# Patient Record
Sex: Male | Born: 1950 | Race: White | Hispanic: No | Marital: Married | Smoking: Never smoker
Health system: Southern US, Community
[De-identification: ages and names within clinical notes are randomized; demographics above are authoritative.]

## PROBLEM LIST (undated history)

## (undated) DIAGNOSIS — D689 Coagulation defect, unspecified: Secondary | ICD-10-CM

## (undated) DIAGNOSIS — Z9889 Other specified postprocedural states: Secondary | ICD-10-CM

## (undated) DIAGNOSIS — I10 Essential (primary) hypertension: Secondary | ICD-10-CM

## (undated) DIAGNOSIS — M1990 Unspecified osteoarthritis, unspecified site: Secondary | ICD-10-CM

## (undated) DIAGNOSIS — G4730 Sleep apnea, unspecified: Secondary | ICD-10-CM

## (undated) DIAGNOSIS — H269 Unspecified cataract: Secondary | ICD-10-CM

## (undated) DIAGNOSIS — C9000 Multiple myeloma not having achieved remission: Secondary | ICD-10-CM

## (undated) DIAGNOSIS — I4891 Unspecified atrial fibrillation: Secondary | ICD-10-CM

## (undated) DIAGNOSIS — I454 Nonspecific intraventricular block: Secondary | ICD-10-CM

## (undated) DIAGNOSIS — I951 Orthostatic hypotension: Secondary | ICD-10-CM

## (undated) DIAGNOSIS — R112 Nausea with vomiting, unspecified: Secondary | ICD-10-CM

## (undated) HISTORY — DX: Unspecified cataract: H26.9

## (undated) HISTORY — PX: WRIST RECONSTRUCTION: SHX2675

## (undated) HISTORY — DX: Nonspecific intraventricular block: I45.4

## (undated) HISTORY — DX: Unspecified atrial fibrillation: I48.91

## (undated) HISTORY — DX: Orthostatic hypotension: I95.1

## (undated) HISTORY — DX: Sleep apnea, unspecified: G47.30

## (undated) HISTORY — PX: FOOT SURGERY: SHX648

## (undated) HISTORY — PX: CHOLECYSTECTOMY: SHX55

## (undated) HISTORY — DX: Multiple myeloma not having achieved remission: C90.00

## (undated) HISTORY — DX: Nausea with vomiting, unspecified: R11.2

## (undated) HISTORY — PX: KNEE SURGERY: SHX244

## (undated) HISTORY — DX: Other specified postprocedural states: Z98.890

## (undated) HISTORY — PX: WISDOM TOOTH EXTRACTION: SHX21

## (undated) HISTORY — PX: CATARACT EXTRACTION, BILATERAL: SHX1313

## (undated) HISTORY — DX: Essential (primary) hypertension: I10

## (undated) HISTORY — DX: Unspecified osteoarthritis, unspecified site: M19.90

## (undated) HISTORY — DX: Coagulation defect, unspecified: D68.9

## (undated) HISTORY — PX: HAMMER TOE SURGERY: SHX385

## (undated) HISTORY — PX: LAPAROSCOPIC GASTRIC BANDING: SHX1100

---

## 2015-01-25 ENCOUNTER — Other Ambulatory Visit: Payer: Self-pay | Admitting: Physician Assistant

## 2015-01-25 ENCOUNTER — Ambulatory Visit
Admission: RE | Admit: 2015-01-25 | Discharge: 2015-01-25 | Disposition: A | Discharge status: Home / Self Care | Payer: 59 | Source: Ambulatory Visit | Attending: Physician Assistant | Admitting: Physician Assistant

## 2015-01-25 DIAGNOSIS — Z9884 Bariatric surgery status: Secondary | ICD-10-CM

## 2015-11-04 HISTORY — PX: POLYPECTOMY: SHX149

## 2015-11-04 HISTORY — PX: COLONOSCOPY: SHX174

## 2016-04-25 DIAGNOSIS — E782 Mixed hyperlipidemia: Secondary | ICD-10-CM | POA: Diagnosis not present

## 2016-04-25 DIAGNOSIS — I10 Essential (primary) hypertension: Secondary | ICD-10-CM | POA: Diagnosis not present

## 2016-04-25 DIAGNOSIS — E038 Other specified hypothyroidism: Secondary | ICD-10-CM | POA: Diagnosis not present

## 2016-04-25 DIAGNOSIS — Z125 Encounter for screening for malignant neoplasm of prostate: Secondary | ICD-10-CM | POA: Diagnosis not present

## 2016-04-25 DIAGNOSIS — Z1211 Encounter for screening for malignant neoplasm of colon: Secondary | ICD-10-CM | POA: Diagnosis not present

## 2016-04-25 DIAGNOSIS — Z1389 Encounter for screening for other disorder: Secondary | ICD-10-CM | POA: Diagnosis not present

## 2016-04-25 DIAGNOSIS — R7301 Impaired fasting glucose: Secondary | ICD-10-CM | POA: Diagnosis not present

## 2016-04-25 DIAGNOSIS — E559 Vitamin D deficiency, unspecified: Secondary | ICD-10-CM | POA: Diagnosis not present

## 2016-04-25 DIAGNOSIS — D518 Other vitamin B12 deficiency anemias: Secondary | ICD-10-CM | POA: Diagnosis not present

## 2016-06-19 DIAGNOSIS — Z1211 Encounter for screening for malignant neoplasm of colon: Secondary | ICD-10-CM | POA: Diagnosis not present

## 2016-06-19 DIAGNOSIS — Z800 Family history of malignant neoplasm of digestive organs: Secondary | ICD-10-CM | POA: Diagnosis not present

## 2016-06-19 DIAGNOSIS — Z86010 Personal history of colonic polyps: Secondary | ICD-10-CM | POA: Diagnosis not present

## 2016-06-30 DIAGNOSIS — D123 Benign neoplasm of transverse colon: Secondary | ICD-10-CM | POA: Diagnosis not present

## 2016-06-30 DIAGNOSIS — E669 Obesity, unspecified: Secondary | ICD-10-CM | POA: Diagnosis not present

## 2016-06-30 DIAGNOSIS — Z1211 Encounter for screening for malignant neoplasm of colon: Secondary | ICD-10-CM | POA: Diagnosis not present

## 2016-06-30 DIAGNOSIS — D128 Benign neoplasm of rectum: Secondary | ICD-10-CM | POA: Diagnosis not present

## 2016-06-30 DIAGNOSIS — Z86010 Personal history of colonic polyps: Secondary | ICD-10-CM | POA: Diagnosis not present

## 2016-06-30 DIAGNOSIS — K5730 Diverticulosis of large intestine without perforation or abscess without bleeding: Secondary | ICD-10-CM | POA: Diagnosis not present

## 2016-06-30 DIAGNOSIS — Z9049 Acquired absence of other specified parts of digestive tract: Secondary | ICD-10-CM | POA: Diagnosis not present

## 2016-06-30 DIAGNOSIS — Z6839 Body mass index (BMI) 39.0-39.9, adult: Secondary | ICD-10-CM | POA: Diagnosis not present

## 2016-06-30 DIAGNOSIS — D124 Benign neoplasm of descending colon: Secondary | ICD-10-CM | POA: Diagnosis not present

## 2016-06-30 DIAGNOSIS — Z800 Family history of malignant neoplasm of digestive organs: Secondary | ICD-10-CM | POA: Diagnosis not present

## 2016-06-30 DIAGNOSIS — Z79899 Other long term (current) drug therapy: Secondary | ICD-10-CM | POA: Diagnosis not present

## 2016-06-30 DIAGNOSIS — K648 Other hemorrhoids: Secondary | ICD-10-CM | POA: Diagnosis not present

## 2016-08-24 DIAGNOSIS — J189 Pneumonia, unspecified organism: Secondary | ICD-10-CM | POA: Diagnosis not present

## 2016-08-24 DIAGNOSIS — R0602 Shortness of breath: Secondary | ICD-10-CM | POA: Diagnosis not present

## 2016-08-24 DIAGNOSIS — H6123 Impacted cerumen, bilateral: Secondary | ICD-10-CM | POA: Diagnosis not present

## 2016-08-24 DIAGNOSIS — J069 Acute upper respiratory infection, unspecified: Secondary | ICD-10-CM | POA: Diagnosis not present

## 2016-09-01 DIAGNOSIS — F331 Major depressive disorder, recurrent, moderate: Secondary | ICD-10-CM | POA: Diagnosis not present

## 2016-09-01 DIAGNOSIS — E782 Mixed hyperlipidemia: Secondary | ICD-10-CM | POA: Diagnosis not present

## 2016-09-01 DIAGNOSIS — D518 Other vitamin B12 deficiency anemias: Secondary | ICD-10-CM | POA: Diagnosis not present

## 2016-09-01 DIAGNOSIS — F411 Generalized anxiety disorder: Secondary | ICD-10-CM | POA: Diagnosis not present

## 2016-09-01 DIAGNOSIS — E038 Other specified hypothyroidism: Secondary | ICD-10-CM | POA: Diagnosis not present

## 2016-09-01 DIAGNOSIS — I10 Essential (primary) hypertension: Secondary | ICD-10-CM | POA: Diagnosis not present

## 2016-09-01 DIAGNOSIS — E559 Vitamin D deficiency, unspecified: Secondary | ICD-10-CM | POA: Diagnosis not present

## 2016-09-01 DIAGNOSIS — E291 Testicular hypofunction: Secondary | ICD-10-CM | POA: Diagnosis not present

## 2016-09-01 DIAGNOSIS — Z0000 Encounter for general adult medical examination without abnormal findings: Secondary | ICD-10-CM | POA: Diagnosis not present

## 2016-09-01 DIAGNOSIS — Z79899 Other long term (current) drug therapy: Secondary | ICD-10-CM | POA: Diagnosis not present

## 2016-09-15 DIAGNOSIS — R972 Elevated prostate specific antigen [PSA]: Secondary | ICD-10-CM | POA: Diagnosis not present

## 2016-09-15 DIAGNOSIS — N3000 Acute cystitis without hematuria: Secondary | ICD-10-CM | POA: Diagnosis not present

## 2016-09-18 DIAGNOSIS — R972 Elevated prostate specific antigen [PSA]: Secondary | ICD-10-CM | POA: Diagnosis not present

## 2016-09-29 DIAGNOSIS — R972 Elevated prostate specific antigen [PSA]: Secondary | ICD-10-CM | POA: Diagnosis not present

## 2016-12-22 DIAGNOSIS — I10 Essential (primary) hypertension: Secondary | ICD-10-CM | POA: Diagnosis not present

## 2016-12-22 DIAGNOSIS — F331 Major depressive disorder, recurrent, moderate: Secondary | ICD-10-CM | POA: Diagnosis not present

## 2016-12-22 DIAGNOSIS — D518 Other vitamin B12 deficiency anemias: Secondary | ICD-10-CM | POA: Diagnosis not present

## 2016-12-22 DIAGNOSIS — E782 Mixed hyperlipidemia: Secondary | ICD-10-CM | POA: Diagnosis not present

## 2017-02-06 DIAGNOSIS — Z0000 Encounter for general adult medical examination without abnormal findings: Secondary | ICD-10-CM | POA: Diagnosis not present

## 2017-02-06 DIAGNOSIS — D518 Other vitamin B12 deficiency anemias: Secondary | ICD-10-CM | POA: Diagnosis not present

## 2017-02-06 DIAGNOSIS — E038 Other specified hypothyroidism: Secondary | ICD-10-CM | POA: Diagnosis not present

## 2017-02-06 DIAGNOSIS — E291 Testicular hypofunction: Secondary | ICD-10-CM | POA: Diagnosis not present

## 2017-02-06 DIAGNOSIS — E782 Mixed hyperlipidemia: Secondary | ICD-10-CM | POA: Diagnosis not present

## 2017-02-06 DIAGNOSIS — E559 Vitamin D deficiency, unspecified: Secondary | ICD-10-CM | POA: Diagnosis not present

## 2017-02-06 DIAGNOSIS — Z79899 Other long term (current) drug therapy: Secondary | ICD-10-CM | POA: Diagnosis not present

## 2017-02-06 DIAGNOSIS — R0989 Other specified symptoms and signs involving the circulatory and respiratory systems: Secondary | ICD-10-CM | POA: Diagnosis not present

## 2017-02-06 DIAGNOSIS — R609 Edema, unspecified: Secondary | ICD-10-CM | POA: Diagnosis not present

## 2017-02-06 DIAGNOSIS — I10 Essential (primary) hypertension: Secondary | ICD-10-CM | POA: Diagnosis not present

## 2017-02-06 DIAGNOSIS — D690 Allergic purpura: Secondary | ICD-10-CM | POA: Diagnosis not present

## 2017-02-16 DIAGNOSIS — I10 Essential (primary) hypertension: Secondary | ICD-10-CM | POA: Diagnosis not present

## 2017-02-16 DIAGNOSIS — E782 Mixed hyperlipidemia: Secondary | ICD-10-CM | POA: Diagnosis not present

## 2017-02-16 DIAGNOSIS — F331 Major depressive disorder, recurrent, moderate: Secondary | ICD-10-CM | POA: Diagnosis not present

## 2017-02-16 DIAGNOSIS — E291 Testicular hypofunction: Secondary | ICD-10-CM | POA: Diagnosis not present

## 2017-03-20 DIAGNOSIS — N390 Urinary tract infection, site not specified: Secondary | ICD-10-CM | POA: Diagnosis not present

## 2017-03-20 DIAGNOSIS — E2749 Other adrenocortical insufficiency: Secondary | ICD-10-CM | POA: Diagnosis not present

## 2017-03-20 DIAGNOSIS — N401 Enlarged prostate with lower urinary tract symptoms: Secondary | ICD-10-CM | POA: Diagnosis not present

## 2017-03-20 DIAGNOSIS — E291 Testicular hypofunction: Secondary | ICD-10-CM | POA: Diagnosis not present

## 2017-03-26 DIAGNOSIS — E2740 Unspecified adrenocortical insufficiency: Secondary | ICD-10-CM | POA: Diagnosis not present

## 2017-03-26 DIAGNOSIS — Z79899 Other long term (current) drug therapy: Secondary | ICD-10-CM | POA: Diagnosis not present

## 2017-03-31 DIAGNOSIS — E782 Mixed hyperlipidemia: Secondary | ICD-10-CM | POA: Diagnosis not present

## 2017-03-31 DIAGNOSIS — I10 Essential (primary) hypertension: Secondary | ICD-10-CM | POA: Diagnosis not present

## 2017-03-31 DIAGNOSIS — D518 Other vitamin B12 deficiency anemias: Secondary | ICD-10-CM | POA: Diagnosis not present

## 2017-03-31 DIAGNOSIS — F331 Major depressive disorder, recurrent, moderate: Secondary | ICD-10-CM | POA: Diagnosis not present

## 2017-04-10 DIAGNOSIS — F419 Anxiety disorder, unspecified: Secondary | ICD-10-CM | POA: Diagnosis not present

## 2017-04-10 DIAGNOSIS — R5383 Other fatigue: Secondary | ICD-10-CM | POA: Diagnosis not present

## 2017-04-10 DIAGNOSIS — I5021 Acute systolic (congestive) heart failure: Secondary | ICD-10-CM | POA: Diagnosis not present

## 2017-05-18 DIAGNOSIS — N390 Urinary tract infection, site not specified: Secondary | ICD-10-CM | POA: Diagnosis not present

## 2017-05-18 DIAGNOSIS — R300 Dysuria: Secondary | ICD-10-CM | POA: Diagnosis not present

## 2017-05-18 DIAGNOSIS — I10 Essential (primary) hypertension: Secondary | ICD-10-CM | POA: Diagnosis not present

## 2017-05-20 DIAGNOSIS — E221 Hyperprolactinemia: Secondary | ICD-10-CM | POA: Diagnosis not present

## 2017-06-02 DIAGNOSIS — D352 Benign neoplasm of pituitary gland: Secondary | ICD-10-CM | POA: Insufficient documentation

## 2017-06-02 DIAGNOSIS — E221 Hyperprolactinemia: Secondary | ICD-10-CM

## 2017-06-02 HISTORY — DX: Benign neoplasm of pituitary gland: D35.2

## 2017-06-02 HISTORY — DX: Hyperprolactinemia: E22.1

## 2017-06-05 DIAGNOSIS — F419 Anxiety disorder, unspecified: Secondary | ICD-10-CM | POA: Diagnosis not present

## 2017-06-05 DIAGNOSIS — R41840 Attention and concentration deficit: Secondary | ICD-10-CM | POA: Diagnosis not present

## 2017-06-05 DIAGNOSIS — M545 Low back pain: Secondary | ICD-10-CM | POA: Diagnosis not present

## 2017-06-05 DIAGNOSIS — E782 Mixed hyperlipidemia: Secondary | ICD-10-CM | POA: Diagnosis not present

## 2017-06-05 DIAGNOSIS — F332 Major depressive disorder, recurrent severe without psychotic features: Secondary | ICD-10-CM | POA: Diagnosis not present

## 2017-06-05 DIAGNOSIS — F411 Generalized anxiety disorder: Secondary | ICD-10-CM | POA: Diagnosis not present

## 2017-06-05 DIAGNOSIS — I10 Essential (primary) hypertension: Secondary | ICD-10-CM | POA: Diagnosis not present

## 2017-06-05 DIAGNOSIS — F331 Major depressive disorder, recurrent, moderate: Secondary | ICD-10-CM | POA: Diagnosis not present

## 2017-07-03 DIAGNOSIS — E559 Vitamin D deficiency, unspecified: Secondary | ICD-10-CM | POA: Diagnosis not present

## 2017-07-03 DIAGNOSIS — E2740 Unspecified adrenocortical insufficiency: Secondary | ICD-10-CM | POA: Diagnosis not present

## 2017-07-03 DIAGNOSIS — D518 Other vitamin B12 deficiency anemias: Secondary | ICD-10-CM | POA: Diagnosis not present

## 2017-07-03 DIAGNOSIS — Z79899 Other long term (current) drug therapy: Secondary | ICD-10-CM | POA: Diagnosis not present

## 2017-07-03 DIAGNOSIS — F331 Major depressive disorder, recurrent, moderate: Secondary | ICD-10-CM | POA: Diagnosis not present

## 2017-07-03 DIAGNOSIS — E038 Other specified hypothyroidism: Secondary | ICD-10-CM | POA: Diagnosis not present

## 2017-07-03 DIAGNOSIS — E291 Testicular hypofunction: Secondary | ICD-10-CM | POA: Diagnosis not present

## 2017-07-03 DIAGNOSIS — E782 Mixed hyperlipidemia: Secondary | ICD-10-CM | POA: Diagnosis not present

## 2017-07-03 DIAGNOSIS — I10 Essential (primary) hypertension: Secondary | ICD-10-CM | POA: Diagnosis not present

## 2017-08-19 DIAGNOSIS — I10 Essential (primary) hypertension: Secondary | ICD-10-CM | POA: Diagnosis not present

## 2017-08-19 DIAGNOSIS — E2740 Unspecified adrenocortical insufficiency: Secondary | ICD-10-CM | POA: Diagnosis not present

## 2017-08-19 DIAGNOSIS — J069 Acute upper respiratory infection, unspecified: Secondary | ICD-10-CM | POA: Diagnosis not present

## 2017-08-19 DIAGNOSIS — E782 Mixed hyperlipidemia: Secondary | ICD-10-CM | POA: Diagnosis not present

## 2017-08-19 DIAGNOSIS — N3281 Overactive bladder: Secondary | ICD-10-CM | POA: Diagnosis not present

## 2017-08-19 DIAGNOSIS — R070 Pain in throat: Secondary | ICD-10-CM | POA: Diagnosis not present

## 2017-08-19 DIAGNOSIS — R05 Cough: Secondary | ICD-10-CM | POA: Diagnosis not present

## 2017-08-27 DIAGNOSIS — J209 Acute bronchitis, unspecified: Secondary | ICD-10-CM | POA: Diagnosis not present

## 2017-08-27 DIAGNOSIS — J3089 Other allergic rhinitis: Secondary | ICD-10-CM | POA: Diagnosis not present

## 2017-08-27 DIAGNOSIS — R062 Wheezing: Secondary | ICD-10-CM | POA: Diagnosis not present

## 2017-08-27 DIAGNOSIS — J069 Acute upper respiratory infection, unspecified: Secondary | ICD-10-CM | POA: Diagnosis not present

## 2017-09-30 DIAGNOSIS — H2511 Age-related nuclear cataract, right eye: Secondary | ICD-10-CM | POA: Diagnosis not present

## 2017-09-30 DIAGNOSIS — H4020X0 Unspecified primary angle-closure glaucoma, stage unspecified: Secondary | ICD-10-CM | POA: Diagnosis not present

## 2017-10-08 DIAGNOSIS — H2511 Age-related nuclear cataract, right eye: Secondary | ICD-10-CM | POA: Diagnosis not present

## 2017-10-08 DIAGNOSIS — H25811 Combined forms of age-related cataract, right eye: Secondary | ICD-10-CM | POA: Diagnosis not present

## 2017-10-14 DIAGNOSIS — I10 Essential (primary) hypertension: Secondary | ICD-10-CM | POA: Diagnosis not present

## 2017-10-14 DIAGNOSIS — E782 Mixed hyperlipidemia: Secondary | ICD-10-CM | POA: Diagnosis not present

## 2017-10-14 DIAGNOSIS — N401 Enlarged prostate with lower urinary tract symptoms: Secondary | ICD-10-CM | POA: Diagnosis not present

## 2017-10-14 DIAGNOSIS — E291 Testicular hypofunction: Secondary | ICD-10-CM | POA: Diagnosis not present

## 2017-10-21 DIAGNOSIS — H25812 Combined forms of age-related cataract, left eye: Secondary | ICD-10-CM | POA: Diagnosis not present

## 2017-10-21 DIAGNOSIS — H2512 Age-related nuclear cataract, left eye: Secondary | ICD-10-CM | POA: Diagnosis not present

## 2017-11-24 DIAGNOSIS — N3281 Overactive bladder: Secondary | ICD-10-CM | POA: Diagnosis not present

## 2017-11-24 DIAGNOSIS — J069 Acute upper respiratory infection, unspecified: Secondary | ICD-10-CM | POA: Diagnosis not present

## 2017-11-24 DIAGNOSIS — E782 Mixed hyperlipidemia: Secondary | ICD-10-CM | POA: Diagnosis not present

## 2017-11-24 DIAGNOSIS — I10 Essential (primary) hypertension: Secondary | ICD-10-CM | POA: Diagnosis not present

## 2017-11-24 DIAGNOSIS — E2740 Unspecified adrenocortical insufficiency: Secondary | ICD-10-CM | POA: Diagnosis not present

## 2017-12-14 DIAGNOSIS — E2740 Unspecified adrenocortical insufficiency: Secondary | ICD-10-CM | POA: Diagnosis not present

## 2017-12-14 DIAGNOSIS — I10 Essential (primary) hypertension: Secondary | ICD-10-CM | POA: Diagnosis not present

## 2017-12-14 DIAGNOSIS — N3281 Overactive bladder: Secondary | ICD-10-CM | POA: Diagnosis not present

## 2017-12-14 DIAGNOSIS — E782 Mixed hyperlipidemia: Secondary | ICD-10-CM | POA: Diagnosis not present

## 2018-02-19 ENCOUNTER — Other Ambulatory Visit: Payer: Self-pay | Admitting: Surgery

## 2018-02-19 DIAGNOSIS — Z9884 Bariatric surgery status: Secondary | ICD-10-CM

## 2018-02-23 ENCOUNTER — Ambulatory Visit
Admission: RE | Admit: 2018-02-23 | Discharge: 2018-02-23 | Disposition: A | Discharge status: Home / Self Care | Payer: PPO | Source: Ambulatory Visit | Attending: Surgery | Admitting: Surgery

## 2018-02-23 DIAGNOSIS — Z9884 Bariatric surgery status: Secondary | ICD-10-CM

## 2018-02-23 DIAGNOSIS — K219 Gastro-esophageal reflux disease without esophagitis: Secondary | ICD-10-CM | POA: Diagnosis not present

## 2018-03-12 DIAGNOSIS — E611 Iron deficiency: Secondary | ICD-10-CM | POA: Diagnosis not present

## 2018-03-12 DIAGNOSIS — E782 Mixed hyperlipidemia: Secondary | ICD-10-CM | POA: Diagnosis not present

## 2018-03-12 DIAGNOSIS — Z79899 Other long term (current) drug therapy: Secondary | ICD-10-CM | POA: Diagnosis not present

## 2018-03-12 DIAGNOSIS — E559 Vitamin D deficiency, unspecified: Secondary | ICD-10-CM | POA: Diagnosis not present

## 2018-03-12 DIAGNOSIS — I10 Essential (primary) hypertension: Secondary | ICD-10-CM | POA: Diagnosis not present

## 2018-03-12 DIAGNOSIS — K219 Gastro-esophageal reflux disease without esophagitis: Secondary | ICD-10-CM | POA: Diagnosis not present

## 2018-03-25 DIAGNOSIS — L0291 Cutaneous abscess, unspecified: Secondary | ICD-10-CM | POA: Diagnosis not present

## 2018-04-01 DIAGNOSIS — I10 Essential (primary) hypertension: Secondary | ICD-10-CM | POA: Diagnosis not present

## 2018-04-01 DIAGNOSIS — L0291 Cutaneous abscess, unspecified: Secondary | ICD-10-CM | POA: Diagnosis not present

## 2018-04-01 DIAGNOSIS — E2740 Unspecified adrenocortical insufficiency: Secondary | ICD-10-CM | POA: Diagnosis not present

## 2018-04-01 DIAGNOSIS — E782 Mixed hyperlipidemia: Secondary | ICD-10-CM | POA: Diagnosis not present

## 2018-05-12 DIAGNOSIS — I10 Essential (primary) hypertension: Secondary | ICD-10-CM | POA: Diagnosis not present

## 2018-05-12 DIAGNOSIS — E559 Vitamin D deficiency, unspecified: Secondary | ICD-10-CM | POA: Diagnosis not present

## 2018-05-12 DIAGNOSIS — E611 Iron deficiency: Secondary | ICD-10-CM | POA: Diagnosis not present

## 2018-05-12 DIAGNOSIS — E782 Mixed hyperlipidemia: Secondary | ICD-10-CM | POA: Diagnosis not present

## 2018-06-09 DIAGNOSIS — Z0000 Encounter for general adult medical examination without abnormal findings: Secondary | ICD-10-CM | POA: Diagnosis not present

## 2018-06-09 DIAGNOSIS — E038 Other specified hypothyroidism: Secondary | ICD-10-CM | POA: Diagnosis not present

## 2018-06-09 DIAGNOSIS — E782 Mixed hyperlipidemia: Secondary | ICD-10-CM | POA: Diagnosis not present

## 2018-06-09 DIAGNOSIS — N401 Enlarged prostate with lower urinary tract symptoms: Secondary | ICD-10-CM | POA: Diagnosis not present

## 2018-06-09 DIAGNOSIS — R7303 Prediabetes: Secondary | ICD-10-CM | POA: Diagnosis not present

## 2018-06-09 DIAGNOSIS — Z1211 Encounter for screening for malignant neoplasm of colon: Secondary | ICD-10-CM | POA: Diagnosis not present

## 2018-06-09 DIAGNOSIS — Z79899 Other long term (current) drug therapy: Secondary | ICD-10-CM | POA: Diagnosis not present

## 2018-06-09 DIAGNOSIS — N3281 Overactive bladder: Secondary | ICD-10-CM | POA: Diagnosis not present

## 2018-06-09 DIAGNOSIS — D518 Other vitamin B12 deficiency anemias: Secondary | ICD-10-CM | POA: Diagnosis not present

## 2018-06-09 DIAGNOSIS — E611 Iron deficiency: Secondary | ICD-10-CM | POA: Diagnosis not present

## 2018-06-09 DIAGNOSIS — F331 Major depressive disorder, recurrent, moderate: Secondary | ICD-10-CM | POA: Diagnosis not present

## 2018-06-09 DIAGNOSIS — E559 Vitamin D deficiency, unspecified: Secondary | ICD-10-CM | POA: Diagnosis not present

## 2018-06-16 DIAGNOSIS — N400 Enlarged prostate without lower urinary tract symptoms: Secondary | ICD-10-CM | POA: Diagnosis not present

## 2018-06-20 DIAGNOSIS — Z888 Allergy status to other drugs, medicaments and biological substances status: Secondary | ICD-10-CM | POA: Diagnosis not present

## 2018-06-20 DIAGNOSIS — Z6841 Body Mass Index (BMI) 40.0 and over, adult: Secondary | ICD-10-CM | POA: Diagnosis not present

## 2018-06-20 DIAGNOSIS — Z8701 Personal history of pneumonia (recurrent): Secondary | ICD-10-CM | POA: Diagnosis not present

## 2018-06-20 DIAGNOSIS — J45909 Unspecified asthma, uncomplicated: Secondary | ICD-10-CM | POA: Diagnosis not present

## 2018-06-20 DIAGNOSIS — M1990 Unspecified osteoarthritis, unspecified site: Secondary | ICD-10-CM | POA: Diagnosis not present

## 2018-06-20 DIAGNOSIS — I10 Essential (primary) hypertension: Secondary | ICD-10-CM | POA: Diagnosis not present

## 2018-06-20 DIAGNOSIS — F419 Anxiety disorder, unspecified: Secondary | ICD-10-CM | POA: Diagnosis not present

## 2018-06-20 DIAGNOSIS — R072 Precordial pain: Secondary | ICD-10-CM | POA: Diagnosis not present

## 2018-06-20 DIAGNOSIS — Z79899 Other long term (current) drug therapy: Secondary | ICD-10-CM | POA: Diagnosis not present

## 2018-06-20 DIAGNOSIS — R079 Chest pain, unspecified: Secondary | ICD-10-CM | POA: Diagnosis not present

## 2018-06-21 DIAGNOSIS — R079 Chest pain, unspecified: Secondary | ICD-10-CM | POA: Diagnosis not present

## 2018-06-24 ENCOUNTER — Other Ambulatory Visit: Payer: Self-pay | Admitting: *Deleted

## 2018-06-24 NOTE — Patient Outreach (Signed)
[place] [facility]) Care Management  06/24/2018  Lance Pham [birth year] [id number]  Transition of Care Referral   Referral Date: 06/24/18 Referral Source: hta urgent outreach for toc  Date of Admission: unknown Diagnosis: referral states chest pain , unspecified Date of Discharge: 06/21/18 [facility]: [facility] Insurance: HTA  Outreach attempt # 1 No answer. [facility] RN CM left HIPAA compliant voicemail message along with CM's contact info.    Plan [facility] RN CM sent an unsuccessful outreach letter and scheduled this patient for another call attempt within 4 business days  [name], RN, BSN, [place] Coordinator Office number [phone] Mobile number [phone]  Main [facility] number [phone] Fax number [phone]

## 2018-06-28 ENCOUNTER — Ambulatory Visit: Payer: Self-pay | Admitting: *Deleted

## 2018-06-29 ENCOUNTER — Other Ambulatory Visit: Payer: Self-pay | Admitting: *Deleted

## 2018-06-29 NOTE — Patient Outreach (Signed)
[facility]) Care Management  06/29/2018  DOMONICK SITTNER 12-31-1950 [id number]   Transition of Care Referral  Referral Date: 06/24/18 Referral Source: hta urgent outreach for toc  Date of Admission: unknown Diagnosis: referral states chest pain , unspecified Date of Discharge: 06/21/18 [facility]: [facility] health Insurance: HTA  Outreach attempt # 2 No answer. [facility] RN CM left HIPAA compliant voicemail message along with CM's contact info.    Plan [facility] RN CM scheduled this patient for another call attempt within 4 business days  Simran Bomkamp L. [name], RN, BSN, [place] Coordinator Office number [phone] Mobile number [phone]  Main [facility] number [phone] Fax number [phone]

## 2018-06-30 DIAGNOSIS — Z0000 Encounter for general adult medical examination without abnormal findings: Secondary | ICD-10-CM | POA: Diagnosis not present

## 2018-06-30 DIAGNOSIS — E559 Vitamin D deficiency, unspecified: Secondary | ICD-10-CM | POA: Diagnosis not present

## 2018-06-30 DIAGNOSIS — F5081 Binge eating disorder: Secondary | ICD-10-CM | POA: Diagnosis not present

## 2018-06-30 DIAGNOSIS — R0789 Other chest pain: Secondary | ICD-10-CM | POA: Diagnosis not present

## 2018-06-30 DIAGNOSIS — E611 Iron deficiency: Secondary | ICD-10-CM | POA: Diagnosis not present

## 2018-07-02 ENCOUNTER — Other Ambulatory Visit: Payer: Self-pay | Admitting: *Deleted

## 2018-07-02 NOTE — Patient Outreach (Signed)
[place] [facility]) Care Management  07/02/2018  Lance Pham 03-22-1951 [id number]   Transition of Care Referral  Referral Date:06/24/18 Referral Source:hta urgent outreach for toc Date of Admission:unknown Diagnosis:referral states chest pain , unspecified Date of Discharge:06/21/18 Facility: health Insurance:HTA  Outreach attempt #3 No answer. [facility] RN CM left HIPAA compliant voicemail message along with CM's contact info.   [facility] RN CM scheduled this patient for case closure per workflow if no response  [name] L. [name], RN, BSN, [place] Coordinator Office number [phone] Mobile number [phone]  Main [facility] number [phone] Fax number [phone]

## 2018-07-08 ENCOUNTER — Other Ambulatory Visit: Payer: Self-pay | Admitting: *Deleted

## 2018-07-08 NOTE — Patient Outreach (Signed)
[place] [facility]) Care Management  07/08/2018  Lance Pham [birth year] [id number]   Case closure   Call attempts made on 06/28/18, 06/29/18 and 07/02/18 Unsuccessful outreach letter sent on 06/28/18 without a response   Plan [facility] RN CM will close case after no response from patient within 10 business days. Unable to reach Case closure letter sent to [facility] patient and there is not a listed MD in Epic to send a letter to   [place]. [name], RN, BSN, [place] Coordinator Office number [phone] Mobile number [phone]  Main [facility] number [phone] Fax number [phone]

## 2018-07-14 DIAGNOSIS — E785 Hyperlipidemia, unspecified: Secondary | ICD-10-CM | POA: Insufficient documentation

## 2018-07-14 DIAGNOSIS — I10 Essential (primary) hypertension: Secondary | ICD-10-CM

## 2018-07-14 DIAGNOSIS — R9439 Abnormal result of other cardiovascular function study: Secondary | ICD-10-CM

## 2018-07-14 DIAGNOSIS — R0789 Other chest pain: Secondary | ICD-10-CM

## 2018-07-14 HISTORY — DX: Essential (primary) hypertension: I10

## 2018-07-14 HISTORY — DX: Abnormal result of other cardiovascular function study: R94.39

## 2018-07-14 HISTORY — DX: Other chest pain: R07.89

## 2018-07-14 HISTORY — DX: Hyperlipidemia, unspecified: E78.5

## 2018-07-15 ENCOUNTER — Other Ambulatory Visit: Payer: Self-pay | Admitting: *Deleted

## 2018-07-15 NOTE — Patient Outreach (Signed)
[place] [facility]) Care Management  07/15/2018  Lance Pham Jul 06, 1951 [id number]   Transition of Care Referral  Referral Date: 06/24/18 Referral Source: hta urgent outreach for toc  Date of Admission: 06/20/18 Diagnosis: referral states chest pain , unspecified Date of Discharge: 06/21/18 [facility]: [facility] Insurance: HTA  Patient returned a call to [facility] RN CM  Patient is able to verify HIPAA Reviewed and addressed referral to [facility] with patient Lance Pham confirms his 1 day admission to [facility] hospital was actually found to be related to GERD but he was referred to a Cardiologist after a MRI and CT indicating some narrowing of a blood vessel  Social: Lance Pham confirms he is independent in his care, has no concerns with getting to MD appointments and has support of his family   Conditions: HTN, dyslipidemia, chest discomfort never a smoker or a drinker, hypogonadism Medications: no rx was given at discharge and he reports being on only a few meds - denies concerns with taking medications as prescribed, affording medications, side effects of medications and questions about medications  Appointments: He confirms he saw his primary care provider Dr Jannette Fogo after the hospitalization  Cardiology Dr Otho Perl seen on 07/14/18 but Lance Pham reports no problems no sob or cp He will f/u in 2 months with dr Otho Perl   Advance Directives: Denies need for assist with or assist with changes for advance directives   Consent: [facility] RN CM reviewed [facility] services with patient. He denies need of services from [facility]/Telephonic RN CM, pharmacy, health coach, NP or SW at this time He states he exercises, has changed his diet and has no questions    Plan [facility] RN CM will close case at this time as patient has been assessed and no needs identified.     Tia Hieronymus L. [name], RN, BSN, [place] Coordinator Office number [phone] Mobile number [phone]  Main [facility] number [phone] Fax number [phone]

## 2018-07-19 DIAGNOSIS — N401 Enlarged prostate with lower urinary tract symptoms: Secondary | ICD-10-CM | POA: Diagnosis not present

## 2018-07-19 DIAGNOSIS — R972 Elevated prostate specific antigen [PSA]: Secondary | ICD-10-CM | POA: Diagnosis not present

## 2018-07-19 DIAGNOSIS — N318 Other neuromuscular dysfunction of bladder: Secondary | ICD-10-CM | POA: Diagnosis not present

## 2018-07-19 DIAGNOSIS — N3020 Other chronic cystitis without hematuria: Secondary | ICD-10-CM | POA: Diagnosis not present

## 2018-08-05 DIAGNOSIS — R509 Fever, unspecified: Secondary | ICD-10-CM | POA: Diagnosis not present

## 2018-08-05 DIAGNOSIS — J069 Acute upper respiratory infection, unspecified: Secondary | ICD-10-CM | POA: Diagnosis not present

## 2018-09-01 DIAGNOSIS — N3020 Other chronic cystitis without hematuria: Secondary | ICD-10-CM | POA: Diagnosis not present

## 2018-09-01 DIAGNOSIS — N318 Other neuromuscular dysfunction of bladder: Secondary | ICD-10-CM | POA: Diagnosis not present

## 2018-09-01 DIAGNOSIS — R351 Nocturia: Secondary | ICD-10-CM | POA: Diagnosis not present

## 2018-09-01 DIAGNOSIS — N401 Enlarged prostate with lower urinary tract symptoms: Secondary | ICD-10-CM | POA: Diagnosis not present

## 2018-09-09 DIAGNOSIS — R351 Nocturia: Secondary | ICD-10-CM | POA: Diagnosis not present

## 2018-09-09 DIAGNOSIS — N401 Enlarged prostate with lower urinary tract symptoms: Secondary | ICD-10-CM | POA: Diagnosis not present

## 2018-09-17 DIAGNOSIS — R9439 Abnormal result of other cardiovascular function study: Secondary | ICD-10-CM | POA: Diagnosis not present

## 2018-09-17 DIAGNOSIS — E785 Hyperlipidemia, unspecified: Secondary | ICD-10-CM | POA: Diagnosis not present

## 2018-09-17 DIAGNOSIS — I10 Essential (primary) hypertension: Secondary | ICD-10-CM | POA: Diagnosis not present

## 2018-09-27 DIAGNOSIS — N401 Enlarged prostate with lower urinary tract symptoms: Secondary | ICD-10-CM | POA: Diagnosis not present

## 2018-09-27 DIAGNOSIS — N3020 Other chronic cystitis without hematuria: Secondary | ICD-10-CM | POA: Diagnosis not present

## 2018-09-27 DIAGNOSIS — N318 Other neuromuscular dysfunction of bladder: Secondary | ICD-10-CM | POA: Diagnosis not present

## 2018-09-27 DIAGNOSIS — R351 Nocturia: Secondary | ICD-10-CM | POA: Diagnosis not present

## 2018-11-01 DIAGNOSIS — I10 Essential (primary) hypertension: Secondary | ICD-10-CM | POA: Diagnosis not present

## 2018-11-01 DIAGNOSIS — E2740 Unspecified adrenocortical insufficiency: Secondary | ICD-10-CM | POA: Diagnosis not present

## 2018-11-01 DIAGNOSIS — N3281 Overactive bladder: Secondary | ICD-10-CM | POA: Diagnosis not present

## 2018-11-01 DIAGNOSIS — E782 Mixed hyperlipidemia: Secondary | ICD-10-CM | POA: Diagnosis not present

## 2018-11-10 DIAGNOSIS — J069 Acute upper respiratory infection, unspecified: Secondary | ICD-10-CM | POA: Diagnosis not present

## 2018-11-10 DIAGNOSIS — R350 Frequency of micturition: Secondary | ICD-10-CM | POA: Diagnosis not present

## 2018-11-10 DIAGNOSIS — R05 Cough: Secondary | ICD-10-CM | POA: Diagnosis not present

## 2018-11-10 DIAGNOSIS — R6883 Chills (without fever): Secondary | ICD-10-CM | POA: Diagnosis not present

## 2018-11-10 DIAGNOSIS — R531 Weakness: Secondary | ICD-10-CM | POA: Diagnosis not present

## 2018-11-10 DIAGNOSIS — R509 Fever, unspecified: Secondary | ICD-10-CM | POA: Diagnosis not present

## 2018-11-10 DIAGNOSIS — J189 Pneumonia, unspecified organism: Secondary | ICD-10-CM | POA: Diagnosis not present

## 2018-11-10 DIAGNOSIS — R062 Wheezing: Secondary | ICD-10-CM | POA: Diagnosis not present

## 2018-11-10 DIAGNOSIS — R1111 Vomiting without nausea: Secondary | ICD-10-CM | POA: Diagnosis not present

## 2018-11-10 DIAGNOSIS — R0689 Other abnormalities of breathing: Secondary | ICD-10-CM | POA: Diagnosis not present

## 2018-11-10 DIAGNOSIS — E860 Dehydration: Secondary | ICD-10-CM | POA: Diagnosis not present

## 2018-11-17 DIAGNOSIS — Z79899 Other long term (current) drug therapy: Secondary | ICD-10-CM | POA: Diagnosis not present

## 2018-11-17 DIAGNOSIS — J45909 Unspecified asthma, uncomplicated: Secondary | ICD-10-CM | POA: Diagnosis not present

## 2018-11-17 DIAGNOSIS — A419 Sepsis, unspecified organism: Secondary | ICD-10-CM | POA: Diagnosis not present

## 2018-11-17 DIAGNOSIS — R197 Diarrhea, unspecified: Secondary | ICD-10-CM | POA: Diagnosis not present

## 2018-11-17 DIAGNOSIS — F418 Other specified anxiety disorders: Secondary | ICD-10-CM | POA: Diagnosis not present

## 2018-11-17 DIAGNOSIS — N179 Acute kidney failure, unspecified: Secondary | ICD-10-CM | POA: Diagnosis not present

## 2018-11-17 DIAGNOSIS — R0602 Shortness of breath: Secondary | ICD-10-CM | POA: Diagnosis not present

## 2018-11-17 DIAGNOSIS — Z22322 Carrier or suspected carrier of Methicillin resistant Staphylococcus aureus: Secondary | ICD-10-CM | POA: Diagnosis not present

## 2018-11-17 DIAGNOSIS — I959 Hypotension, unspecified: Secondary | ICD-10-CM | POA: Diagnosis not present

## 2018-11-17 DIAGNOSIS — F419 Anxiety disorder, unspecified: Secondary | ICD-10-CM | POA: Diagnosis not present

## 2018-11-17 DIAGNOSIS — R791 Abnormal coagulation profile: Secondary | ICD-10-CM | POA: Diagnosis not present

## 2018-11-17 DIAGNOSIS — J189 Pneumonia, unspecified organism: Secondary | ICD-10-CM | POA: Diagnosis not present

## 2018-11-17 DIAGNOSIS — N400 Enlarged prostate without lower urinary tract symptoms: Secondary | ICD-10-CM | POA: Diagnosis not present

## 2018-11-17 DIAGNOSIS — Z2821 Immunization not carried out because of patient refusal: Secondary | ICD-10-CM | POA: Diagnosis not present

## 2018-11-17 DIAGNOSIS — I10 Essential (primary) hypertension: Secondary | ICD-10-CM | POA: Diagnosis not present

## 2018-11-17 DIAGNOSIS — R6520 Severe sepsis without septic shock: Secondary | ICD-10-CM | POA: Diagnosis not present

## 2018-11-17 DIAGNOSIS — J181 Lobar pneumonia, unspecified organism: Secondary | ICD-10-CM | POA: Diagnosis not present

## 2018-11-17 DIAGNOSIS — F329 Major depressive disorder, single episode, unspecified: Secondary | ICD-10-CM | POA: Diagnosis not present

## 2018-11-22 ENCOUNTER — Other Ambulatory Visit: Payer: Self-pay | Admitting: *Deleted

## 2018-11-22 NOTE — Patient Outreach (Addendum)
[place]) Care Management  11/22/2018  Lance Pham 1950-11-07 [id number]   Subjective: Telephone call to patient's home number, no answer, left HIPAA compliant voicemail message, and requested call back.     Objective: Per KPN (Knowledge Performance Now, point of care tool) and chart review, patient discharged from [facility] on 11/19/2018 per referral.   Patient also has a history of hypertension, dyslipidemia, and Pituitary microadenoma.   Patient closed to [place] on 07/08/18 due to unable to contact patient.     Assessment: Received [employer] transition of care referral on 11/22/2018.  Transition of care follow up pending patient contact.      Plan: RNCM will send unsuccessful outreach  letter, [facility] pamphlet, will call patient for 2nd telephone outreach attempt, transition of care follow up, and proceed with case closure, within 4 business days if no return call.       Elyanna Wallick H. Annia Friendly, BSN, [place] Management [facility] Telephonic CM Phone: [phone] Fax: [phone]

## 2018-11-23 ENCOUNTER — Other Ambulatory Visit: Payer: Self-pay | Admitting: *Deleted

## 2018-11-23 NOTE — Patient Outreach (Signed)
[place] [facility]) Care Management  11/23/2018  YOUNG MULVEY [birth year] [id number]   Subjective: Telephone call to patient's home number, no answer, left HIPAA compliant voicemail message, and requested call back.     Objective: Per KPN (Knowledge Performance Now, point of care tool) and chart review, patient discharged from [facility] on 11/19/2018 per referral.   Patient also has a history of hypertension, dyslipidemia, and Pituitary microadenoma.   Patient closed to [place] on 07/08/18 due to unable to contact patient.     Assessment: Received [employer] transition of care referral on 11/22/2018.  Transition of care follow up pending patient contact.      Plan: RNCM has sent unsuccessful outreach  letter, [facility] pamphlet, will call patient for 3rd telephone outreach attempt, transition of care follow up, and proceed with case closure, within 4 business days if no return call.      Petrice Beedy H. Annia Friendly, BSN, [place] Management [facility] Telephonic CM Phone: [phone] Fax: [phone]

## 2018-11-24 ENCOUNTER — Other Ambulatory Visit: Payer: Self-pay | Admitting: *Deleted

## 2018-11-24 NOTE — Patient Outreach (Signed)
[place] [facility]) Care Management  11/24/2018  Lance Pham [birth year] [id number]   Subjective:Telephone call to patient's home number, no answer, left HIPAA compliant voicemail message, and requested call back.     Objective:Per KPN (Knowledge Performance Now, point of care tool) and chart review,patient discharged from [facility] on 11/19/2018 per referral. Patient also has a history of hypertension, dyslipidemia, and Pituitary microadenoma.Patient closed to [place] on 07/08/18 due to unable to contact patient.     Assessment: Received [employer] transition of care referral on 11/22/2018.Transition of care follow up pending patient contact.     Plan:RNCM has sent unsuccessful outreach letter, [facility] pamphlet, and will proceed with case closure, within 10business days if no return call.      Rameen Gohlke H. Annia Friendly, BSN, [place] Management [facility] Telephonic CM Phone: [phone] Fax: [phone]

## 2018-11-26 DIAGNOSIS — J180 Bronchopneumonia, unspecified organism: Secondary | ICD-10-CM | POA: Diagnosis not present

## 2018-11-26 DIAGNOSIS — N401 Enlarged prostate with lower urinary tract symptoms: Secondary | ICD-10-CM | POA: Diagnosis not present

## 2018-11-26 DIAGNOSIS — I10 Essential (primary) hypertension: Secondary | ICD-10-CM | POA: Diagnosis not present

## 2018-11-26 DIAGNOSIS — R05 Cough: Secondary | ICD-10-CM | POA: Diagnosis not present

## 2018-11-26 DIAGNOSIS — E782 Mixed hyperlipidemia: Secondary | ICD-10-CM | POA: Diagnosis not present

## 2018-12-06 ENCOUNTER — Other Ambulatory Visit: Payer: Self-pay | Admitting: *Deleted

## 2018-12-06 NOTE — Patient Outreach (Addendum)
[facility] [facility]) Care Management  12/06/2018  Lance Pham 11/10/1950 [id number]   No response from patient outreach attempts will proceed with case closure.     Objective:Per KPN (Knowledge Performance Now, point of care tool) and chart review,patient discharged from [facility] on 11/19/2018 per referral. Patient also has a history of hypertension, dyslipidemia, and Pituitary microadenoma.Patient closed to [place] on 07/08/18 due to unable to contact patient.     Assessment: Received [employer] transition of care follow up referral on 11/22/2018.Transition of care follow up not completed due to unable to contact patient and will proceed with case closure.      Plan:Case closure due to unable to reach.  RNCM will send MD case closure letter.       Lance Pham H. Annia Friendly, BSN, [place] Management [facility] Telephonic CM Phone: [phone] Fax: [phone]

## 2019-01-25 DIAGNOSIS — J309 Allergic rhinitis, unspecified: Secondary | ICD-10-CM | POA: Diagnosis not present

## 2019-03-11 DIAGNOSIS — J189 Pneumonia, unspecified organism: Secondary | ICD-10-CM | POA: Diagnosis not present

## 2019-03-11 DIAGNOSIS — R509 Fever, unspecified: Secondary | ICD-10-CM | POA: Diagnosis not present

## 2019-03-11 DIAGNOSIS — R0602 Shortness of breath: Secondary | ICD-10-CM | POA: Diagnosis not present

## 2019-03-11 DIAGNOSIS — R05 Cough: Secondary | ICD-10-CM | POA: Diagnosis not present

## 2019-03-11 DIAGNOSIS — A78 Q fever: Secondary | ICD-10-CM | POA: Diagnosis not present

## 2019-03-22 DIAGNOSIS — R197 Diarrhea, unspecified: Secondary | ICD-10-CM | POA: Diagnosis not present

## 2019-03-22 DIAGNOSIS — J22 Unspecified acute lower respiratory infection: Secondary | ICD-10-CM | POA: Diagnosis not present

## 2019-03-23 DIAGNOSIS — R197 Diarrhea, unspecified: Secondary | ICD-10-CM | POA: Diagnosis not present

## 2019-04-04 DIAGNOSIS — R9439 Abnormal result of other cardiovascular function study: Secondary | ICD-10-CM | POA: Diagnosis not present

## 2019-04-04 DIAGNOSIS — E785 Hyperlipidemia, unspecified: Secondary | ICD-10-CM | POA: Diagnosis not present

## 2019-04-04 DIAGNOSIS — I10 Essential (primary) hypertension: Secondary | ICD-10-CM | POA: Diagnosis not present

## 2019-06-08 DIAGNOSIS — Z79899 Other long term (current) drug therapy: Secondary | ICD-10-CM | POA: Diagnosis not present

## 2019-06-08 DIAGNOSIS — Z9889 Other specified postprocedural states: Secondary | ICD-10-CM | POA: Diagnosis not present

## 2019-06-08 DIAGNOSIS — R0602 Shortness of breath: Secondary | ICD-10-CM

## 2019-06-08 DIAGNOSIS — E7849 Other hyperlipidemia: Secondary | ICD-10-CM | POA: Diagnosis not present

## 2019-06-08 DIAGNOSIS — R0789 Other chest pain: Secondary | ICD-10-CM | POA: Diagnosis not present

## 2019-06-08 DIAGNOSIS — I10 Essential (primary) hypertension: Secondary | ICD-10-CM | POA: Diagnosis not present

## 2019-06-08 HISTORY — DX: Shortness of breath: R06.02

## 2019-06-22 DIAGNOSIS — E7849 Other hyperlipidemia: Secondary | ICD-10-CM | POA: Diagnosis not present

## 2019-06-22 DIAGNOSIS — Z79899 Other long term (current) drug therapy: Secondary | ICD-10-CM | POA: Diagnosis not present

## 2019-06-22 DIAGNOSIS — I2510 Atherosclerotic heart disease of native coronary artery without angina pectoris: Secondary | ICD-10-CM

## 2019-06-22 DIAGNOSIS — I4510 Unspecified right bundle-branch block: Secondary | ICD-10-CM | POA: Diagnosis not present

## 2019-06-22 DIAGNOSIS — F410 Panic disorder [episodic paroxysmal anxiety] without agoraphobia: Secondary | ICD-10-CM | POA: Diagnosis not present

## 2019-06-22 DIAGNOSIS — I25118 Atherosclerotic heart disease of native coronary artery with other forms of angina pectoris: Secondary | ICD-10-CM | POA: Diagnosis not present

## 2019-06-22 DIAGNOSIS — I4891 Unspecified atrial fibrillation: Secondary | ICD-10-CM | POA: Diagnosis not present

## 2019-06-22 DIAGNOSIS — I480 Paroxysmal atrial fibrillation: Secondary | ICD-10-CM

## 2019-06-22 DIAGNOSIS — R0789 Other chest pain: Secondary | ICD-10-CM | POA: Diagnosis not present

## 2019-06-22 DIAGNOSIS — R9439 Abnormal result of other cardiovascular function study: Secondary | ICD-10-CM | POA: Diagnosis not present

## 2019-06-22 DIAGNOSIS — I444 Left anterior fascicular block: Secondary | ICD-10-CM | POA: Diagnosis not present

## 2019-06-22 DIAGNOSIS — I10 Essential (primary) hypertension: Secondary | ICD-10-CM | POA: Diagnosis not present

## 2019-06-22 DIAGNOSIS — I452 Bifascicular block: Secondary | ICD-10-CM | POA: Diagnosis not present

## 2019-06-22 DIAGNOSIS — R0602 Shortness of breath: Secondary | ICD-10-CM | POA: Diagnosis not present

## 2019-06-22 HISTORY — DX: Paroxysmal atrial fibrillation: I48.0

## 2019-06-22 HISTORY — DX: Atherosclerotic heart disease of native coronary artery without angina pectoris: I25.10

## 2019-06-23 DIAGNOSIS — I452 Bifascicular block: Secondary | ICD-10-CM | POA: Diagnosis not present

## 2019-06-23 DIAGNOSIS — E7849 Other hyperlipidemia: Secondary | ICD-10-CM | POA: Diagnosis not present

## 2019-06-23 DIAGNOSIS — R001 Bradycardia, unspecified: Secondary | ICD-10-CM | POA: Diagnosis not present

## 2019-06-23 DIAGNOSIS — I4510 Unspecified right bundle-branch block: Secondary | ICD-10-CM | POA: Diagnosis not present

## 2019-06-23 DIAGNOSIS — I4891 Unspecified atrial fibrillation: Secondary | ICD-10-CM | POA: Diagnosis not present

## 2019-06-23 DIAGNOSIS — I25118 Atherosclerotic heart disease of native coronary artery with other forms of angina pectoris: Secondary | ICD-10-CM | POA: Diagnosis not present

## 2019-06-23 DIAGNOSIS — I444 Left anterior fascicular block: Secondary | ICD-10-CM | POA: Diagnosis not present

## 2019-06-23 DIAGNOSIS — I10 Essential (primary) hypertension: Secondary | ICD-10-CM | POA: Diagnosis not present

## 2019-06-23 DIAGNOSIS — I480 Paroxysmal atrial fibrillation: Secondary | ICD-10-CM | POA: Diagnosis not present

## 2019-06-24 DIAGNOSIS — R9430 Abnormal result of cardiovascular function study, unspecified: Secondary | ICD-10-CM | POA: Diagnosis not present

## 2019-06-24 DIAGNOSIS — F410 Panic disorder [episodic paroxysmal anxiety] without agoraphobia: Secondary | ICD-10-CM | POA: Diagnosis not present

## 2019-06-24 DIAGNOSIS — I2510 Atherosclerotic heart disease of native coronary artery without angina pectoris: Secondary | ICD-10-CM | POA: Diagnosis not present

## 2019-06-24 DIAGNOSIS — I10 Essential (primary) hypertension: Secondary | ICD-10-CM | POA: Diagnosis not present

## 2019-06-24 DIAGNOSIS — I480 Paroxysmal atrial fibrillation: Secondary | ICD-10-CM | POA: Diagnosis not present

## 2019-07-20 DIAGNOSIS — I480 Paroxysmal atrial fibrillation: Secondary | ICD-10-CM | POA: Diagnosis not present

## 2019-07-20 DIAGNOSIS — E785 Hyperlipidemia, unspecified: Secondary | ICD-10-CM | POA: Diagnosis not present

## 2019-07-20 DIAGNOSIS — I10 Essential (primary) hypertension: Secondary | ICD-10-CM | POA: Diagnosis not present

## 2019-07-20 DIAGNOSIS — I2510 Atherosclerotic heart disease of native coronary artery without angina pectoris: Secondary | ICD-10-CM | POA: Diagnosis not present

## 2019-09-07 ENCOUNTER — Encounter: Payer: Self-pay | Admitting: Gastroenterology

## 2019-09-27 DIAGNOSIS — R5383 Other fatigue: Secondary | ICD-10-CM | POA: Diagnosis not present

## 2019-10-14 DIAGNOSIS — I4510 Unspecified right bundle-branch block: Secondary | ICD-10-CM | POA: Insufficient documentation

## 2019-10-14 DIAGNOSIS — I2510 Atherosclerotic heart disease of native coronary artery without angina pectoris: Secondary | ICD-10-CM | POA: Diagnosis not present

## 2019-10-14 DIAGNOSIS — I444 Left anterior fascicular block: Secondary | ICD-10-CM

## 2019-10-14 DIAGNOSIS — I480 Paroxysmal atrial fibrillation: Secondary | ICD-10-CM | POA: Diagnosis not present

## 2019-10-14 DIAGNOSIS — I10 Essential (primary) hypertension: Secondary | ICD-10-CM | POA: Diagnosis not present

## 2019-10-14 DIAGNOSIS — E785 Hyperlipidemia, unspecified: Secondary | ICD-10-CM | POA: Diagnosis not present

## 2019-10-14 DIAGNOSIS — R002 Palpitations: Secondary | ICD-10-CM

## 2019-10-14 DIAGNOSIS — R42 Dizziness and giddiness: Secondary | ICD-10-CM

## 2019-10-14 DIAGNOSIS — R9439 Abnormal result of other cardiovascular function study: Secondary | ICD-10-CM | POA: Diagnosis not present

## 2019-10-14 HISTORY — DX: Dizziness and giddiness: R42

## 2019-10-14 HISTORY — DX: Unspecified right bundle-branch block: I45.10

## 2019-10-14 HISTORY — DX: Left anterior fascicular block: I44.4

## 2019-10-14 HISTORY — DX: Palpitations: R00.2

## 2019-10-15 DIAGNOSIS — I4510 Unspecified right bundle-branch block: Secondary | ICD-10-CM | POA: Diagnosis not present

## 2019-10-15 DIAGNOSIS — I452 Bifascicular block: Secondary | ICD-10-CM | POA: Diagnosis not present

## 2019-10-15 DIAGNOSIS — I444 Left anterior fascicular block: Secondary | ICD-10-CM | POA: Diagnosis not present

## 2019-10-15 DIAGNOSIS — R001 Bradycardia, unspecified: Secondary | ICD-10-CM | POA: Diagnosis not present

## 2019-11-02 DIAGNOSIS — I454 Nonspecific intraventricular block: Secondary | ICD-10-CM | POA: Diagnosis not present

## 2019-11-02 DIAGNOSIS — I472 Ventricular tachycardia: Secondary | ICD-10-CM | POA: Diagnosis not present

## 2019-11-02 DIAGNOSIS — R002 Palpitations: Secondary | ICD-10-CM | POA: Diagnosis not present

## 2019-11-02 DIAGNOSIS — R42 Dizziness and giddiness: Secondary | ICD-10-CM | POA: Diagnosis not present

## 2019-11-02 DIAGNOSIS — I471 Supraventricular tachycardia: Secondary | ICD-10-CM | POA: Diagnosis not present

## 2019-11-02 DIAGNOSIS — I493 Ventricular premature depolarization: Secondary | ICD-10-CM | POA: Diagnosis not present

## 2019-11-02 DIAGNOSIS — I492 Junctional premature depolarization: Secondary | ICD-10-CM | POA: Diagnosis not present

## 2019-11-14 DIAGNOSIS — R002 Palpitations: Secondary | ICD-10-CM | POA: Diagnosis not present

## 2019-11-30 DIAGNOSIS — R9439 Abnormal result of other cardiovascular function study: Secondary | ICD-10-CM | POA: Diagnosis not present

## 2019-11-30 DIAGNOSIS — I10 Essential (primary) hypertension: Secondary | ICD-10-CM | POA: Diagnosis not present

## 2019-11-30 DIAGNOSIS — I444 Left anterior fascicular block: Secondary | ICD-10-CM | POA: Diagnosis not present

## 2019-11-30 DIAGNOSIS — I2510 Atherosclerotic heart disease of native coronary artery without angina pectoris: Secondary | ICD-10-CM | POA: Diagnosis not present

## 2019-11-30 DIAGNOSIS — I4510 Unspecified right bundle-branch block: Secondary | ICD-10-CM | POA: Diagnosis not present

## 2019-11-30 DIAGNOSIS — I480 Paroxysmal atrial fibrillation: Secondary | ICD-10-CM | POA: Diagnosis not present

## 2019-12-07 DIAGNOSIS — R5383 Other fatigue: Secondary | ICD-10-CM | POA: Diagnosis not present

## 2019-12-07 DIAGNOSIS — J301 Allergic rhinitis due to pollen: Secondary | ICD-10-CM | POA: Diagnosis not present

## 2019-12-07 DIAGNOSIS — J4520 Mild intermittent asthma, uncomplicated: Secondary | ICD-10-CM | POA: Diagnosis not present

## 2019-12-07 DIAGNOSIS — G4733 Obstructive sleep apnea (adult) (pediatric): Secondary | ICD-10-CM | POA: Diagnosis not present

## 2019-12-07 DIAGNOSIS — E662 Morbid (severe) obesity with alveolar hypoventilation: Secondary | ICD-10-CM | POA: Diagnosis not present

## 2019-12-17 DIAGNOSIS — G4733 Obstructive sleep apnea (adult) (pediatric): Secondary | ICD-10-CM | POA: Diagnosis not present

## 2019-12-21 DIAGNOSIS — J301 Allergic rhinitis due to pollen: Secondary | ICD-10-CM | POA: Diagnosis not present

## 2019-12-21 DIAGNOSIS — G4733 Obstructive sleep apnea (adult) (pediatric): Secondary | ICD-10-CM | POA: Diagnosis not present

## 2019-12-21 DIAGNOSIS — G4761 Periodic limb movement disorder: Secondary | ICD-10-CM | POA: Diagnosis not present

## 2019-12-21 DIAGNOSIS — J4520 Mild intermittent asthma, uncomplicated: Secondary | ICD-10-CM | POA: Diagnosis not present

## 2019-12-21 DIAGNOSIS — R5383 Other fatigue: Secondary | ICD-10-CM | POA: Diagnosis not present

## 2019-12-21 DIAGNOSIS — E662 Morbid (severe) obesity with alveolar hypoventilation: Secondary | ICD-10-CM | POA: Diagnosis not present

## 2020-01-02 DIAGNOSIS — I517 Cardiomegaly: Secondary | ICD-10-CM | POA: Diagnosis not present

## 2020-01-03 DIAGNOSIS — G4733 Obstructive sleep apnea (adult) (pediatric): Secondary | ICD-10-CM | POA: Diagnosis not present

## 2020-01-06 DIAGNOSIS — J4520 Mild intermittent asthma, uncomplicated: Secondary | ICD-10-CM | POA: Diagnosis not present

## 2020-01-06 DIAGNOSIS — G4761 Periodic limb movement disorder: Secondary | ICD-10-CM | POA: Diagnosis not present

## 2020-01-06 DIAGNOSIS — R5383 Other fatigue: Secondary | ICD-10-CM | POA: Diagnosis not present

## 2020-01-06 DIAGNOSIS — J301 Allergic rhinitis due to pollen: Secondary | ICD-10-CM | POA: Diagnosis not present

## 2020-01-06 DIAGNOSIS — G4733 Obstructive sleep apnea (adult) (pediatric): Secondary | ICD-10-CM | POA: Diagnosis not present

## 2020-01-06 DIAGNOSIS — E662 Morbid (severe) obesity with alveolar hypoventilation: Secondary | ICD-10-CM | POA: Diagnosis not present

## 2020-02-20 DIAGNOSIS — G4733 Obstructive sleep apnea (adult) (pediatric): Secondary | ICD-10-CM | POA: Diagnosis not present

## 2020-03-21 DIAGNOSIS — J4520 Mild intermittent asthma, uncomplicated: Secondary | ICD-10-CM | POA: Diagnosis not present

## 2020-03-21 DIAGNOSIS — G4733 Obstructive sleep apnea (adult) (pediatric): Secondary | ICD-10-CM | POA: Diagnosis not present

## 2020-03-21 DIAGNOSIS — E662 Morbid (severe) obesity with alveolar hypoventilation: Secondary | ICD-10-CM | POA: Diagnosis not present

## 2020-03-21 DIAGNOSIS — R5383 Other fatigue: Secondary | ICD-10-CM | POA: Diagnosis not present

## 2020-03-21 DIAGNOSIS — J301 Allergic rhinitis due to pollen: Secondary | ICD-10-CM | POA: Diagnosis not present

## 2020-03-21 DIAGNOSIS — G4761 Periodic limb movement disorder: Secondary | ICD-10-CM | POA: Diagnosis not present

## 2020-04-09 ENCOUNTER — Other Ambulatory Visit: Payer: Self-pay | Admitting: Podiatry

## 2020-04-09 ENCOUNTER — Other Ambulatory Visit: Payer: Self-pay

## 2020-04-09 ENCOUNTER — Ambulatory Visit: Payer: PPO | Admitting: Podiatry

## 2020-04-09 ENCOUNTER — Ambulatory Visit (INDEPENDENT_AMBULATORY_CARE_PROVIDER_SITE_OTHER): Payer: PPO

## 2020-04-09 DIAGNOSIS — M2041 Other hammer toe(s) (acquired), right foot: Secondary | ICD-10-CM

## 2020-04-09 DIAGNOSIS — M79675 Pain in left toe(s): Secondary | ICD-10-CM

## 2020-04-09 DIAGNOSIS — G8929 Other chronic pain: Secondary | ICD-10-CM | POA: Diagnosis not present

## 2020-04-09 DIAGNOSIS — M205X2 Other deformities of toe(s) (acquired), left foot: Secondary | ICD-10-CM

## 2020-04-09 DIAGNOSIS — M2042 Other hammer toe(s) (acquired), left foot: Secondary | ICD-10-CM

## 2020-04-09 DIAGNOSIS — I2510 Atherosclerotic heart disease of native coronary artery without angina pectoris: Secondary | ICD-10-CM

## 2020-04-09 DIAGNOSIS — I480 Paroxysmal atrial fibrillation: Secondary | ICD-10-CM

## 2020-04-09 DIAGNOSIS — Q6689 Other  specified congenital deformities of feet: Secondary | ICD-10-CM

## 2020-04-09 NOTE — Progress Notes (Signed)
  Subjective:  Patient ID: Lance Pham, male    DOB: January 26, 1951,  MRN: [id number]  Chief Complaint  Patient presents with  . Foot Pain    Lt 2nd-3rd toes abnormail curving with lesion at tip of toes x years -pain or soreness on the lesions -pt denies redness/swelling -w/ gait problem -wros with certain shoes Tx; trimming    69 y.o. male presents with the above complaint. History confirmed with patient.   Objective:  Physical Exam: warm, good capillary refill, no trophic changes or ulcerative lesions, normal DP and PT pulses and normal sensory exam. Left Foot: semi-rigid hammertoe/clawtoe deformities with hard corns 2nd/3rd toes. Prominent hallux extensor tendon  Right Foot: semi-rigid hammertoe deformities.   No images are attached to the encounter.  Radiographs: X-ray of the left foot: no fracture, dislocation, swelling or degenerative changes noted and digital contractures Assessment:   1. Hammer toe of left foot   2. Hallux extensus, acquired, left   3. Chronic pain of toe, left   4. Claw toe   5. CAD in native artery   6. Paroxysmal atrial fibrillation ([place])      Plan:  Patient was evaluated and treated and all questions answered.  Hammertoes, Hallux extensus  -XR reviewed with patient -Educated on etiology of deformity -Discussed padding and shoe gear changes -Patient has failed all conservative therapy and wishes to proceed with surgical intervention. All risks, benefits, and alternatives discussed with patient. No guarantees given. Consent reviewed and signed by patient. -Planned procedures: left 2nd/3rd hammertoe corrections with pin or screw fixation, possible hallux extensor tendon lengthening  -Risk factors: cardiac hx, needs cardiac clearance.  No follow-ups on file.

## 2020-04-09 NOTE — Patient Instructions (Signed)
Pre-Operative Instructions  Congratulations, you have decided to take an important step towards improving your quality of life.  You can be assured that the doctors and staff at [facility] will be with you every step of the way.  Here are some important things you should know:  1. Plan to be at the surgery center/hospital at least 1 (one) hour prior to your scheduled time, unless otherwise directed by the surgical center/hospital staff.  You must have a responsible adult accompany you, remain during the surgery and drive you home.  Make sure you have directions to the surgical center/hospital to ensure you arrive on time. 2. If you are having surgery at [facility] or [facility], you will need a copy of your medical history and physical form from your family physician within one month prior to the date of surgery. We will give you a form for your primary physician to complete.  3. We make every effort to accommodate the date you request for surgery.  However, there are times where surgery dates or times have to be moved.  We will contact you as soon as possible if a change in schedule is required.   4. No aspirin/ibuprofen for one week before surgery.  If you are on aspirin, any non-steroidal anti-inflammatory medications (Mobic, Aleve, Ibuprofen) should not be taken seven (7) days prior to your surgery.  You make take Tylenol for pain prior to surgery.  5. Medications - If you are taking daily heart and blood pressure medications, seizure, reflux, allergy, asthma, anxiety, pain or diabetes medications, make sure you notify the surgery center/hospital before the day of surgery so they can tell you which medications you should take or avoid the day of surgery. 6. No food or drink after midnight the night before surgery unless directed otherwise by surgical center/hospital staff. 7. No alcoholic beverages 24-hours prior to surgery.  No smoking 24-hours prior or 24-hours after  surgery. 8. Wear loose pants or shorts. They should be loose enough to fit over bandages, boots, and casts. 9. Don't wear slip-on shoes. Sneakers are preferred. 10. Bring your boot with you to the surgery center/hospital.  Also bring crutches or a walker if your physician has prescribed it for you.  If you do not have this equipment, it will be provided for you after surgery. 11. If you have not been contacted by the surgery center/hospital by the day before your surgery, call to confirm the date and time of your surgery. 12. Leave-time from work may vary depending on the type of surgery you have.  Appropriate arrangements should be made prior to surgery with your employer. 13. Prescriptions will be provided immediately following surgery by your doctor.  Fill these as soon as possible after surgery and take the medication as directed. Pain medications will not be refilled on weekends and must be approved by the doctor. 14. Remove nail polish on the operative foot and avoid getting pedicures prior to surgery. 15. Wash the night before surgery.  The night before surgery wash the foot and leg well with water and the antibacterial soap provided. Be sure to pay special attention to beneath the toenails and in between the toes.  Wash for at least three (3) minutes. Rinse thoroughly with water and dry well with a towel.  Perform this wash unless told not to do so by your physician.  Enclosed: 1 Ice pack (please put in freezer the night before surgery)   1 Hibiclens skin cleaner     Pre-op instructions  If you have any questions regarding the instructions, please do not hesitate to call our office.  [place]: [address], [place], [place] [address] -- [phone]  [place]: [address] [place], [place] [address] -- [phone]  [place]: [address], [place], [place] [address] -- [phone]   Website: [email or web address] 

## 2020-04-12 ENCOUNTER — Other Ambulatory Visit: Payer: Self-pay | Admitting: Podiatry

## 2020-04-12 DIAGNOSIS — M2042 Other hammer toe(s) (acquired), left foot: Secondary | ICD-10-CM

## 2020-04-12 DIAGNOSIS — M205X2 Other deformities of toe(s) (acquired), left foot: Secondary | ICD-10-CM

## 2020-05-31 ENCOUNTER — Telehealth: Payer: Self-pay

## 2020-05-31 DIAGNOSIS — G4733 Obstructive sleep apnea (adult) (pediatric): Secondary | ICD-10-CM | POA: Diagnosis not present

## 2020-05-31 NOTE — Telephone Encounter (Signed)
DOS 06/13/20  TENOTOMY 2,3 LT - 28010 HAMMERTOE REPAIR 2,3 LT - 12224 EXTENSER TENDON LENGTHENING LT - 28208  RECEIVED FAX FROM [facility] WITH AUTH FOR CPT 28010, 11464 & 28208.  AUTH # [id number] GOOD FROM 06/13/20 - 09/11/2020

## 2020-06-04 DIAGNOSIS — I480 Paroxysmal atrial fibrillation: Secondary | ICD-10-CM | POA: Diagnosis not present

## 2020-06-04 DIAGNOSIS — R9439 Abnormal result of other cardiovascular function study: Secondary | ICD-10-CM | POA: Diagnosis not present

## 2020-06-04 DIAGNOSIS — I2510 Atherosclerotic heart disease of native coronary artery without angina pectoris: Secondary | ICD-10-CM | POA: Diagnosis not present

## 2020-06-04 DIAGNOSIS — I4510 Unspecified right bundle-branch block: Secondary | ICD-10-CM | POA: Diagnosis not present

## 2020-06-04 DIAGNOSIS — E785 Hyperlipidemia, unspecified: Secondary | ICD-10-CM | POA: Diagnosis not present

## 2020-06-04 DIAGNOSIS — I444 Left anterior fascicular block: Secondary | ICD-10-CM | POA: Diagnosis not present

## 2020-06-04 DIAGNOSIS — I10 Essential (primary) hypertension: Secondary | ICD-10-CM | POA: Diagnosis not present

## 2020-06-13 ENCOUNTER — Encounter: Payer: Self-pay | Admitting: Podiatry

## 2020-06-13 ENCOUNTER — Other Ambulatory Visit: Payer: Self-pay | Admitting: Podiatry

## 2020-06-13 DIAGNOSIS — I10 Essential (primary) hypertension: Secondary | ICD-10-CM | POA: Diagnosis not present

## 2020-06-13 DIAGNOSIS — M6702 Short Achilles tendon (acquired), left ankle: Secondary | ICD-10-CM | POA: Diagnosis not present

## 2020-06-13 DIAGNOSIS — M67874 Other specified disorders of tendon, left ankle and foot: Secondary | ICD-10-CM | POA: Diagnosis not present

## 2020-06-13 DIAGNOSIS — M2042 Other hammer toe(s) (acquired), left foot: Secondary | ICD-10-CM | POA: Diagnosis not present

## 2020-06-13 DIAGNOSIS — M7752 Other enthesopathy of left foot: Secondary | ICD-10-CM | POA: Diagnosis not present

## 2020-06-13 DIAGNOSIS — M2012 Hallux valgus (acquired), left foot: Secondary | ICD-10-CM | POA: Diagnosis not present

## 2020-06-13 MED ORDER — OXYCODONE-ACETAMINOPHEN 10-325 MG PO TABS: 1.0000 | ORAL_TABLET | ORAL | 0 refills | Status: DC | PRN

## 2020-06-13 MED ORDER — CEPHALEXIN 500 MG PO CAPS: 500.0000 mg | ORAL_CAPSULE | Freq: Two times a day (BID) | ORAL | 0 refills | Status: DC

## 2020-06-14 ENCOUNTER — Telehealth: Payer: Self-pay | Admitting: Podiatry

## 2020-06-14 MED ORDER — HYDROMORPHONE HCL 2 MG PO TABS: 2.0000 mg | ORAL_TABLET | ORAL | 0 refills | Status: DC | PRN

## 2020-06-14 NOTE — Addendum Note (Signed)
Addended by: Hardie Pulley on: 06/14/2020 02:35 PM   Modules accepted: Orders

## 2020-06-14 NOTE — Telephone Encounter (Signed)
Increased pain medication to dilaudid

## 2020-06-14 NOTE — Telephone Encounter (Signed)
Pt had surgery yesterday & was given oxycodone 10/325.  Pt is requesting stronger medication. States is helping only a little. [facility] [address] [place] Alert

## 2020-06-18 ENCOUNTER — Ambulatory Visit (INDEPENDENT_AMBULATORY_CARE_PROVIDER_SITE_OTHER): Payer: PPO

## 2020-06-18 ENCOUNTER — Ambulatory Visit (INDEPENDENT_AMBULATORY_CARE_PROVIDER_SITE_OTHER): Payer: PPO | Admitting: Podiatry

## 2020-06-18 ENCOUNTER — Other Ambulatory Visit: Payer: Self-pay | Admitting: Podiatry

## 2020-06-18 ENCOUNTER — Other Ambulatory Visit: Payer: Self-pay

## 2020-06-18 DIAGNOSIS — Z9889 Other specified postprocedural states: Secondary | ICD-10-CM

## 2020-06-18 DIAGNOSIS — G8929 Other chronic pain: Secondary | ICD-10-CM

## 2020-06-18 DIAGNOSIS — M2042 Other hammer toe(s) (acquired), left foot: Secondary | ICD-10-CM

## 2020-06-18 DIAGNOSIS — M205X2 Other deformities of toe(s) (acquired), left foot: Secondary | ICD-10-CM

## 2020-06-18 DIAGNOSIS — M79675 Pain in left toe(s): Secondary | ICD-10-CM

## 2020-06-18 NOTE — Progress Notes (Signed)
  Subjective:  Patient ID: Lance Pham, male    DOB: 01/11/1951,  MRN: [id number]  Chief Complaint  Patient presents with  . Routine Post Op    POV #1 Pt. states,' it feels alright, wit pain at different times hwen I stand on it; 3/10." -pt denies N/V/F/Ch - pt had to re-dressed bandage Sat due to being very loose Tx: boot, elevation and PRN meds    DOS: 06/13/20 Procedure: Left great toe extensor lengthening, 2nd/3rd toe hammertoe correction with screw, flexor tenotomy 2nd/3rd   69 y.o. male presents with the above complaint. History confirmed with patient. Pain improving.  Objective:  Physical Exam: tenderness at the surgical site, local edema noted, calf supple, nontender and toes rectus. Incision: healing well, no significant drainage, no dehiscence, no significant erythema  No images are attached to the encounter.  Radiographs: X-ray of the left foot: consistent with post-op state, screws intact. Good positioning.   Assessment:   1. Hammer toe of left foot   2. Hallux extensus, acquired, left   3. Chronic pain of toe, left   4. Post-operative state     Plan:  Patient was evaluated and treated and all questions answered.  Post-operative State -XR reviewed with patient -Dressing applied consisting of sterile gauze, kerlix and ACE bandage -WBAT in CAM boot  -Declines refill of pain rx.  No follow-ups on file.

## 2020-06-19 ENCOUNTER — Encounter: Payer: PPO | Admitting: Podiatry

## 2020-06-25 ENCOUNTER — Other Ambulatory Visit: Payer: Self-pay

## 2020-06-25 ENCOUNTER — Ambulatory Visit (INDEPENDENT_AMBULATORY_CARE_PROVIDER_SITE_OTHER): Payer: PPO | Admitting: Podiatry

## 2020-06-25 DIAGNOSIS — D518 Other vitamin B12 deficiency anemias: Secondary | ICD-10-CM | POA: Diagnosis not present

## 2020-06-25 DIAGNOSIS — N3281 Overactive bladder: Secondary | ICD-10-CM | POA: Diagnosis not present

## 2020-06-25 DIAGNOSIS — E782 Mixed hyperlipidemia: Secondary | ICD-10-CM | POA: Diagnosis not present

## 2020-06-25 DIAGNOSIS — G729 Myopathy, unspecified: Secondary | ICD-10-CM | POA: Diagnosis not present

## 2020-06-25 DIAGNOSIS — E559 Vitamin D deficiency, unspecified: Secondary | ICD-10-CM | POA: Diagnosis not present

## 2020-06-25 DIAGNOSIS — Z9889 Other specified postprocedural states: Secondary | ICD-10-CM

## 2020-06-25 DIAGNOSIS — Z79899 Other long term (current) drug therapy: Secondary | ICD-10-CM | POA: Diagnosis not present

## 2020-06-25 DIAGNOSIS — I739 Peripheral vascular disease, unspecified: Secondary | ICD-10-CM | POA: Diagnosis not present

## 2020-06-25 DIAGNOSIS — E291 Testicular hypofunction: Secondary | ICD-10-CM | POA: Diagnosis not present

## 2020-06-25 DIAGNOSIS — M2042 Other hammer toe(s) (acquired), left foot: Secondary | ICD-10-CM

## 2020-06-25 DIAGNOSIS — Z1211 Encounter for screening for malignant neoplasm of colon: Secondary | ICD-10-CM | POA: Diagnosis not present

## 2020-06-25 DIAGNOSIS — I25118 Atherosclerotic heart disease of native coronary artery with other forms of angina pectoris: Secondary | ICD-10-CM | POA: Diagnosis not present

## 2020-06-25 DIAGNOSIS — E2740 Unspecified adrenocortical insufficiency: Secondary | ICD-10-CM | POA: Diagnosis not present

## 2020-06-25 DIAGNOSIS — E038 Other specified hypothyroidism: Secondary | ICD-10-CM | POA: Diagnosis not present

## 2020-06-25 DIAGNOSIS — N401 Enlarged prostate with lower urinary tract symptoms: Secondary | ICD-10-CM | POA: Diagnosis not present

## 2020-06-25 DIAGNOSIS — I10 Essential (primary) hypertension: Secondary | ICD-10-CM | POA: Diagnosis not present

## 2020-06-25 DIAGNOSIS — E1165 Type 2 diabetes mellitus with hyperglycemia: Secondary | ICD-10-CM | POA: Diagnosis not present

## 2020-06-25 DIAGNOSIS — F3342 Major depressive disorder, recurrent, in full remission: Secondary | ICD-10-CM | POA: Diagnosis not present

## 2020-06-26 ENCOUNTER — Encounter: Payer: PPO | Admitting: Podiatry

## 2020-06-26 DIAGNOSIS — G4733 Obstructive sleep apnea (adult) (pediatric): Secondary | ICD-10-CM | POA: Diagnosis not present

## 2020-06-26 DIAGNOSIS — J4520 Mild intermittent asthma, uncomplicated: Secondary | ICD-10-CM | POA: Diagnosis not present

## 2020-06-26 DIAGNOSIS — E662 Morbid (severe) obesity with alveolar hypoventilation: Secondary | ICD-10-CM | POA: Diagnosis not present

## 2020-06-26 DIAGNOSIS — G4761 Periodic limb movement disorder: Secondary | ICD-10-CM | POA: Diagnosis not present

## 2020-06-26 DIAGNOSIS — J301 Allergic rhinitis due to pollen: Secondary | ICD-10-CM | POA: Diagnosis not present

## 2020-07-02 DIAGNOSIS — E782 Mixed hyperlipidemia: Secondary | ICD-10-CM | POA: Diagnosis not present

## 2020-07-02 DIAGNOSIS — N401 Enlarged prostate with lower urinary tract symptoms: Secondary | ICD-10-CM | POA: Diagnosis not present

## 2020-07-02 DIAGNOSIS — D518 Other vitamin B12 deficiency anemias: Secondary | ICD-10-CM | POA: Diagnosis not present

## 2020-07-02 DIAGNOSIS — I10 Essential (primary) hypertension: Secondary | ICD-10-CM | POA: Diagnosis not present

## 2020-07-02 DIAGNOSIS — I25118 Atherosclerotic heart disease of native coronary artery with other forms of angina pectoris: Secondary | ICD-10-CM | POA: Diagnosis not present

## 2020-07-02 DIAGNOSIS — E038 Other specified hypothyroidism: Secondary | ICD-10-CM | POA: Diagnosis not present

## 2020-07-02 DIAGNOSIS — F3342 Major depressive disorder, recurrent, in full remission: Secondary | ICD-10-CM | POA: Diagnosis not present

## 2020-07-02 DIAGNOSIS — E1165 Type 2 diabetes mellitus with hyperglycemia: Secondary | ICD-10-CM | POA: Diagnosis not present

## 2020-07-02 DIAGNOSIS — E559 Vitamin D deficiency, unspecified: Secondary | ICD-10-CM | POA: Diagnosis not present

## 2020-07-02 DIAGNOSIS — F331 Major depressive disorder, recurrent, moderate: Secondary | ICD-10-CM | POA: Diagnosis not present

## 2020-07-05 NOTE — Progress Notes (Signed)
  Subjective:  Patient ID: Lance Pham, male    DOB: [birth year],  MRN: [id number]  Chief Complaint  Patient presents with  . Routine Post Op    PO V#2 -PT denies N/V/F/Ch/complaint/pain - pt states," doing fine."    DOS: 06/13/20 Procedure: Left great toe extensor lengthening, 2nd/3rd toe hammertoe correction with screw, flexor tenotomy 2nd/3rd   69 y.o. male presents with the above complaint. History confirmed with patient. Pain improving.  Objective:  Physical Exam: tenderness at the surgical site, local edema noted, calf supple, nontender and toes rectus. Incision: healing well, no significant drainage, no dehiscence, no significant erythema  Assessment:   1. Hammer toe of left foot   2. Post-operative state     Plan:  Patient was evaluated and treated and all questions answered.  Post-operative State -Sutures removed -Ok to start showering at this time. Advised they cannot soak. -WBAT in CAM boot   No follow-ups on file.

## 2020-07-10 ENCOUNTER — Encounter: Payer: PPO | Admitting: Podiatry

## 2020-07-12 ENCOUNTER — Encounter: Payer: PPO | Admitting: Podiatry

## 2020-07-19 ENCOUNTER — Other Ambulatory Visit: Payer: Self-pay

## 2020-07-19 ENCOUNTER — Ambulatory Visit (INDEPENDENT_AMBULATORY_CARE_PROVIDER_SITE_OTHER): Payer: PPO | Admitting: Podiatry

## 2020-07-19 ENCOUNTER — Encounter: Payer: Self-pay | Admitting: Podiatry

## 2020-07-19 DIAGNOSIS — Z9889 Other specified postprocedural states: Secondary | ICD-10-CM

## 2020-07-19 DIAGNOSIS — M2042 Other hammer toe(s) (acquired), left foot: Secondary | ICD-10-CM

## 2020-08-01 ENCOUNTER — Encounter: Payer: Self-pay | Admitting: Gastroenterology

## 2020-08-02 ENCOUNTER — Ambulatory Visit (INDEPENDENT_AMBULATORY_CARE_PROVIDER_SITE_OTHER): Payer: PPO | Admitting: Podiatry

## 2020-08-02 ENCOUNTER — Other Ambulatory Visit: Payer: Self-pay

## 2020-08-02 ENCOUNTER — Ambulatory Visit (INDEPENDENT_AMBULATORY_CARE_PROVIDER_SITE_OTHER): Payer: PPO

## 2020-08-02 DIAGNOSIS — D518 Other vitamin B12 deficiency anemias: Secondary | ICD-10-CM | POA: Diagnosis not present

## 2020-08-02 DIAGNOSIS — F331 Major depressive disorder, recurrent, moderate: Secondary | ICD-10-CM | POA: Diagnosis not present

## 2020-08-02 DIAGNOSIS — I10 Essential (primary) hypertension: Secondary | ICD-10-CM | POA: Diagnosis not present

## 2020-08-02 DIAGNOSIS — E559 Vitamin D deficiency, unspecified: Secondary | ICD-10-CM | POA: Diagnosis not present

## 2020-08-02 DIAGNOSIS — Z5329 Procedure and treatment not carried out because of patient's decision for other reasons: Secondary | ICD-10-CM

## 2020-08-02 DIAGNOSIS — M2042 Other hammer toe(s) (acquired), left foot: Secondary | ICD-10-CM

## 2020-08-02 DIAGNOSIS — N401 Enlarged prostate with lower urinary tract symptoms: Secondary | ICD-10-CM | POA: Diagnosis not present

## 2020-08-02 DIAGNOSIS — E1165 Type 2 diabetes mellitus with hyperglycemia: Secondary | ICD-10-CM | POA: Diagnosis not present

## 2020-08-02 DIAGNOSIS — E038 Other specified hypothyroidism: Secondary | ICD-10-CM | POA: Diagnosis not present

## 2020-08-02 DIAGNOSIS — I25118 Atherosclerotic heart disease of native coronary artery with other forms of angina pectoris: Secondary | ICD-10-CM | POA: Diagnosis not present

## 2020-08-02 DIAGNOSIS — F3342 Major depressive disorder, recurrent, in full remission: Secondary | ICD-10-CM | POA: Diagnosis not present

## 2020-08-02 DIAGNOSIS — E782 Mixed hyperlipidemia: Secondary | ICD-10-CM | POA: Diagnosis not present

## 2020-08-02 NOTE — Progress Notes (Signed)
  Subjective:  Patient ID: Lance Pham, male    DOB: 02-27-1951,  MRN: [id number]  Chief Complaint  Patient presents with  . Routine Post Op    i am doing good on the left foot     DOS: 06/13/20 Procedure: Left great toe extensor lengthening, 2nd/3rd toe hammertoe correction with screw, flexor tenotomy 2nd/3rd   69 y.o. male presents with the above complaint. History confirmed with patient. Pain improving.  Objective:  Physical Exam: tenderness at the surgical site, local edema noted, calf supple, nontender and toes rectus. Incision: healing well, no significant drainage, no dehiscence, no significant erythema  Assessment:   1. Hammer toe of left foot   2. Post-operative state     Plan:  Patient was evaluated and treated and all questions answered.  Post-operative State -Sutures removed 2nd/3rd toes. -Continue WBAT in surgical shoes. -F/u in 2 weeks for recheck with XRs.  No follow-ups on file.

## 2020-08-03 NOTE — Progress Notes (Signed)
Appt was rescheduled.

## 2020-08-06 ENCOUNTER — Other Ambulatory Visit: Payer: Self-pay

## 2020-08-06 ENCOUNTER — Ambulatory Visit (INDEPENDENT_AMBULATORY_CARE_PROVIDER_SITE_OTHER): Payer: PPO | Admitting: Podiatry

## 2020-08-06 ENCOUNTER — Encounter: Payer: Self-pay | Admitting: Podiatry

## 2020-08-06 DIAGNOSIS — Z9889 Other specified postprocedural states: Secondary | ICD-10-CM

## 2020-08-06 DIAGNOSIS — M205X2 Other deformities of toe(s) (acquired), left foot: Secondary | ICD-10-CM

## 2020-08-06 DIAGNOSIS — M2042 Other hammer toe(s) (acquired), left foot: Secondary | ICD-10-CM

## 2020-08-06 DIAGNOSIS — Q6689 Other  specified congenital deformities of feet: Secondary | ICD-10-CM

## 2020-08-06 NOTE — Progress Notes (Signed)
  Subjective:  Patient ID: Lance Pham, male    DOB: 04-07-1951,  MRN: [id number]  Chief Complaint  Patient presents with  . Routine Post Op    i am doing good and there is not any swelling and had x-rays done last visit   DOS: 06/13/20 Procedure: Left great toe extensor lengthening, 2nd/3rd toe hammertoe correction with screw, flexor tenotomy 2nd/3rd   69 y.o. male presents with the above complaint. History confirmed with patient. In normal shoegear without pain, pleased with surgery outcome.  Objective:  Physical Exam: no tenderness, 2nd/3rd toes rectus without POP. Hallux without extensus contracture but with slight malleus. Incision: well healed  Assessment:   1. Hammer toe of left foot   2. Post-operative state   3. Hallux extensus, acquired, left   4. Claw toe     Plan:  Patient was evaluated and treated and all questions answered.  Post-operative State -XR from 10/1 reviewed. 2nd healing well, 3rd without osseous bridging. Discussed possible need for screw removal if the screws back out.  -Continue WBAT in normal shoegear -F/u in 2 months for repeat XRs  Return in about 2 months (around 10/06/2020) for Hammertoe f/u with XRs.

## 2020-08-13 DIAGNOSIS — E559 Vitamin D deficiency, unspecified: Secondary | ICD-10-CM | POA: Diagnosis not present

## 2020-08-13 DIAGNOSIS — F331 Major depressive disorder, recurrent, moderate: Secondary | ICD-10-CM | POA: Diagnosis not present

## 2020-08-13 DIAGNOSIS — E1165 Type 2 diabetes mellitus with hyperglycemia: Secondary | ICD-10-CM | POA: Diagnosis not present

## 2020-08-13 DIAGNOSIS — F3342 Major depressive disorder, recurrent, in full remission: Secondary | ICD-10-CM | POA: Diagnosis not present

## 2020-08-13 DIAGNOSIS — D518 Other vitamin B12 deficiency anemias: Secondary | ICD-10-CM | POA: Diagnosis not present

## 2020-08-13 DIAGNOSIS — E782 Mixed hyperlipidemia: Secondary | ICD-10-CM | POA: Diagnosis not present

## 2020-08-13 DIAGNOSIS — N401 Enlarged prostate with lower urinary tract symptoms: Secondary | ICD-10-CM | POA: Diagnosis not present

## 2020-08-13 DIAGNOSIS — I10 Essential (primary) hypertension: Secondary | ICD-10-CM | POA: Diagnosis not present

## 2020-08-13 DIAGNOSIS — E038 Other specified hypothyroidism: Secondary | ICD-10-CM | POA: Diagnosis not present

## 2020-08-13 DIAGNOSIS — I25118 Atherosclerotic heart disease of native coronary artery with other forms of angina pectoris: Secondary | ICD-10-CM | POA: Diagnosis not present

## 2020-08-14 DIAGNOSIS — I480 Paroxysmal atrial fibrillation: Secondary | ICD-10-CM | POA: Diagnosis not present

## 2020-08-23 DIAGNOSIS — I480 Paroxysmal atrial fibrillation: Secondary | ICD-10-CM | POA: Diagnosis not present

## 2020-08-30 DIAGNOSIS — G4733 Obstructive sleep apnea (adult) (pediatric): Secondary | ICD-10-CM | POA: Diagnosis not present

## 2020-09-03 DIAGNOSIS — I480 Paroxysmal atrial fibrillation: Secondary | ICD-10-CM | POA: Diagnosis not present

## 2020-09-10 DIAGNOSIS — R972 Elevated prostate specific antigen [PSA]: Secondary | ICD-10-CM | POA: Diagnosis not present

## 2020-09-10 DIAGNOSIS — N401 Enlarged prostate with lower urinary tract symptoms: Secondary | ICD-10-CM | POA: Diagnosis not present

## 2020-09-10 DIAGNOSIS — Z8042 Family history of malignant neoplasm of prostate: Secondary | ICD-10-CM | POA: Diagnosis not present

## 2020-09-13 ENCOUNTER — Ambulatory Visit (AMBULATORY_SURGERY_CENTER): Payer: Self-pay

## 2020-09-13 ENCOUNTER — Encounter: Payer: Self-pay | Admitting: Gastroenterology

## 2020-09-13 ENCOUNTER — Other Ambulatory Visit: Payer: Self-pay

## 2020-09-13 VITALS — Ht 74.0 in | Wt 314.0 lb

## 2020-09-13 DIAGNOSIS — Z800 Family history of malignant neoplasm of digestive organs: Secondary | ICD-10-CM

## 2020-09-13 DIAGNOSIS — Z86010 Personal history of colonic polyps: Secondary | ICD-10-CM

## 2020-09-13 NOTE — Progress Notes (Signed)
No egg or soy allergy known to patient  No issues with past sedation with any surgeries or procedures No intubation problems in the past  No FH of Malignant Hyperthermia No diet pills per patient No home 02 use per patient  No blood thinners per patient  Pt denies issues with constipation  HX OF AFIB- no meds at this time;   COVID 19 guidelines implemented in [place] today with Pt and RN  Coupon given to pt in [place] today , Code to Pharmacy  COVID vaccines completed on 08/2020 per pt;  Patient to stop taking IRON on 09/20/2020; Due to the COVID-19 pandemic we are asking patients to follow these guidelines. Please only bring one care partner. Please be aware that your care partner may wait in the car in the parking lot or if they feel like they will be too hot to wait in the car, they may wait in the lobby on the 4th floor. All care partners are required to wear a mask the entire time (we do not have any that we can provide them), they need to practice social distancing, and we will do a Covid check for all patient's and care partners when you arrive. Also we will check their temperature and your temperature. If the care partner waits in their car they need to stay in the parking lot the entire time and we will call them on their cell phone when the patient is ready for discharge so they can bring the car to the front of the building. Also all patient's will need to wear a mask into building.

## 2020-09-25 ENCOUNTER — Encounter: Payer: Self-pay | Admitting: Gastroenterology

## 2020-09-25 ENCOUNTER — Ambulatory Visit (AMBULATORY_SURGERY_CENTER): Payer: PPO | Admitting: Gastroenterology

## 2020-09-25 ENCOUNTER — Other Ambulatory Visit: Payer: Self-pay

## 2020-09-25 ENCOUNTER — Other Ambulatory Visit: Payer: Self-pay | Admitting: Gastroenterology

## 2020-09-25 VITALS — BP 105/51 | HR 43 | Temp 97.5°F | Resp 14 | Ht 74.0 in | Wt 314.0 lb

## 2020-09-25 DIAGNOSIS — D125 Benign neoplasm of sigmoid colon: Secondary | ICD-10-CM

## 2020-09-25 DIAGNOSIS — Z86010 Personal history of colonic polyps: Secondary | ICD-10-CM | POA: Diagnosis not present

## 2020-09-25 DIAGNOSIS — Z1211 Encounter for screening for malignant neoplasm of colon: Secondary | ICD-10-CM | POA: Diagnosis not present

## 2020-09-25 DIAGNOSIS — K635 Polyp of colon: Secondary | ICD-10-CM | POA: Diagnosis not present

## 2020-09-25 DIAGNOSIS — D120 Benign neoplasm of cecum: Secondary | ICD-10-CM | POA: Diagnosis not present

## 2020-09-25 DIAGNOSIS — Z800 Family history of malignant neoplasm of digestive organs: Secondary | ICD-10-CM

## 2020-09-25 MED ORDER — SODIUM CHLORIDE 0.9 % IV SOLN: 500.0000 mL | Freq: Once | INTRAVENOUS | Status: DC

## 2020-09-25 NOTE — Progress Notes (Signed)
Pt Drowsy. VSS. To PACU, report to RN. No anesthetic complications noted.  

## 2020-09-25 NOTE — Patient Instructions (Signed)
.  Handout on polyps, diverticulosis given.    YOU HAD AN ENDOSCOPIC PROCEDURE TODAY AT THE [facility]:   Refer to the procedure report that was given to you for any specific questions about what was found during the examination.  If the procedure report does not answer your questions, please call your gastroenterologist to clarify.  If you requested that your care partner not be given the details of your procedure findings, then the procedure report has been included in a sealed envelope for you to review at your convenience later.  YOU SHOULD EXPECT: Some feelings of bloating in the abdomen. Passage of more gas than usual.  Walking can help get rid of the air that was put into your GI tract during the procedure and reduce the bloating. If you had a lower endoscopy (such as a colonoscopy or flexible sigmoidoscopy) you may notice spotting of blood in your stool or on the toilet paper. If you underwent a bowel prep for your procedure, you may not have a normal bowel movement for a few days.  Please Note:  You might notice some irritation and congestion in your nose or some drainage.  This is from the oxygen used during your procedure.  There is no need for concern and it should clear up in a day or so.  SYMPTOMS TO REPORT IMMEDIATELY:   Following lower endoscopy (colonoscopy or flexible sigmoidoscopy):  Excessive amounts of blood in the stool  Significant tenderness or worsening of abdominal pains  Swelling of the abdomen that is new, acute  Fever of 100F or higher   For urgent or emergent issues, a gastroenterologist can be reached at any hour by calling [phone]. Do not use MyChart messaging for urgent concerns.    DIET:  We do recommend a small meal at first, but then you may proceed to your regular diet.  Drink plenty of fluids but you should avoid alcoholic beverages for 24 hours.  ACTIVITY:  You should plan to take it easy for the rest of today and you should NOT  DRIVE or use heavy machinery until tomorrow (because of the sedation medicines used during the test).    FOLLOW UP: Our staff will call the number listed on your records 48-72 hours following your procedure to check on you and address any questions or concerns that you may have regarding the information given to you following your procedure. If we do not reach you, we will leave a message.  We will attempt to reach you two times.  During this call, we will ask if you have developed any symptoms of COVID 19. If you develop any symptoms (ie: fever, flu-like symptoms, shortness of breath, cough etc.) before then, please call [phone].  If you test positive for Covid 19 in the 2 weeks post procedure, please call and report this information to us.    If any biopsies were taken you will be contacted by phone or by letter within the next 1-3 weeks.  Please call us at [phone] if you have not heard about the biopsies in 3 weeks.    SIGNATURES/CONFIDENTIALITY: You and/or your care partner have signed paperwork which will be entered into your electronic medical record.  These signatures attest to the fact that that the information above on your After Visit Summary has been reviewed and is understood.  Full responsibility of the confidentiality of this discharge information lies with you and/or your care-partner. 

## 2020-09-25 NOTE — Op Note (Addendum)
[place] Patient Name: Lance Pham Procedure Date: 09/25/2020 1:42 PM MRN: [id number] Endoscopist: Jackquline Denmark , MD Age: 69 Referring MD:  Date of Birth: Apr 24, 1951 Gender: Male Account #: [id number] Procedure:                Colonoscopy Indications:              High risk colon cancer surveillance: Personal                            history of colonic polyps. FH colon cancer (mom in                            mid-50s) Medicines:                Monitored Anesthesia Care Procedure:                Pre-Anesthesia Assessment:                           - Prior to the procedure, a History and Physical                            was performed, and patient medications and                            allergies were reviewed. The patient's tolerance of                            previous anesthesia was also reviewed. The risks                            and benefits of the procedure and the sedation                            options and risks were discussed with the patient.                            All questions were answered, and informed consent                            was obtained. Prior Anticoagulants: The patient has                            taken no previous anticoagulant or antiplatelet                            agents. ASA Grade Assessment: II - A patient with                            mild systemic disease. After reviewing the risks                            and benefits, the patient was deemed in  satisfactory condition to undergo the procedure.                           After obtaining informed consent, the colonoscope                            was passed under direct vision. Throughout the                            procedure, the patient's blood pressure, pulse, and                            oxygen saturations were monitored continuously. The                            Colonoscope was introduced through the anus and                             advanced to the the cecum, identified by                            appendiceal orifice and ileocecal valve. The                            Colonoscope was introduced through the anus and                            advanced to the. The colonoscopy was performed                            without difficulty. The patient tolerated the                            procedure well. The quality of the bowel                            preparation was good. The ileocecal valve,                            appendiceal orifice, and rectum were photographed. Scope In: 2:09:23 PM Scope Out: 2:36:32 PM Scope Withdrawal Time: 0 hours 22 minutes 46 seconds  Total Procedure Duration: 0 hours 27 minutes 9 seconds  Findings:                 Three sessile polyps were found in the sigmoid                            colon and cecum. The polyps were 4 to 6 mm in size.                            These polyps were removed with a cold snare.                            Resection and retrieval were  complete.                           Multiple medium-mouthed diverticula were found in                            the sigmoid colon and few in ascending colon.                           Non-bleeding internal hemorrhoids were found during                            retroflexion. The hemorrhoids were small.                           The exam was otherwise without abnormality on                            direct and retroflexion views. Complications:            No immediate complications. Estimated Blood Loss:     Estimated blood loss: none. Impression:               - Three 4 to 6 mm polyps in the sigmoid colon and                            in the cecum, removed with a cold snare. Resected                            and retrieved.                           - Moderate predominantly sigmoid diverticulosis.                           - Non-bleeding internal hemorrhoids.                           - The  examination was otherwise normal on direct                            and retroflexion views. Recommendation:           - Patient has a contact number available for                            emergencies. The signs and symptoms of potential                            delayed complications were discussed with the                            patient. Return to normal activities tomorrow.                            Written discharge instructions were provided to the  patient.                           - Resume previous diet.                           - Continue present medications.                           - Await pathology results.                           - Repeat colonoscopy for surveillance based on                            pathology results.                           - Return to GI clinic PRN.                           - The findings and recommendations were discussed                            with Barbaraann Boys, MD 09/25/2020 2:46:43 PM This report has been signed electronically.

## 2020-09-25 NOTE — Progress Notes (Signed)
Pt's states no medical or surgical changes since previsit or office visit.  MO-vitals

## 2020-09-25 NOTE — Progress Notes (Signed)
Called to room to assist during endoscopic procedure.  Patient ID and intended procedure confirmed with present staff. Received instructions for my participation in the procedure from the performing physician.  

## 2020-09-26 ENCOUNTER — Telehealth: Payer: Self-pay | Admitting: *Deleted

## 2020-09-26 NOTE — Telephone Encounter (Signed)
Follow up call made. Left message

## 2020-10-05 ENCOUNTER — Encounter: Payer: Self-pay | Admitting: Gastroenterology

## 2020-10-08 ENCOUNTER — Ambulatory Visit (INDEPENDENT_AMBULATORY_CARE_PROVIDER_SITE_OTHER): Payer: PPO | Admitting: Podiatry

## 2020-10-08 DIAGNOSIS — Z5329 Procedure and treatment not carried out because of patient's decision for other reasons: Secondary | ICD-10-CM

## 2020-10-08 NOTE — Progress Notes (Signed)
   Complete physical exam  Patient: Lance Pham   DOB: 08/23/1999   69 y.o. Male  MRN: [id number]  Subjective:    No chief complaint on file.   Lance Pham is a 69 y.o. male who presents today for a complete physical exam. She reports consuming a {diet types:17450} diet. {types:19826} She generally feels {DESC; WELL/FAIRLY WELL/POORLY:18703}. She reports sleeping {DESC; WELL/FAIRLY WELL/POORLY:18703}. She {does/does not:200015} have additional problems to discuss today.    Most recent fall risk assessment:    04/30/2022   10:42 AM  Fall Risk   Falls in the past year? 0  Number falls in past yr: 0  Injury with Fall? 0  Risk for fall due to : No Fall Risks  Follow up Falls evaluation completed     Most recent depression screenings:    04/30/2022   10:42 AM 03/21/2021   10:46 AM  PHQ 2/9 Scores  PHQ - 2 Score 0 0  PHQ- 9 Score 5     {VISON DENTAL STD PSA (Optional):27386}  {History (Optional):23778}  Patient Care Team: Jessup, Joy, NP as PCP - General (Nurse Practitioner)   Outpatient Medications Prior to Visit  Medication Sig   fluticasone (FLONASE) 50 MCG/ACT nasal spray Place 2 sprays into both nostrils in the morning and at bedtime. After 7 days, reduce to once daily.   norgestimate-ethinyl estradiol (SPRINTEC 28) 0.25-35 MG-MCG tablet Take 1 tablet by mouth daily.   Nystatin POWD Apply liberally to affected area 2 times per day   spironolactone (ALDACTONE) 100 MG tablet Take 1 tablet (100 mg total) by mouth daily.   No facility-administered medications prior to visit.    ROS        Objective:     There were no vitals taken for this visit. {Vitals History (Optional):23777}  Physical Exam   No results found for any visits on 06/05/22. {Show previous labs (optional):23779}    Assessment & Plan:    Routine Health Maintenance and Physical Exam  Immunization History  Administered Date(s) Administered   DTaP 11/06/1999, 01/02/2000,  03/12/2000, 11/26/2000, 06/11/2004   Hepatitis A 04/07/2008, 04/13/2009   Hepatitis B 08/24/1999, 10/01/1999, 03/12/2000   HiB (PRP-OMP) 11/06/1999, 01/02/2000, 03/12/2000, 11/26/2000   IPV 11/06/1999, 01/02/2000, 08/31/2000, 06/11/2004   Influenza,inj,Quad PF,6+ Mos 07/14/2014   Influenza-Unspecified 10/13/2012   MMR 08/31/2001, 06/11/2004   Meningococcal Polysaccharide 04/12/2012   Pneumococcal Conjugate-13 11/26/2000   Pneumococcal-Unspecified 03/12/2000, 05/26/2000   Tdap 04/12/2012   Varicella 08/31/2000, 04/07/2008    Health Maintenance  Topic Date Due   HIV Screening  Never done   Hepatitis C Screening  Never done   INFLUENZA VACCINE  06/03/2022   PAP-Cervical Cytology Screening  06/05/2022 (Originally 08/22/2020)   PAP SMEAR-Modifier  06/05/2022 (Originally 08/22/2020)   TETANUS/TDAP  06/05/2022 (Originally 04/12/2022)   HPV VACCINES  Discontinued   COVID-19 Vaccine  Discontinued    Discussed health benefits of physical activity, and encouraged her to engage in regular exercise appropriate for her age and condition.  Problem List Items Addressed This Visit   None Visit Diagnoses     Annual physical exam    -  Primary   Cervical cancer screening       Need for Tdap vaccination          No follow-ups on file.     Joy Jessup, NP   

## 2020-10-10 DIAGNOSIS — G4733 Obstructive sleep apnea (adult) (pediatric): Secondary | ICD-10-CM | POA: Diagnosis not present

## 2020-10-10 DIAGNOSIS — J301 Allergic rhinitis due to pollen: Secondary | ICD-10-CM | POA: Diagnosis not present

## 2020-10-10 DIAGNOSIS — E662 Morbid (severe) obesity with alveolar hypoventilation: Secondary | ICD-10-CM | POA: Diagnosis not present

## 2020-10-10 DIAGNOSIS — J4520 Mild intermittent asthma, uncomplicated: Secondary | ICD-10-CM | POA: Diagnosis not present

## 2020-10-10 DIAGNOSIS — G4761 Periodic limb movement disorder: Secondary | ICD-10-CM | POA: Diagnosis not present

## 2020-10-11 DIAGNOSIS — E559 Vitamin D deficiency, unspecified: Secondary | ICD-10-CM | POA: Diagnosis not present

## 2020-10-11 DIAGNOSIS — D518 Other vitamin B12 deficiency anemias: Secondary | ICD-10-CM | POA: Diagnosis not present

## 2020-10-11 DIAGNOSIS — N401 Enlarged prostate with lower urinary tract symptoms: Secondary | ICD-10-CM | POA: Diagnosis not present

## 2020-10-11 DIAGNOSIS — I10 Essential (primary) hypertension: Secondary | ICD-10-CM | POA: Diagnosis not present

## 2020-10-11 DIAGNOSIS — F3342 Major depressive disorder, recurrent, in full remission: Secondary | ICD-10-CM | POA: Diagnosis not present

## 2020-10-11 DIAGNOSIS — E782 Mixed hyperlipidemia: Secondary | ICD-10-CM | POA: Diagnosis not present

## 2020-10-11 DIAGNOSIS — Z8042 Family history of malignant neoplasm of prostate: Secondary | ICD-10-CM | POA: Diagnosis not present

## 2020-10-11 DIAGNOSIS — F331 Major depressive disorder, recurrent, moderate: Secondary | ICD-10-CM | POA: Diagnosis not present

## 2020-10-11 DIAGNOSIS — I25118 Atherosclerotic heart disease of native coronary artery with other forms of angina pectoris: Secondary | ICD-10-CM | POA: Diagnosis not present

## 2020-10-11 DIAGNOSIS — E1165 Type 2 diabetes mellitus with hyperglycemia: Secondary | ICD-10-CM | POA: Diagnosis not present

## 2020-10-11 DIAGNOSIS — E038 Other specified hypothyroidism: Secondary | ICD-10-CM | POA: Diagnosis not present

## 2020-10-11 DIAGNOSIS — R972 Elevated prostate specific antigen [PSA]: Secondary | ICD-10-CM | POA: Diagnosis not present

## 2020-10-19 DIAGNOSIS — F331 Major depressive disorder, recurrent, moderate: Secondary | ICD-10-CM | POA: Diagnosis not present

## 2020-10-19 DIAGNOSIS — E559 Vitamin D deficiency, unspecified: Secondary | ICD-10-CM | POA: Diagnosis not present

## 2020-10-19 DIAGNOSIS — E038 Other specified hypothyroidism: Secondary | ICD-10-CM | POA: Diagnosis not present

## 2020-10-19 DIAGNOSIS — I10 Essential (primary) hypertension: Secondary | ICD-10-CM | POA: Diagnosis not present

## 2020-10-19 DIAGNOSIS — D518 Other vitamin B12 deficiency anemias: Secondary | ICD-10-CM | POA: Diagnosis not present

## 2020-10-19 DIAGNOSIS — E291 Testicular hypofunction: Secondary | ICD-10-CM | POA: Diagnosis not present

## 2020-10-19 DIAGNOSIS — N401 Enlarged prostate with lower urinary tract symptoms: Secondary | ICD-10-CM | POA: Diagnosis not present

## 2020-10-19 DIAGNOSIS — E782 Mixed hyperlipidemia: Secondary | ICD-10-CM | POA: Diagnosis not present

## 2020-10-19 DIAGNOSIS — Z79899 Other long term (current) drug therapy: Secondary | ICD-10-CM | POA: Diagnosis not present

## 2020-10-19 DIAGNOSIS — F411 Generalized anxiety disorder: Secondary | ICD-10-CM | POA: Diagnosis not present

## 2020-10-19 DIAGNOSIS — E1165 Type 2 diabetes mellitus with hyperglycemia: Secondary | ICD-10-CM | POA: Diagnosis not present

## 2020-10-19 DIAGNOSIS — N3281 Overactive bladder: Secondary | ICD-10-CM | POA: Diagnosis not present

## 2020-10-26 DIAGNOSIS — R051 Acute cough: Secondary | ICD-10-CM | POA: Diagnosis not present

## 2020-10-26 DIAGNOSIS — Z20828 Contact with and (suspected) exposure to other viral communicable diseases: Secondary | ICD-10-CM | POA: Diagnosis not present

## 2020-11-08 DIAGNOSIS — B974 Respiratory syncytial virus as the cause of diseases classified elsewhere: Secondary | ICD-10-CM | POA: Diagnosis not present

## 2020-11-08 DIAGNOSIS — Z20828 Contact with and (suspected) exposure to other viral communicable diseases: Secondary | ICD-10-CM | POA: Diagnosis not present

## 2020-11-08 DIAGNOSIS — U071 COVID-19: Secondary | ICD-10-CM | POA: Diagnosis not present

## 2020-11-08 DIAGNOSIS — J101 Influenza due to other identified influenza virus with other respiratory manifestations: Secondary | ICD-10-CM | POA: Diagnosis not present

## 2020-11-20 ENCOUNTER — Emergency Department (HOSPITAL_COMMUNITY): Payer: PPO

## 2020-11-20 ENCOUNTER — Inpatient Hospital Stay (HOSPITAL_COMMUNITY)
Admission: EM | Admit: 2020-11-20 | Discharge: 2020-11-22 | DRG: 064 | Disposition: A | Discharge status: Home / Self Care | Payer: PPO | Attending: Neurology | Admitting: Neurology

## 2020-11-20 DIAGNOSIS — I2510 Atherosclerotic heart disease of native coronary artery without angina pectoris: Secondary | ICD-10-CM

## 2020-11-20 DIAGNOSIS — G9389 Other specified disorders of brain: Secondary | ICD-10-CM | POA: Diagnosis not present

## 2020-11-20 DIAGNOSIS — Z951 Presence of aortocoronary bypass graft: Secondary | ICD-10-CM | POA: Diagnosis not present

## 2020-11-20 DIAGNOSIS — Z79899 Other long term (current) drug therapy: Secondary | ICD-10-CM

## 2020-11-20 DIAGNOSIS — I452 Bifascicular block: Secondary | ICD-10-CM | POA: Diagnosis not present

## 2020-11-20 DIAGNOSIS — I639 Cerebral infarction, unspecified: Secondary | ICD-10-CM

## 2020-11-20 DIAGNOSIS — R4781 Slurred speech: Secondary | ICD-10-CM | POA: Diagnosis not present

## 2020-11-20 DIAGNOSIS — U071 COVID-19: Secondary | ICD-10-CM | POA: Diagnosis not present

## 2020-11-20 DIAGNOSIS — I6529 Occlusion and stenosis of unspecified carotid artery: Secondary | ICD-10-CM | POA: Diagnosis not present

## 2020-11-20 DIAGNOSIS — R29700 NIHSS score 0: Secondary | ICD-10-CM | POA: Diagnosis not present

## 2020-11-20 DIAGNOSIS — E785 Hyperlipidemia, unspecified: Secondary | ICD-10-CM

## 2020-11-20 DIAGNOSIS — I10 Essential (primary) hypertension: Secondary | ICD-10-CM | POA: Diagnosis not present

## 2020-11-20 DIAGNOSIS — D689 Coagulation defect, unspecified: Secondary | ICD-10-CM

## 2020-11-20 DIAGNOSIS — Z8679 Personal history of other diseases of the circulatory system: Secondary | ICD-10-CM | POA: Diagnosis not present

## 2020-11-20 DIAGNOSIS — Z832 Family history of diseases of the blood and blood-forming organs and certain disorders involving the immune mechanism: Secondary | ICD-10-CM

## 2020-11-20 DIAGNOSIS — I63411 Cerebral infarction due to embolism of right middle cerebral artery: Secondary | ICD-10-CM | POA: Diagnosis not present

## 2020-11-20 DIAGNOSIS — R42 Dizziness and giddiness: Secondary | ICD-10-CM | POA: Diagnosis not present

## 2020-11-20 DIAGNOSIS — G8324 Monoplegia of upper limb affecting left nondominant side: Secondary | ICD-10-CM | POA: Diagnosis not present

## 2020-11-20 DIAGNOSIS — R0902 Hypoxemia: Secondary | ICD-10-CM

## 2020-11-20 DIAGNOSIS — Z7982 Long term (current) use of aspirin: Secondary | ICD-10-CM

## 2020-11-20 DIAGNOSIS — H748X1 Other specified disorders of right middle ear and mastoid: Secondary | ICD-10-CM | POA: Diagnosis not present

## 2020-11-20 DIAGNOSIS — G4730 Sleep apnea, unspecified: Secondary | ICD-10-CM | POA: Diagnosis present

## 2020-11-20 DIAGNOSIS — I6300 Cerebral infarction due to thrombosis of unspecified precerebral artery: Secondary | ICD-10-CM | POA: Diagnosis not present

## 2020-11-20 DIAGNOSIS — G4489 Other headache syndrome: Secondary | ICD-10-CM | POA: Diagnosis not present

## 2020-11-20 DIAGNOSIS — R29810 Facial weakness: Secondary | ICD-10-CM | POA: Diagnosis present

## 2020-11-20 DIAGNOSIS — M1711 Unilateral primary osteoarthritis, right knee: Secondary | ICD-10-CM | POA: Diagnosis not present

## 2020-11-20 DIAGNOSIS — Z9282 Status post administration of tPA (rtPA) in a different facility within the last 24 hours prior to admission to current facility: Secondary | ICD-10-CM

## 2020-11-20 DIAGNOSIS — I4510 Unspecified right bundle-branch block: Secondary | ICD-10-CM | POA: Diagnosis not present

## 2020-11-20 DIAGNOSIS — R29708 NIHSS score 8: Secondary | ICD-10-CM | POA: Diagnosis not present

## 2020-11-20 DIAGNOSIS — R531 Weakness: Secondary | ICD-10-CM | POA: Diagnosis not present

## 2020-11-20 DIAGNOSIS — I6389 Other cerebral infarction: Secondary | ICD-10-CM | POA: Diagnosis not present

## 2020-11-20 DIAGNOSIS — Z888 Allergy status to other drugs, medicaments and biological substances status: Secondary | ICD-10-CM

## 2020-11-20 DIAGNOSIS — R29818 Other symptoms and signs involving the nervous system: Secondary | ICD-10-CM | POA: Diagnosis not present

## 2020-11-20 DIAGNOSIS — J3489 Other specified disorders of nose and nasal sinuses: Secondary | ICD-10-CM | POA: Diagnosis not present

## 2020-11-20 DIAGNOSIS — G8194 Hemiplegia, unspecified affecting left nondominant side: Secondary | ICD-10-CM | POA: Diagnosis not present

## 2020-11-20 DIAGNOSIS — R410 Disorientation, unspecified: Secondary | ICD-10-CM | POA: Diagnosis not present

## 2020-11-20 DIAGNOSIS — I480 Paroxysmal atrial fibrillation: Secondary | ICD-10-CM

## 2020-11-20 DIAGNOSIS — Z79890 Hormone replacement therapy (postmenopausal): Secondary | ICD-10-CM | POA: Diagnosis not present

## 2020-11-20 DIAGNOSIS — I63311 Cerebral infarction due to thrombosis of right middle cerebral artery: Secondary | ICD-10-CM | POA: Diagnosis not present

## 2020-11-20 DIAGNOSIS — Z886 Allergy status to analgesic agent status: Secondary | ICD-10-CM

## 2020-11-20 DIAGNOSIS — I6601 Occlusion and stenosis of right middle cerebral artery: Secondary | ICD-10-CM | POA: Diagnosis not present

## 2020-11-20 LAB — CBC
HCT: 34.6 % — ABNORMAL LOW (ref 39.0–52.0)
Hemoglobin: 11.1 g/dL — ABNORMAL LOW (ref 13.0–17.0)
MCH: 33.1 pg (ref 26.0–34.0)
MCHC: 32.1 g/dL (ref 30.0–36.0)
MCV: 103.3 fL — ABNORMAL HIGH (ref 80.0–100.0)
Platelets: 177 10*3/uL (ref 150–400)
RBC: 3.35 MIL/uL — ABNORMAL LOW (ref 4.22–5.81)
RDW: 15 % (ref 11.5–15.5)
WBC: 11 10*3/uL — ABNORMAL HIGH (ref 4.0–10.5)
nRBC: 0.5 % — ABNORMAL HIGH (ref 0.0–0.2)

## 2020-11-20 LAB — URINALYSIS, ROUTINE W REFLEX MICROSCOPIC
Bacteria, UA: NONE SEEN
Bilirubin Urine: NEGATIVE
Glucose, UA: NEGATIVE mg/dL
Ketones, ur: NEGATIVE mg/dL
Leukocytes,Ua: NEGATIVE
Nitrite: NEGATIVE
Protein, ur: 100 mg/dL — AB
Specific Gravity, Urine: 1.04 — ABNORMAL HIGH (ref 1.005–1.030)
pH: 5 (ref 5.0–8.0)

## 2020-11-20 LAB — DIFFERENTIAL
Abs Immature Granulocytes: 0.4 10*3/uL — ABNORMAL HIGH (ref 0.00–0.07)
Basophils Absolute: 0.1 10*3/uL (ref 0.0–0.1)
Basophils Relative: 1 %
Eosinophils Absolute: 0.1 10*3/uL (ref 0.0–0.5)
Eosinophils Relative: 1 %
Immature Granulocytes: 4 %
Lymphocytes Relative: 44 %
Lymphs Abs: 4.8 10*3/uL — ABNORMAL HIGH (ref 0.7–4.0)
Monocytes Absolute: 0.7 10*3/uL (ref 0.1–1.0)
Monocytes Relative: 6 %
Neutro Abs: 5 10*3/uL (ref 1.7–7.7)
Neutrophils Relative %: 44 %

## 2020-11-20 LAB — COMPREHENSIVE METABOLIC PANEL
ALT: 37 U/L (ref 0–44)
AST: 74 U/L — ABNORMAL HIGH (ref 15–41)
Albumin: 2.8 g/dL — ABNORMAL LOW (ref 3.5–5.0)
Alkaline Phosphatase: 50 U/L (ref 38–126)
Anion gap: 9 (ref 5–15)
BUN: 17 mg/dL (ref 8–23)
CO2: 23 mmol/L (ref 22–32)
Calcium: 8.4 mg/dL — ABNORMAL LOW (ref 8.9–10.3)
Chloride: 101 mmol/L (ref 98–111)
Creatinine, Ser: 1.39 mg/dL — ABNORMAL HIGH (ref 0.61–1.24)
GFR, Estimated: 55 mL/min — ABNORMAL LOW (ref >60)
Glucose, Bld: 109 mg/dL — ABNORMAL HIGH (ref 70–99)
Potassium: 4.5 mmol/L (ref 3.5–5.1)
Sodium: 133 mmol/L — ABNORMAL LOW (ref 135–145)
Total Bilirubin: 1 mg/dL (ref 0.3–1.2)
Total Protein: 7.7 g/dL (ref 6.5–8.1)

## 2020-11-20 LAB — ETHANOL: Alcohol, Ethyl (B): <10 mg/dL (ref <10)

## 2020-11-20 LAB — PROTIME-INR
INR: 5.5 (ref 0.8–1.2)
Prothrombin Time: 48.2 seconds — ABNORMAL HIGH (ref 11.4–15.2)

## 2020-11-20 LAB — RAPID URINE DRUG SCREEN, HOSP PERFORMED
Amphetamines: NOT DETECTED
Barbiturates: NOT DETECTED
Benzodiazepines: NOT DETECTED
Cocaine: NOT DETECTED
Opiates: NOT DETECTED
Tetrahydrocannabinol: NOT DETECTED

## 2020-11-20 LAB — APTT: aPTT: 68 seconds — ABNORMAL HIGH (ref 24–36)

## 2020-11-20 MED ORDER — IOHEXOL 350 MG/ML SOLN
40.0000 mL | Freq: Once | INTRAVENOUS | Status: AC | PRN
  Administered 2020-11-20: 40 mL via INTRAVENOUS
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
## 2020-11-20 NOTE — ED Notes (Signed)
Taken to CT.

## 2020-11-20 NOTE — ED Provider Notes (Signed)
[facility] EMERGENCY DEPARTMENT Provider Note   CSN: [id number] Arrival date & time: 11/20/20  2121     History Chief Complaint  Patient presents with  . Code Stroke    Tx from [place] for LVO-- got tPA    Lance Pham is a 70 y.o. male.    Presents to ER as transfer from [place].  History obtained from report, last known well 6 PM, sudden onset of left-sided weakness, CT head was negative, given tPA around 7:30 PM.  CTA performed and in process at time of signout.  COVID positive.  Patient states he feels slightly nauseous but otherwise denies any acute pain.  Feels slightly weak in his left arm still but thinks this may have improved.  History limited due to acuity.  Level 5 caveat.        IMPRESSION: 1. Positive CTA for emergent large vessel occlusion, with abrupt focal cutoff of the right MCA bifurcation, concerning for occlusive thrombus. Extension into right M2 branch, inferior division, with possible downstream distal right M3 and/or M4 branch occlusions. 2. Mild atherosclerotic change about the carotid bifurcations and carotid siphons without hemodynamically significant stenosis. 3. 5 mm left upper lobe nodule, indeterminate. No follow-up needed if patient is low-risk. Non-contrast chest CT can be considered in 12 months if patient is high-risk. This recommendation follows the consensus statement: Guidelines for Management of Incidental Pulmonary Nodules Detected on CT Images: From the Fleischner Society 2017; Radiology 2017; 284:228-243.  Critical Value/emergent results were called by telephone at the time of interpretation on 11/20/2020 at 8:46 p.m. to provider Fayette Pho MD, who verbally acknowledged these results.    HPI     Past Medical History:  Diagnosis Date  . A-fib (HCC)    hx of  . Arthritis    RIGHT knee  . BBB (bundle branch block)    hx of R BBB  . Cataract    bilateral sx  . Clotting disorder [facility])     has a genetic clotting dx -unknown name  . Hypertension    on meds  . PONV (postoperative nausea and vomiting)   . Sleep apnea    use CPAP    Patient Active Problem List   Diagnosis Date Noted  . Episodic lightheadedness 10/14/2019  . 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 Palpitations 10/14/2019  . RBBB (right bundle branch block) 10/14/2019  . CAD in native artery 06/22/2019  . Paroxysmal atrial fibrillation ([place]) 06/22/2019  . Shortness of breath 06/08/2019  . Abnormal myocardial perfusion study 07/14/2018  . Chest discomfort 07/14/2018  . Dyslipidemia 07/14/2018  . Essential hypertension 07/14/2018  . Hyperprolactinemia ([place]) 06/02/2017  . Pituitary microadenoma ([place]) 06/02/2017    Past Surgical History:  Procedure Laterality Date  . CATARACT EXTRACTION, BILATERAL    . CHOLECYSTECTOMY    . COLONOSCOPY  2017   RG-mag citrtae (good)-TICS/TA x 1  . KNEE SURGERY Right   . LAPAROSCOPIC GASTRIC BANDING    . POLYPECTOMY  2017   TA x 1  . WISDOM TOOTH EXTRACTION    . WRIST RECONSTRUCTION Right        Family History  Problem Relation Age of Onset  . Colon cancer Mother 62  . Colon polyps Mother 35  . Thyroid cancer Father 75       parathyroid CA  . Colon polyps Sister   . Colon polyps Brother   . Colon polyps Sister   . Colon polyps Sister   .  Colon polyps Sister   . Esophageal cancer Neg Hx   . Stomach cancer Neg Hx   . Rectal cancer Neg Hx     Social History   Tobacco Use  . Smoking status: Never Smoker  . Smokeless tobacco: Never Used  Vaping Use  . Vaping Use: Never used  Substance Use Topics  . Alcohol use: Not Currently  . Drug use: Not Currently    Home Medications Prior to Admission medications   Medication Sig Start Date End Date Taking? Authorizing Provider  aspirin EC 81 MG tablet Take 81 mg by mouth at bedtime. Swallow whole.   Yes [provider]  atorvastatin (LIPITOR) 20 MG tablet Take 20 mg by mouth at  bedtime.   Yes [provider]  buPROPion (WELLBUTRIN) 75 MG tablet Take 75 mg by mouth at bedtime. 05/17/18  Yes [provider]  Cholecalciferol (VITAMIN D3) 125 MCG (5000 UT) CAPS Take 5,000 Units by mouth at bedtime.   Yes [provider]  ferrous sulfate 324 (65 Fe) MG TBEC Take 65 mg by mouth daily after supper.   Yes [provider]  FLUoxetine (PROZAC) 40 MG capsule Take 40 mg by mouth at bedtime. 01/16/20  Yes [provider]  folic acid (FOLVITE) 433 MCG tablet Take 400 mcg by mouth at bedtime.   Yes [provider]  lisinopril (ZESTRIL) 10 MG tablet Take 10 mg by mouth at bedtime.   Yes [provider]  metoprolol succinate (TOPROL-XL) 25 MG 24 hr tablet Take 25 mg by mouth at bedtime. 09/08/20  Yes [provider]  ranolazine (RANEXA) 500 MG 12 hr tablet Take 500 mg by mouth at bedtime. 01/16/20  Yes [provider]  tamsulosin (FLOMAX) 0.4 MG CAPS capsule Take 0.4 mg by mouth at bedtime. 09/11/20  Yes [provider]  testosterone cypionate (DEPOTESTOTERONE CYPIONATE) 100 MG/ML injection Inject 100 mg into the muscle every Thursday. For IM use only   Yes [provider]  tolterodine (DETROL LA) 4 MG 24 hr capsule Take 4 mg by mouth at bedtime. 07/06/18  Yes [provider]    Allergies    Nsaids and Tamiflu [oseltamivir]  Review of Systems   Review of Systems  Unable to perform ROS: Acuity of condition    Physical Exam Updated Vital Signs BP (!) 163/64   Pulse (!) 51   Temp (!) 97.3 F (36.3 C) (Temporal)   Resp 17   SpO2 96%   Physical Exam Vitals and nursing note reviewed.  Constitutional:      Appearance: He is well-developed and well-nourished.  HENT:     Head: Normocephalic and atraumatic.  Eyes:     Conjunctiva/sclera: Conjunctivae normal.  Cardiovascular:     Rate and Rhythm: Normal rate and regular rhythm.     Heart sounds: No murmur heard.   Pulmonary:      Effort: Pulmonary effort is normal. No respiratory distress.     Breath sounds: Normal breath sounds.  Abdominal:     Palpations: Abdomen is soft.     Tenderness: There is no abdominal tenderness.  Musculoskeletal:        General: No edema.     Cervical back: Neck supple.  Skin:    General: Skin is warm and dry.  Neurological:     Mental Status: He is alert.     Comments: AAOx3 CN 2-12 intact, speech clear visual fields intact 5/5 strength in b/l UE and LE Sensation to light  touch intact in b/l UE and LE Normal FNF  NIHSS score 0   Psychiatric:        Mood and Affect: Mood and affect normal.     ED Results / Procedures / Treatments   Labs (all labs ordered are listed, but only abnormal results are displayed) Labs Reviewed  CBC - Abnormal; Notable for the following components:      Result Value   WBC 11.0 (*)    RBC 3.35 (*)    Hemoglobin 11.1 (*)    HCT 34.6 (*)    MCV 103.3 (*)    nRBC 0.5 (*)    All other components within normal limits  DIFFERENTIAL - Abnormal; Notable for the following components:   Lymphs Abs 4.8 (*)    Abs Immature Granulocytes 0.40 (*)    All other components within normal limits  URINALYSIS, ROUTINE W REFLEX MICROSCOPIC - Abnormal; Notable for the following components:   APPearance HAZY (*)    Specific Gravity, Urine 1.040 (*)    Hgb urine dipstick SMALL (*)    Protein, ur 100 (*)    All other components within normal limits  RAPID URINE DRUG SCREEN, HOSP PERFORMED  ETHANOL  PROTIME-INR  APTT  COMPREHENSIVE METABOLIC PANEL    EKG None  Radiology CT CEREBRAL PERFUSION W CONTRAST  Result Date: 11/20/2020 CLINICAL DATA:  Follow-up examination for acute stroke, right MCA occlusion on prior CTA. EXAM: CT PERFUSION BRAIN TECHNIQUE: Multiphase CT imaging of the brain was performed following IV bolus contrast injection. Subsequent parametric perfusion maps were calculated using RAPID software. CONTRAST:  24mL OMNIPAQUE IOHEXOL 350  MG/ML SOLN COMPARISON:  Comparison made with prior CT and CTA from earlier the same day. FINDINGS: CT Brain Perfusion Findings: CBF (<30%) Volume: 51mL Perfusion (Tmax>6.0s) volume: 43mL Mismatch Volume: 13mL ASPECTS on noncontrast CT Head: 10 at 7:08 p.m. today. Infarct Core: 0 mL Infarction Location:Apparent 50 cc perfusion deficit seen involving the mid and posterior right MCA distribution, involving the posterior right frontoparietal region. Finding is in keeping with the previously identified right MCA occlusion. No evidence for acute core infarct by CT perfusion. IMPRESSION: 50 cc perfusion deficit involving the mid and posterior right MCA distribution, in keeping with the previously identified right MCA occlusion. No evidence for acute core infarct by CT perfusion. Electronically Signed   By: Jeannine Boga M.D.   On: 11/20/2020 23:13     Procedures .Critical Care Performed by: Lucrezia Starch, MD Authorized by: Lucrezia Starch, MD   Critical care provider statement:    Critical care time (minutes):  42   Critical care was necessary to treat or prevent imminent or life-threatening deterioration of the following conditions:  CNS failure or compromise   Critical care was time spent personally by me on the following activities:  Discussions with consultants, evaluation of patient's response to treatment, examination of patient, ordering and performing treatments and interventions, ordering and review of laboratory studies, ordering and review of radiographic studies, pulse oximetry, re-evaluation of patient's condition, obtaining history from patient or surrogate and review of old charts   (including critical care time)  Medications Ordered in ED Medications  iohexol (OMNIPAQUE) 350 MG/ML injection 40 mL (40 mLs Intravenous Contrast Given 11/20/20 2229)    ED Course  I have reviewed the triage vital signs and the nursing notes.  Pertinent labs & imaging results that were  available during my care of the patient were reviewed by me and considered in my medical decision  making (see chart for details).  Clinical Course as of 11/20/20 2338  Tue Nov 20, 2020  2204 Theda Sers said he was able to get a hold of neuro IR - recommending defer intervention now, can check CT-P - will get CT-P; neuro said no need for stroke alert [RD]    Clinical Course User Index [RD] Lucrezia Starch, MD   MDM Rules/Calculators/A&P                          70 year old male presents to ER as transfer from [place] for a acute stroke.  Received tPA prior to arrival.  NIH score on my exam presently is 0.  Upon patient arrival, notified our neurologist Dr. Theda Sers who was already aware and expecting patient.  We reviewed the CT angio results through inteleconnect which were concerning for  Positive CTA for emergent large vessel occlusion, with abrupt focal cutoff of the right MCA bifurcation, concerning for occlusive thrombus.  He reviewed the case with the neuro interventionalist and they decided not to take patient to IR.  Will check CT perfusion per neurology recommendation.  Will be admitted to neuro ICU under care of of neurology.  Upon further report, patient had COVID symptoms and a positive COVID test a few weeks ago but has been asymptomatic for a while.  Suspect this was an incidental finding.  Final Clinical Impression(s) / ED Diagnoses Final diagnoses:  Cerebrovascular accident (CVA), unspecified mechanism ([place])  Large vessel stroke ([place])  COVID-19    Rx / DC Orders ED Discharge Orders    None       Lucrezia Starch, MD 11/20/20 2339

## 2020-11-20 NOTE — ED Notes (Signed)
Patient transferred from [facility]. Patient had tPA PTA, +LVO.   LKW 1800 1/18  NIH PTA 6  tPA given at 1930- 9mg  bolus given. 1935- 81mg  given. 37mL NS given as well.    Patient A0x4 at this time, able to follow commands  Denies any pain

## 2020-11-20 NOTE — ED Notes (Signed)
[phone] Juliann Pulse wife would like a call when an update is available

## 2020-11-21 ENCOUNTER — Other Ambulatory Visit (HOSPITAL_COMMUNITY): Payer: PPO

## 2020-11-21 ENCOUNTER — Inpatient Hospital Stay (HOSPITAL_COMMUNITY): Payer: PPO

## 2020-11-21 DIAGNOSIS — G8324 Monoplegia of upper limb affecting left nondominant side: Secondary | ICD-10-CM | POA: Diagnosis present

## 2020-11-21 DIAGNOSIS — I480 Paroxysmal atrial fibrillation: Secondary | ICD-10-CM | POA: Diagnosis present

## 2020-11-21 DIAGNOSIS — Z79890 Hormone replacement therapy (postmenopausal): Secondary | ICD-10-CM | POA: Diagnosis not present

## 2020-11-21 DIAGNOSIS — I639 Cerebral infarction, unspecified: Secondary | ICD-10-CM | POA: Diagnosis present

## 2020-11-21 DIAGNOSIS — R29700 NIHSS score 0: Secondary | ICD-10-CM | POA: Diagnosis present

## 2020-11-21 DIAGNOSIS — I10 Essential (primary) hypertension: Secondary | ICD-10-CM | POA: Diagnosis present

## 2020-11-21 DIAGNOSIS — I6300 Cerebral infarction due to thrombosis of unspecified precerebral artery: Secondary | ICD-10-CM | POA: Diagnosis not present

## 2020-11-21 DIAGNOSIS — G4730 Sleep apnea, unspecified: Secondary | ICD-10-CM | POA: Diagnosis present

## 2020-11-21 DIAGNOSIS — Z9282 Status post administration of tPA (rtPA) in a different facility within the last 24 hours prior to admission to current facility: Secondary | ICD-10-CM | POA: Diagnosis not present

## 2020-11-21 DIAGNOSIS — Z888 Allergy status to other drugs, medicaments and biological substances status: Secondary | ICD-10-CM | POA: Diagnosis not present

## 2020-11-21 DIAGNOSIS — E785 Hyperlipidemia, unspecified: Secondary | ICD-10-CM | POA: Diagnosis present

## 2020-11-21 DIAGNOSIS — Z832 Family history of diseases of the blood and blood-forming organs and certain disorders involving the immune mechanism: Secondary | ICD-10-CM | POA: Diagnosis not present

## 2020-11-21 DIAGNOSIS — D689 Coagulation defect, unspecified: Secondary | ICD-10-CM | POA: Diagnosis present

## 2020-11-21 DIAGNOSIS — Z7982 Long term (current) use of aspirin: Secondary | ICD-10-CM | POA: Diagnosis not present

## 2020-11-21 DIAGNOSIS — U071 COVID-19: Secondary | ICD-10-CM | POA: Diagnosis not present

## 2020-11-21 DIAGNOSIS — M1711 Unilateral primary osteoarthritis, right knee: Secondary | ICD-10-CM | POA: Diagnosis present

## 2020-11-21 DIAGNOSIS — I6389 Other cerebral infarction: Secondary | ICD-10-CM | POA: Diagnosis not present

## 2020-11-21 DIAGNOSIS — Z951 Presence of aortocoronary bypass graft: Secondary | ICD-10-CM | POA: Diagnosis not present

## 2020-11-21 DIAGNOSIS — Z79899 Other long term (current) drug therapy: Secondary | ICD-10-CM | POA: Diagnosis not present

## 2020-11-21 DIAGNOSIS — Z886 Allergy status to analgesic agent status: Secondary | ICD-10-CM | POA: Diagnosis not present

## 2020-11-21 DIAGNOSIS — I2510 Atherosclerotic heart disease of native coronary artery without angina pectoris: Secondary | ICD-10-CM | POA: Diagnosis present

## 2020-11-21 DIAGNOSIS — R29810 Facial weakness: Secondary | ICD-10-CM | POA: Diagnosis present

## 2020-11-21 DIAGNOSIS — I63411 Cerebral infarction due to embolism of right middle cerebral artery: Secondary | ICD-10-CM | POA: Diagnosis present

## 2020-11-21 DIAGNOSIS — I452 Bifascicular block: Secondary | ICD-10-CM | POA: Diagnosis present

## 2020-11-21 HISTORY — DX: Cerebral infarction, unspecified: I63.9

## 2020-11-21 LAB — LIPID PANEL
Cholesterol: 113 mg/dL (ref 0–200)
HDL: 22 mg/dL — ABNORMAL LOW (ref >40)
LDL Cholesterol: 72 mg/dL (ref 0–99)
Total CHOL/HDL Ratio: 5.1 RATIO
Triglycerides: 96 mg/dL (ref <150)
VLDL: 19 mg/dL (ref 0–40)

## 2020-11-21 LAB — PHOSPHORUS: Phosphorus: 4.1 mg/dL (ref 2.5–4.6)

## 2020-11-21 LAB — TROPONIN I (HIGH SENSITIVITY)
Troponin I (High Sensitivity): 39 ng/L — ABNORMAL HIGH (ref <18)
Troponin I (High Sensitivity): 49 ng/L — ABNORMAL HIGH (ref <18)

## 2020-11-21 LAB — HEMOGLOBIN A1C
Hgb A1c MFr Bld: 5.8 % — ABNORMAL HIGH (ref 4.8–5.6)
Mean Plasma Glucose: 119.76 mg/dL

## 2020-11-21 LAB — BRAIN NATRIURETIC PEPTIDE: B Natriuretic Peptide: 237.5 pg/mL — ABNORMAL HIGH (ref 0.0–100.0)

## 2020-11-21 LAB — LACTATE DEHYDROGENASE: LDH: 279 U/L — ABNORMAL HIGH (ref 98–192)

## 2020-11-21 LAB — MAGNESIUM: Magnesium: 1.5 mg/dL — ABNORMAL LOW (ref 1.7–2.4)

## 2020-11-21 LAB — C-REACTIVE PROTEIN: CRP: 0.5 mg/dL (ref <1.0)

## 2020-11-21 LAB — FERRITIN: Ferritin: 312 ng/mL (ref 24–336)

## 2020-11-21 LAB — HIV ANTIBODY (ROUTINE TESTING W REFLEX): HIV Screen 4th Generation wRfx: NONREACTIVE

## 2020-11-21 MED ORDER — BENZONATATE 100 MG PO CAPS: 100.0000 mg | ORAL_CAPSULE | Freq: Three times a day (TID) | ORAL | Status: DC | PRN

## 2020-11-21 MED ORDER — SENNOSIDES-DOCUSATE SODIUM 8.6-50 MG PO TABS
1.0000 | ORAL_TABLET | Freq: Every day | ORAL | Status: DC
  Administered 2020-11-21: 1 via ORAL
  Filled 2020-11-21: qty 1

## 2020-11-21 MED ORDER — SODIUM CHLORIDE 0.9 % IV SOLN: INTRAVENOUS | Status: DC

## 2020-11-21 MED ORDER — PANTOPRAZOLE SODIUM 40 MG IV SOLR
40.0000 mg | Freq: Every day | INTRAVENOUS | Status: DC
  Administered 2020-11-21 (×2): 40 mg via INTRAVENOUS
  Filled 2020-11-21 (×2): qty 40

## 2020-11-21 MED ORDER — NICARDIPINE HCL IN NACL 20-0.86 MG/200ML-% IV SOLN: 0.0000 mg/h | INTRAVENOUS | Status: DC

## 2020-11-21 MED ORDER — LORAZEPAM 2 MG/ML IJ SOLN
2.0000 mg | Freq: Once | INTRAMUSCULAR | Status: AC
  Administered 2020-11-21: 2 mg via INTRAVENOUS
  Filled 2020-11-21: qty 1

## 2020-11-21 MED ORDER — ACETAMINOPHEN 650 MG RE SUPP: 650.0000 mg | RECTAL | Status: DC | PRN

## 2020-11-21 MED ORDER — ACETAMINOPHEN 160 MG/5ML PO SOLN: 650.0000 mg | ORAL | Status: DC | PRN

## 2020-11-21 MED ORDER — ACETAMINOPHEN 325 MG PO TABS: 650.0000 mg | ORAL_TABLET | ORAL | Status: DC | PRN

## 2020-11-21 MED ORDER — GUAIFENESIN ER 600 MG PO TB12
600.0000 mg | ORAL_TABLET | Freq: Two times a day (BID) | ORAL | Status: DC
  Administered 2020-11-21 – 2020-11-22 (×2): 600 mg via ORAL
  Filled 2020-11-21 (×2): qty 1

## 2020-11-21 MED ORDER — STROKE: EARLY STAGES OF RECOVERY BOOK: Freq: Once | Status: AC

## 2020-11-21 MED ORDER — LABETALOL HCL 5 MG/ML IV SOLN: 20.0000 mg | Freq: Once | INTRAVENOUS | Status: DC

## 2020-11-21 NOTE — Progress Notes (Signed)
PT Cancellation Note  Patient Details Name: SIRCHARLES HOLZHEIMER MRN: [id number] DOB: 1951-09-21   Cancelled Treatment:    Reason Eval/Treat Not Completed: Active bedrest order Pt current bed rest orders. Will follow up as activity orders updated and as schedule allows.   Lou Miner, DPT  Acute Rehabilitation Services  Pager: [phone] Office: [phone]    Rudean Hitt 11/21/2020, 2:33 PM

## 2020-11-21 NOTE — ED Notes (Signed)
Pt wife wants an update! [phone]

## 2020-11-21 NOTE — ED Notes (Signed)
Please call daughter Cyril Mourning @ [phone] for a status update

## 2020-11-21 NOTE — ED Notes (Signed)
Pt daughter wants to request 2nd covid test her callback number is [phone]

## 2020-11-21 NOTE — Progress Notes (Addendum)
STROKE TEAM PROGRESS NOTE   INTERVAL HISTORY Admitted s/p tPA at [facility] for left arm weakness and difficulty speaking he was treated with IV tPA and is often substantial improvement in his left-sided weakness.  States he is almost back to his baseline.  He tested positive for COVID Patient is sitting up on ED stretcher wearing NRB mask. Mildly drowsy. Currently reports is left sided weakness and difficulty speaking remain resolved.  MRI shows tiny right temporal cortical embolic infarct.  Patient has prior history of paroxysmal A. fib but has not been on anticoagulation.  Wife reports patient has been coughing, weak and in bed x 10 days prior to admission. He had 3 negative covid tests performed at 3 different sites (MD office, drive through, urgent care)  during that time. She also states her son died of a PE (after filter placed) 4 years ago. Daughter has had serious episode of PE.   Daughter reports she has "heterozygous thrombophilia"  Vitals:   11/21/20 0500 11/21/20 0600 11/21/20 0700 11/21/20 0840  BP: (!) 150/75 (!) 161/78 (!) 152/79 (!) 162/76  Pulse: (!) 49 (!) 47 (!) 55 (!) 46  Resp: (!) 25 (!) 21 16 18   Temp:    97.6 F (36.4 C)  TempSrc:    Temporal  SpO2: 92% 93% 95% 100%   CBC:  Recent Labs  Lab 11/20/20 2253  WBC 11.0*  NEUTROABS 5.0  HGB 11.1*  HCT 34.6*  MCV 103.3*  PLT 885   Basic Metabolic Panel:  Recent Labs  Lab 11/20/20 2253 11/21/20 0216  NA 133*  --   K 4.5  --   CL 101  --   CO2 23  --   GLUCOSE 109*  --   BUN 17  --   CREATININE 1.39*  --   CALCIUM 8.4*  --   MG  --  1.5*  PHOS  --  4.1   Lipid Panel:  Recent Labs  Lab 11/21/20 0216  CHOL 113  TRIG 96  HDL 22*  CHOLHDL 5.1  VLDL 19  LDLCALC 72   HgbA1c:  Recent Labs  Lab 11/21/20 0216  HGBA1C 5.8*   Urine Drug Screen:  Recent Labs  Lab 11/20/20 2253  LABOPIA NONE DETECTED  COCAINSCRNUR NONE DETECTED  LABBENZ NONE DETECTED  AMPHETMU NONE DETECTED  THCU NONE  DETECTED  LABBARB NONE DETECTED     0 Result Notes   Ref Range & Units 02:16  Troponin I (High Sensitivity) <18 ng/L 39High       11/20/20 23:51 Prothrombin Time 11.4 - 15.2 seconds 48.2High   INR 0.8 - 1.2 5.5High Panic   Comment: REPEATED TO VERIFY    aPTT 24 - 36 seconds 68High   Alcohol Level  Recent Labs  Lab 11/20/20 2253  ETH <10    IMAGING past 24 hours MR BRAIN WO CONTRAST  Result Date: 11/21/2020 CLINICAL DATA:  Stroke follow-up, right MCA occlusion post tPA EXAM: MRI HEAD WITHOUT CONTRAST TECHNIQUE: Multiplanar, multiecho pulse sequences of the brain and surrounding structures were obtained without intravenous contrast. COMPARISON:  MRI 05/20/2017, head CT 11/20/2020 FINDINGS: Motion artifact is present. Brain: Small focus of right superior temporal cortical diffusion hyperintensity. There is no evidence of intracranial hemorrhage. Ventricles and sulci are within normal limits in size and configuration. There is no intracranial mass or mass effect. There is no hydrocephalus or extra-axial fluid collection. Vascular: Major vessel flow voids at the skull base are preserved. Skull and upper cervical spine:  Normal marrow signal is preserved. Sinuses/Orbits: Paranasal sinus mucosal thickening. Bilateral lens replacements. Other: Sella is unremarkable. Pituitary microadenoma on prior study is not well evaluated on this examination. Patchy mastoid fluid opacification. IMPRESSION: Small cortical acute to subacute infarct of the right temporal lobe. No hemorrhage. Electronically Signed   By: Macy Mis M.D.   On: 11/21/2020 08:35   CT CEREBRAL PERFUSION W CONTRAST  Result Date: 11/20/2020 CLINICAL DATA:  Follow-up examination for acute stroke, right MCA occlusion on prior CTA. EXAM: CT PERFUSION BRAIN TECHNIQUE: Multiphase CT imaging of the brain was performed following IV bolus contrast injection. Subsequent parametric perfusion maps were calculated using RAPID software.  CONTRAST:  6mL OMNIPAQUE IOHEXOL 350 MG/ML SOLN COMPARISON:  Comparison made with prior CT and CTA from earlier the same day. FINDINGS: CT Brain Perfusion Findings: CBF (<30%) Volume: 18mL Perfusion (Tmax>6.0s) volume: 86mL Mismatch Volume: 55mL ASPECTS on noncontrast CT Head: 10 at 7:08 p.m. today. Infarct Core: 0 mL Infarction Location:Apparent 50 cc perfusion deficit seen involving the mid and posterior right MCA distribution, involving the posterior right frontoparietal region. Finding is in keeping with the previously identified right MCA occlusion. No evidence for acute core infarct by CT perfusion. IMPRESSION: 50 cc perfusion deficit involving the mid and posterior right MCA distribution, in keeping with the previously identified right MCA occlusion. No evidence for acute core infarct by CT perfusion. Electronically Signed   By: Jeannine Boga M.D.   On: 11/20/2020 23:13    PHYSICAL EXAM: Obese middle-aged Caucasian male not in distress.  He is on oxygen. . Afebrile. Head is nontraumatic. Neck is supple without bruit.    Cardiac exam no murmur or gallop. Lungs are clear to auscultation. Distal pulses are well felt. Neurological Exam ;  Awake  Alert oriented x 3. Normal speech and language.eye movements full without nystagmus.fundi were not visualized. Vision acuity and fields appear normal. Hearing is normal. Palatal movements are normal. Face symmetric. Tongue midline. Normal strength, tone, reflexes and coordination. Normal sensation. Gait deferred.   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  Acute embolic stroke and occlusion in right MCA distal  M1/proximal M2 likely due to atrial fibrillation (one witnessed event 2020), possible hypercoagulable disease s/p tPA at [facility] 11/20/20   Code Stroke CT head No acute abnormality at OSH.   CTA head & neck showed head and neck showed occlusion in right MCA distal M1/proximal M2  CT perfusion: 50 cc perfusion deficit involving the mid and posterior right MCAdistribution, in keeping with the previously identified right MCA  occlusion. No evidence for acute core infarct by CT perfusion.  MRI: Small cortical acute to subacute infarct of the right temporal lobe. No hemorrhage.  Carotid Doppler    2D Echo PENDING  LDL 72  HgbA1c 5.8  VTE prophylaxis is recommended.     Diet   Diet NPO time specified   Post tPA blood pressure: <180/152mm Hg for first 24 hours the gradual reduction over next few days.  Therapy recommendations:  TBD  Disposition:  TBD  On ASA 81 mg prior to admission    Mildly elevated troponin, Bradycardia and history of paroxysmal atrial fibrillation episode  Follow  troponin   EKG repeated  Per chart review EF Mar 2021 60-65%w has hx of  baseline sinus bradycardia with heart rates in 50s and bifascicular block. 2020 underwent a ZIO monitoring. Zio patch showed PVCs, one episode of NSVT. Underwent coronary angiography in August 2020 ([facility]) when he was noted to have distal LAD disease. The involved vessel was less than 1 mm and was determined to be the unlikely etiology for ischemia that was not amenable for revascularization. Otherwise nonobstructive coronary disease. Episode of Paroxysmal Atrial Fibrillation-converted to NSR during the procedure.   On Toprol XL prior to admission  Hypertension  Home meds: Toprol XL 25 mg daily, Lisinopril 10mg  daily,   Stable . Post tPA goal 180/105 then permissive hypertension (OK if < 220/120) but gradually normalize in 5-7 days . Long-term BP goal normotensive  Hyperlipidemia  Home meds:  Lipitor 20mg  resumed in hospital  LDL 72, goal < 70  Continue statin at discharge  Possible clotting disorder  Family history of clotting disorder with son dying from PE and daughter surviving PE. Wife thinks its Factor V Leiden issue but states patient never tested. Daughter Juanna Cao, did not answer phone [phone]).   Other Stroke Risk Factors  Advanced Age >/= 48   Cigarette smoker -was advised to stop smoking  ETOH use, alcohol level <10, advised to drink no more than 1-2 drink(s) a day  Substance abuse - UDS:  THC NONE DETECTED, Cocaine NONE DETECTED. Patient advised to stop using due to stroke risk.  Obesity, There is no height or weight on file to calculate BMI., BMI >/= 30 associated with increased stroke risk, recommend weight loss, diet and exercise as appropriate   Hx stroke/TIA  Family hx stroke   Coronary artery disease  Other Active Problems  COVID positive    NRB mask with 100% saturation    Hospital day # 0 I have personally obtained history,examined this patient, reviewed notes, independently viewed imaging studies, participated in medical decision making and plan of care.ROS completed by me personally and pertinent positives fully documented  I have made any additions or clarifications directly to the above note. Agree with note above.  He presented with sudden onset of left hemiparesis secondary to embolic right temporal MCA branch infarct given prior history of paroxysmal A. fib likely embolic.  He is also tested positive for COVID-19 infection hypercoagulability.  Continue close neurological monitoring and strict blood pressure control as per post tPA protocol.  Mobilize out of bed.  Therapy consults.  Continue ongoing stroke work-up.  Likely need Eliquis for long-term anticoagulation.  Medical hospitalist team consulted to help manage his COVID infection.  I spoke with patient's wife over the phone and obtain additional history from her and  updated her about his condition and answered questions. This patient is critically ill and at significant risk of neurological worsening, death and care requires constant monitoring of vital signs, hemodynamics,respiratory and cardiac monitoring, extensive review of multiple databases, frequent neurological assessment, discussion with family, other specialists and medical decision making of high complexity.I have made any additions or clarifications directly to the above note.This critical care time does not reflect procedure time, or teaching time or supervisory time of PA/NP/Med Resident etc but could involve care discussion time.  I spent 30 minutes of neurocritical care time  in the care of  this patient.     Antony Contras, MD Medical Director [facility] Pager: [phone] 11/21/2020 4:01 PM   To contact Stroke  Continuity provider, please refer to [email or web address] After hours, contact General Neurology

## 2020-11-21 NOTE — Consult Note (Signed)
[facility] Medical Consultation  Lance Pham BPZ:[id number] DOB: [birth year] DOA: 11/20/2020 PCP: Bonnita Nasuti, MD   Requesting physician: Dr. Leonie Man Date of consultation: 11/21/2020 Reason for consultation: COVID positive  Impression/Recommendations Active Problems:   Stroke (cerebrum) ([place])   COVID infection -Seems to have been symptomatic for about 3 weeks since Xmas, still having some dry cough, but no s/s of overt PNA. -Symptomatic management -COVID-19 labs  Acute/subacute right cortical temporal lobe -Status post tPA -As needed BP meds  Uncontrolled HTN -On Cardene drip and will be admitted to Neuro ICU for close monitoring.  CAD with borderline elevation of Trop -No chest pain -EKG borderline bradycardia and frequent PVCs -Resume beta-blocker when allows PO meds  Hx of PAF -Anticoagulation on discharge  I will followup again tomorrow. Please contact me if I can be of assistance in the meanwhile. Thank you for this consultation.  Chief Complaint: Feeling better  HPI:  70 year old, with past medical history of CAD status post CABG, paroxysmal A. fib not on anticoagulations, HTN, HLD, presented with left-sided weakness and slurred speech.  He went to [facility] ED, code stroke was called and tPA was given and patient admitted to neuro ICU last night and shifted to [facility] .  Yesterday, patient's COVID screening came back positive.  Patient reported he has been having cough, but denies any shortness of breath, no chest pains.  Before that, patient had 2+m weeks, now has been having symptoms of dry cough, feeling weak, and went to COVID-19 tested x3 in the last 2 weeks negative.  Denies any fever chills, no diarrhea no loss of taste.  Patient was COVID vaccinated x2.  Review of Systems:  14 points with some negative except those mentioned HPI  Past Medical History:  Diagnosis Date  . A-fib (HCC)    hx of  . Arthritis    RIGHT knee  . BBB (bundle  branch block)    hx of R BBB  . Cataract    bilateral sx  . Clotting disorder [facility])    has a genetic clotting dx -unknown name  . Hypertension    on meds  . PONV (postoperative nausea and vomiting)   . Sleep apnea    use CPAP   Past Surgical History:  Procedure Laterality Date  . CATARACT EXTRACTION, BILATERAL    . CHOLECYSTECTOMY    . COLONOSCOPY  2017   RG-mag citrtae (good)-TICS/TA x 1  . KNEE SURGERY Right   . LAPAROSCOPIC GASTRIC BANDING    . POLYPECTOMY  2017   TA x 1  . WISDOM TOOTH EXTRACTION    . WRIST RECONSTRUCTION Right    Social History:  reports that he has never smoked. He has never used smokeless tobacco. He reports previous alcohol use. He reports previous drug use.  Allergies  Allergen Reactions  . Nsaids Other (See Comments)    Patient had a Lap band placed is suppose to have NO NSAIDS due to the possibility of esophageal erosion leading to band "coming through"  . Tamiflu [Oseltamivir] Nausea Only and Other (See Comments)    Stomach upset   Family History  Problem Relation Age of Onset  . Colon cancer Mother 79  . Colon polyps Mother 52  . Thyroid cancer Father 57       parathyroid CA  . Colon polyps Sister   . Colon polyps Brother   . Colon polyps Sister   . Colon polyps Sister   . Colon polyps Sister   .  Esophageal cancer Neg Hx   . Stomach cancer Neg Hx   . Rectal cancer Neg Hx     Prior to Admission medications   Medication Sig Start Date End Date Taking? Authorizing Provider  aspirin EC 81 MG tablet Take 81 mg by mouth at bedtime. Swallow whole.   Yes [provider]  atorvastatin (LIPITOR) 20 MG tablet Take 20 mg by mouth at bedtime.   Yes [provider]  buPROPion (WELLBUTRIN) 75 MG tablet Take 75 mg by mouth at bedtime. 05/17/18  Yes [provider]  Cholecalciferol (VITAMIN D3) 125 MCG (5000 UT) CAPS Take 5,000 Units by mouth at bedtime.   Yes [provider]  ferrous sulfate 324 (65 Fe) MG TBEC  Take 65 mg by mouth daily after supper.   Yes [provider]  FLUoxetine (PROZAC) 40 MG capsule Take 40 mg by mouth at bedtime. 01/16/20  Yes [provider]  folic acid (FOLVITE) 161 MCG tablet Take 400 mcg by mouth at bedtime.   Yes [provider]  lisinopril (ZESTRIL) 10 MG tablet Take 10 mg by mouth at bedtime.   Yes [provider]  metoprolol succinate (TOPROL-XL) 25 MG 24 hr tablet Take 25 mg by mouth at bedtime. 09/08/20  Yes [provider]  ranolazine (RANEXA) 500 MG 12 hr tablet Take 500 mg by mouth at bedtime. 01/16/20  Yes [provider]  tamsulosin (FLOMAX) 0.4 MG CAPS capsule Take 0.4 mg by mouth at bedtime. 09/11/20  Yes [provider]  testosterone cypionate (DEPOTESTOTERONE CYPIONATE) 100 MG/ML injection Inject 100 mg into the muscle every Thursday. For IM use only   Yes [provider]  tolterodine (DETROL LA) 4 MG 24 hr capsule Take 4 mg by mouth at bedtime. 07/06/18  Yes [provider]   Physical Exam: Blood pressure (!) 149/61, pulse (!) 51, temperature 97.6 F (36.4 C), temperature source Temporal, resp. rate (!) 21, SpO2 92 %. Vitals:   11/21/20 1630 11/21/20 1700  BP: (!) 155/60 (!) 149/61  Pulse: (!) 48 (!) 51  Resp: 19 (!) 21  Temp:    SpO2: 92% 92%     General: No acute distress  Eyes: PERRL ER  ENT: Moist  Neck: Supple no JVD  Cardiovascular: RRR, no murmurs  Respiratory: Clear present bilaterally, no crackles or wheezing  Abdomen: Soft nontender nondistended  Skin: No rash  Musculoskeletal: Normal ROM  Psychiatric: Calm  Neurologic: No significant difference in muscle strength bilaterally.  Labs on Admission:  Basic Metabolic Panel: Recent Labs  Lab 11/20/20 2253 11/21/20 0216  NA 133*  --   K 4.5  --   CL 101  --   CO2 23  --   GLUCOSE 109*  --   BUN 17  --   CREATININE 1.39*  --   CALCIUM 8.4*  --   MG  --  1.5*  PHOS  --  4.1   Liver Function  Tests: Recent Labs  Lab 11/20/20 2253  AST 74*  ALT 37  ALKPHOS 50  BILITOT 1.0  PROT 7.7  ALBUMIN 2.8*   No results for input(s): LIPASE, AMYLASE in the last 168 hours. No results for input(s): AMMONIA in the last 168 hours. CBC: Recent Labs  Lab 11/20/20 2253  WBC 11.0*  NEUTROABS 5.0  HGB 11.1*  HCT 34.6*  MCV 103.3*  PLT 177   Cardiac Enzymes: No results for input(s): CKTOTAL, CKMB, CKMBINDEX, TROPONINI in the last 168 hours. BNP: Invalid  input(s): POCBNP CBG: No results for input(s): GLUCAP in the last 168 hours.  Radiological Exams on Admission: MR BRAIN WO CONTRAST  Result Date: 11/21/2020 CLINICAL DATA:  Stroke follow-up, right MCA occlusion post tPA EXAM: MRI HEAD WITHOUT CONTRAST TECHNIQUE: Multiplanar, multiecho pulse sequences of the brain and surrounding structures were obtained without intravenous contrast. COMPARISON:  MRI 05/20/2017, head CT 11/20/2020 FINDINGS: Motion artifact is present. Brain: Small focus of right superior temporal cortical diffusion hyperintensity. There is no evidence of intracranial hemorrhage. Ventricles and sulci are within normal limits in size and configuration. There is no intracranial mass or mass effect. There is no hydrocephalus or extra-axial fluid collection. Vascular: Major vessel flow voids at the skull base are preserved. Skull and upper cervical spine: Normal marrow signal is preserved. Sinuses/Orbits: Paranasal sinus mucosal thickening. Bilateral lens replacements. Other: Sella is unremarkable. Pituitary microadenoma on prior study is not well evaluated on this examination. Patchy mastoid fluid opacification. IMPRESSION: Small cortical acute to subacute infarct of the right temporal lobe. No hemorrhage. Electronically Signed   By: Macy Mis M.D.   On: 11/21/2020 08:35   CT CEREBRAL PERFUSION W CONTRAST  Result Date: 11/20/2020 CLINICAL DATA:  Follow-up examination for acute stroke, right MCA occlusion on prior CTA. EXAM:  CT PERFUSION BRAIN TECHNIQUE: Multiphase CT imaging of the brain was performed following IV bolus contrast injection. Subsequent parametric perfusion maps were calculated using RAPID software. CONTRAST:  6mL OMNIPAQUE IOHEXOL 350 MG/ML SOLN COMPARISON:  Comparison made with prior CT and CTA from earlier the same day. FINDINGS: CT Brain Perfusion Findings: CBF (<30%) Volume: 70mL Perfusion (Tmax>6.0s) volume: 27mL Mismatch Volume: 15mL ASPECTS on noncontrast CT Head: 10 at 7:08 p.m. today. Infarct Core: 0 mL Infarction Location:Apparent 50 cc perfusion deficit seen involving the mid and posterior right MCA distribution, involving the posterior right frontoparietal region. Finding is in keeping with the previously identified right MCA occlusion. No evidence for acute core infarct by CT perfusion. IMPRESSION: 50 cc perfusion deficit involving the mid and posterior right MCA distribution, in keeping with the previously identified right MCA occlusion. No evidence for acute core infarct by CT perfusion. Electronically Signed   By: Jeannine Boga M.D.   On: 11/20/2020 23:13   DG CHEST PORT 1 VIEW  Result Date: 11/21/2020 CLINICAL DATA:  Hypoxia EXAM: PORTABLE CHEST 1 VIEW COMPARISON:  11/20/2020 FINDINGS: Cardiac shadow is enlarged but stable. Lungs are well aerated bilaterally. No focal infiltrate is seen. Mild vascular congestion is noted without interstitial edema. No bony abnormality is seen. IMPRESSION: Mild increase in vascular congestion without interstitial edema. Electronically Signed   By: Inez Catalina M.D.   On: 11/21/2020 15:41    EKG: Independently reviewed.  Sinus bradycardia with PVCs  Time spent: [facility] Pager [phone] 11/21/2020, 7:10 PM

## 2020-11-21 NOTE — ED Notes (Addendum)
This RN just updated pt's spouse; Rebecca Motta daughter and spouse both called 3 times while this RN was in pot's room giving pt care. Spouse sounds very upset, states no one has contacted her all night long and she does not understand why he is being admitted and what is going on with him. This RN clarified with spouse that pt was given TPA at [facility], spouse states, "Yes, I'm aware, I gave consent for that, they suspected he was having a stroke."  Spouse also upset that I contacted her via house phone, she is requesting her first line of contact info be updated to her cell phone, [phone]  I also requested for one line of contact. Spouse will be primary and she agrees to update her daughter and other family members.

## 2020-11-21 NOTE — ED Notes (Signed)
Patient transported to MRI 

## 2020-11-21 NOTE — ED Notes (Addendum)
Spouse brought pt another pair of pants, shoes, shirt, ipad, cell phone, two chargers and a pair of eye glasses. She also took pt's pocket knife home with her.  Pt's cell phone plugged into bed and ipad placed on bedside table

## 2020-11-21 NOTE — Plan of Care (Signed)
Wife and daughter updated on patient status and plan of care by phone.

## 2020-11-22 ENCOUNTER — Other Ambulatory Visit: Payer: Self-pay

## 2020-11-22 ENCOUNTER — Inpatient Hospital Stay (HOSPITAL_COMMUNITY): Payer: PPO

## 2020-11-22 DIAGNOSIS — I6389 Other cerebral infarction: Secondary | ICD-10-CM

## 2020-11-22 LAB — HEMOGLOBIN AND HEMATOCRIT, BLOOD
HCT: 29.4 % — ABNORMAL LOW (ref 39.0–52.0)
Hemoglobin: 9.4 g/dL — ABNORMAL LOW (ref 13.0–17.0)

## 2020-11-22 LAB — ABO/RH: ABO/RH(D): O NEG

## 2020-11-22 LAB — DIC (DISSEMINATED INTRAVASCULAR COAGULATION)PANEL
D-Dimer, Quant: >20 ug/mL-FEU — ABNORMAL HIGH (ref 0.00–0.50)
Fibrinogen: 65 mg/dL — CL (ref 210–475)
INR: 1.4 — ABNORMAL HIGH (ref 0.8–1.2)
Platelets: 159 10*3/uL (ref 150–400)
Prothrombin Time: 16.5 seconds — ABNORMAL HIGH (ref 11.4–15.2)
Smear Review: NONE SEEN
aPTT: 25 seconds (ref 24–36)

## 2020-11-22 LAB — TYPE AND SCREEN
ABO/RH(D): O NEG
Antibody Screen: NEGATIVE

## 2020-11-22 LAB — C-REACTIVE PROTEIN: CRP: 0.7 mg/dL (ref <1.0)

## 2020-11-22 LAB — FERRITIN: Ferritin: 319 ng/mL (ref 24–336)

## 2020-11-22 LAB — ECHOCARDIOGRAM LIMITED
Area-P 1/2: 2.37 cm2
Height: 73 in
S' Lateral: 3.7 cm
Weight: 4733.72 oz

## 2020-11-22 LAB — D-DIMER, QUANTITATIVE: D-Dimer, Quant: >20 ug/mL-FEU — ABNORMAL HIGH (ref 0.00–0.50)

## 2020-11-22 LAB — FIBRINOGEN: Fibrinogen: <60 mg/dL — CL (ref 210–475)

## 2020-11-22 LAB — TROPONIN I (HIGH SENSITIVITY)
Troponin I (High Sensitivity): 50 ng/L — ABNORMAL HIGH (ref <18)
Troponin I (High Sensitivity): 54 ng/L — ABNORMAL HIGH (ref <18)

## 2020-11-22 LAB — GLUCOSE, CAPILLARY: Glucose-Capillary: 82 mg/dL (ref 70–99)

## 2020-11-22 LAB — MRSA PCR SCREENING: MRSA by PCR: NEGATIVE

## 2020-11-22 LAB — PHOSPHORUS: Phosphorus: 3.1 mg/dL (ref 2.5–4.6)

## 2020-11-22 LAB — PROCALCITONIN: Procalcitonin: <0.1 ng/mL

## 2020-11-22 LAB — HEPATITIS B SURFACE ANTIGEN: Hepatitis B Surface Ag: NONREACTIVE

## 2020-11-22 LAB — MAGNESIUM: Magnesium: 1.8 mg/dL (ref 1.7–2.4)

## 2020-11-22 MED ORDER — CHLORHEXIDINE GLUCONATE CLOTH 2 % EX PADS: 6.0000 | MEDICATED_PAD | Freq: Every day | CUTANEOUS | Status: DC

## 2020-11-22 MED ORDER — GUAIFENESIN ER 600 MG PO TB12: 600.0000 mg | ORAL_TABLET | Freq: Two times a day (BID) | ORAL | 0 refills | Status: DC

## 2020-11-22 MED ORDER — BENZONATATE 100 MG PO CAPS: 100.0000 mg | ORAL_CAPSULE | Freq: Three times a day (TID) | ORAL | 0 refills | Status: AC | PRN

## 2020-11-22 MED ORDER — APIXABAN 5 MG PO TABS
5.0000 mg | ORAL_TABLET | Freq: Two times a day (BID) | ORAL | Status: DC
  Administered 2020-11-22: 5 mg via ORAL
  Filled 2020-11-22: qty 1

## 2020-11-22 MED ORDER — APIXABAN 5 MG PO TABS: 5.0000 mg | ORAL_TABLET | Freq: Two times a day (BID) | ORAL | 0 refills | Status: DC

## 2020-11-22 NOTE — Progress Notes (Signed)
STROKE TEAM PROGRESS NOTE   INTERVAL HISTORY Patient is sitting up in bed.  He states is doing fine.  He has no complaints.  Vital signs are stable.  He is undergoing 2D echo. 24-hour CT scan yesterday showed no evidence of post tPA hemorrhage. Vitals:   11/22/20 0900 11/22/20 1000 11/22/20 1100 11/22/20 1200  BP: 132/75 (!) 131/58 129/61 (!) 146/68  Pulse: (!) 50 (!) 55 (!) 48 (!) 53  Resp: 19 (!) 23 (!) 22 17  Temp:    98.7 F (37.1 C)  TempSrc:    Oral  SpO2: 100% 100% 96% 96%  Weight:      Height:       CBC:  Recent Labs  Lab 11/20/20 2253 11/22/20 0744  WBC 11.0*  --   NEUTROABS 5.0  --   HGB 11.1* 9.4*  HCT 34.6* 29.4*  MCV 103.3*  --   PLT 177 [id number]   Basic Metabolic Panel:  Recent Labs  Lab 11/20/20 2253 11/21/20 0216 11/22/20 0313  NA 133*  --   --   K 4.5  --   --   CL 101  --   --   CO2 23  --   --   GLUCOSE 109*  --   --   BUN 17  --   --   CREATININE 1.39*  --   --   CALCIUM 8.4*  --   --   MG  --  1.5* 1.8  PHOS  --  4.1 3.1   Lipid Panel:  Recent Labs  Lab 11/21/20 0216  CHOL 113  TRIG 96  HDL 22*  CHOLHDL 5.1  VLDL 19  LDLCALC 72   HgbA1c:  Recent Labs  Lab 11/21/20 0216  HGBA1C 5.8*   Urine Drug Screen:  Recent Labs  Lab 11/20/20 2253  LABOPIA NONE DETECTED  COCAINSCRNUR NONE DETECTED  LABBENZ NONE DETECTED  AMPHETMU NONE DETECTED  THCU NONE DETECTED  LABBARB NONE DETECTED     0 Result Notes   Ref Range & Units 02:16  Troponin I (High Sensitivity) <18 ng/L 39High       11/20/20 23:51 Prothrombin Time 11.4 - 15.2 seconds 48.2High   INR 0.8 - 1.2 5.5High Panic   Comment: REPEATED TO VERIFY    aPTT 24 - 36 seconds 68High   Alcohol Level  Recent Labs  Lab 11/20/20 2253  ETH <10    IMAGING past 24 hours CT HEAD WO CONTRAST  Result Date: 11/21/2020 CLINICAL DATA:  Follow-up stroke status post tPA. EXAM: CT HEAD WITHOUT CONTRAST TECHNIQUE: Contiguous axial images were obtained from the base of the skull  through the vertex without intravenous contrast. COMPARISON:  Prior MRI from earlier same day as well as previous CTs from 11/20/2020. FINDINGS: Brain: Previously identified subcentimeter cortical infarcts involving the right cerebral hemisphere are not well visualized by CT. No visible acute large vessel territory infarct. No acute intracranial hemorrhage status post tPA administration. No mass lesion, midline shift or mass effect. No hydrocephalus or extra-axial fluid collection. Vascular: No hyperdense vessel. Scattered vascular calcifications noted within the carotid siphons. Skull: Scalp soft tissues and calvarium within normal limits. Sinuses/Orbits: Globes and orbital soft tissues demonstrate no acute finding. Scattered mucosal thickening noted within the ethmoidal air cells and maxillary sinuses. Small right mastoid effusion again noted. Other: None. IMPRESSION: 1. Previously identified subcentimeter cortical infarcts involving the right cerebral hemisphere are not well visualized by CT. No acute intracranial hemorrhage status post tPA administration. 2.  No other new acute intracranial abnormality. Electronically Signed   By: Jeannine Boga M.D.   On: 11/21/2020 20:33   DG CHEST PORT 1 VIEW  Result Date: 11/21/2020 CLINICAL DATA:  Hypoxia EXAM: PORTABLE CHEST 1 VIEW COMPARISON:  11/20/2020 FINDINGS: Cardiac shadow is enlarged but stable. Lungs are well aerated bilaterally. No focal infiltrate is seen. Mild vascular congestion is noted without interstitial edema. No bony abnormality is seen. IMPRESSION: Mild increase in vascular congestion without interstitial edema. Electronically Signed   By: Inez Catalina M.D.   On: 11/21/2020 15:41    PHYSICAL EXAM: Obese middle-aged Caucasian male not in distress.  He is on oxygen. . Afebrile. Head is nontraumatic. Neck is supple without bruit.    Cardiac exam no murmur or gallop. Lungs are clear to auscultation. Distal pulses are well felt. Neurological  Exam ;  Awake  Alert oriented x 3. Normal speech and language.eye movements full without nystagmus.fundi were not visualized. Vision acuity and fields appear normal. Hearing is normal. Palatal movements are normal. Face symmetric. Tongue midline. Normal strength, tone, reflexes and coordination. Normal sensation. Gait deferred.   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  Acute embolic stroke and occlusion in right MCA distal M1/proximal M2 likely due to atrial fibrillation (one witnessed event 2020), possible hypercoagulable disease s/p tPA at [facility] 11/20/20   Code Stroke CT head No acute abnormality at OSH.   CTA head & neck showed head and neck showed occlusion in right MCA distal M1/proximal M2  CT perfusion: 50 cc perfusion deficit involving the mid and posterior right MCAdistribution, in keeping with the previously identified right MCA  occlusion. No evidence for acute core infarct by CT perfusion.  MRI: Small cortical acute to subacute infarct of the right temporal lobe. No hemorrhage.  Carotid Doppler    2D Echo PENDING  LDL 72  HgbA1c 5.8  VTE prophylaxis is recommended.     Diet   Diet Heart Room service appropriate? Yes; Fluid consistency: Thin   Post tPA blood pressure: <180/119mm Hg for first 24 hours the gradual reduction over next few days.  Therapy recommendations:  TBD  Disposition:  TBD  On ASA 81 mg prior to admission    Mildly elevated troponin, Bradycardia and history of paroxysmal atrial fibrillation episode  Follow troponin   EKG repeated  Per chart review EF Mar 2021 60-65%w has hx of  baseline  sinus bradycardia with heart rates in 50s and bifascicular block. 2020 underwent a ZIO monitoring. Zio patch showed PVCs, one episode of NSVT. Underwent coronary angiography in August 2020 (Dr.[facility]) when he was noted to have distal LAD disease. The involved vessel was less than 1 mm and was determined to be the unlikely etiology for ischemia that was not amenable for revascularization. Otherwise nonobstructive coronary disease. Episode of Paroxysmal Atrial Fibrillation-converted to NSR during the procedure.   On Toprol XL prior to admission  Hypertension  Home meds: Toprol XL 25 mg daily, Lisinopril 10mg  daily,   Stable . Post tPA goal 180/105 then permissive hypertension (OK if < 220/120) but gradually normalize in 5-7 days . Long-term BP goal normotensive  Hyperlipidemia  Home meds: Lipitor 20mg   resumed in hospital  LDL 72, goal < 70  Continue statin at discharge  Possible clotting disorder  Family history of clotting disorder with son dying from PE and daughter surviving PE. Wife thinks its Factor V Leiden issue but states patient never tested. Daughter Juanna Cao, did not answer phone [phone]).   Other Stroke Risk Factors  Advanced Age >/= 67   Cigarette smoker -was advised to stop smoking  ETOH use, alcohol level <10, advised to drink no more than 1-2 drink(s) a day  Substance abuse - UDS:  THC NONE DETECTED, Cocaine NONE DETECTED. Patient advised to stop using due to stroke risk.  Obesity, Body mass index is 39.03 kg/m., BMI >/= 30 associated with increased stroke risk, recommend weight loss, diet and exercise as appropriate   Hx stroke/TIA  Family hx stroke   Coronary artery disease  Other Active Problems  COVID positive    NRB mask with 100% saturation    Hospital day # 1   He presented with sudden onset of left hemiparesis secondary to embolic right temporal MCA branch infarct given prior history of paroxysmal A. fib likely embolic.  He  is also tested positive for COVID-19 infection hypercoagulability.  Continue close neurological monitoring and strict blood pressure control as per post tPA protocol.  Mobilize out of bed.  Therapy consults.  Start   Eliquis for long-term anticoagulation.  Discussed with Dr. Avon Gully medical hospitalist who feels his COVID is asymptomatic and does not require COVID-specific treatment at the present time.  Wean and discontinue Cleviprex drip.  Likely discharge home soon.  Greater than 50% time during this 35-minute visit was spent on counseling and coordination of care and treatment and answering questions      Antony Contras, MD Medical Director [facility] Pager: [phone] 11/22/2020 1:39 PM   To contact Stroke Continuity provider, please refer to [email or web address] After hours, contact General Neurology

## 2020-11-22 NOTE — Discharge Summary (Addendum)
Stroke Discharge Summary  Patient ID: Lance Pham   MRN: [id number]      DOB: 06/27/1951  Date of Admission: 11/20/2020 Date of Discharge: 11/22/2020  Attending Physician:  Gwinda Maine, MD, Stroke MD Discharge Physician: Dr. Antony Contras  Consultant(s):   Treatment Team:  Lequita Halt, MD  Patient's PCP:  Bonnita Nasuti, MD  DISCHARGE DIAGNOSIS:  Active Problems:   Stroke (cerebrum) ([place])  1. 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 Hypertension, controlled  3. Hyperlipidemia, uncontrolled  4. Family history of clotting disorder   5. COVID     LABORATORY STUDIES CBC    Component Value Date/Time   WBC 11.0 (H) 11/20/2020 2253   RBC 3.35 (L) 11/20/2020 2253   HGB 9.4 (L) 11/22/2020 0744   HCT 29.4 (L) 11/22/2020 0744   PLT 159 11/22/2020 0744   MCV 103.3 (H) 11/20/2020 2253   MCH 33.1 11/20/2020 2253   MCHC 32.1 11/20/2020 2253   RDW 15.0 11/20/2020 2253   LYMPHSABS 4.8 (H) 11/20/2020 2253   MONOABS 0.7 11/20/2020 2253   EOSABS 0.1 11/20/2020 2253   BASOSABS 0.1 11/20/2020 2253   CMP    Component Value Date/Time   NA 133 (L) 11/20/2020 2253   K 4.5 11/20/2020 2253   CL 101 11/20/2020 2253   CO2 23 11/20/2020 2253   GLUCOSE 109 (H) 11/20/2020 2253   BUN 17 11/20/2020 2253   CREATININE 1.39 (H) 11/20/2020 2253   CALCIUM 8.4 (L) 11/20/2020 2253   PROT 7.7 11/20/2020 2253   ALBUMIN 2.8 (L) 11/20/2020 2253   AST 74 (H) 11/20/2020 2253   ALT 37 11/20/2020 2253   ALKPHOS 50 11/20/2020 2253   BILITOT 1.0 11/20/2020 2253   GFRNONAA 55 (L) 11/20/2020 2253   COAGS Lab Results  Component Value Date   INR 1.4 (H) 11/22/2020   INR 5.5 (HH) 11/20/2020   Lipid Panel    Component Value Date/Time   CHOL 113 11/21/2020 0216   TRIG 96 11/21/2020 0216   HDL 22 (L) 11/21/2020 0216    CHOLHDL 5.1 11/21/2020 0216   VLDL 19 11/21/2020 0216   LDLCALC 72 11/21/2020 0216   HgbA1C  Lab Results  Component Value Date   HGBA1C 5.8 (H) 11/21/2020   Urinalysis    Component Value Date/Time   COLORURINE YELLOW 11/20/2020 2253   APPEARANCEUR HAZY (A) 11/20/2020 2253   LABSPEC 1.040 (H) 11/20/2020 2253   PHURINE 5.0 11/20/2020 2253   GLUCOSEU NEGATIVE 11/20/2020 2253   HGBUR SMALL (A) 11/20/2020 2253   BILIRUBINUR NEGATIVE 11/20/2020 2253   KETONESUR NEGATIVE 11/20/2020 2253   PROTEINUR 100 (A) 11/20/2020 2253   NITRITE NEGATIVE 11/20/2020 2253   LEUKOCYTESUR NEGATIVE 11/20/2020 2253   Urine Drug Screen     Component Value Date/Time   LABOPIA NONE DETECTED 11/20/2020 2253   COCAINSCRNUR NONE DETECTED 11/20/2020 2253   LABBENZ NONE DETECTED 11/20/2020 2253   AMPHETMU NONE DETECTED 11/20/2020 2253   THCU NONE DETECTED 11/20/2020 2253   LABBARB NONE DETECTED 11/20/2020 2253    Alcohol Level    Component Value Date/Time   ETH <10 11/20/2020 2253   COVID-19 positive at [facility] on 11/20/20  COVID-19 Labs  Recent Labs    11/21/20 2130 11/22/20 0313 11/22/20 0744  DDIMER  --  >20.00* >20.00*  FERRITIN  312 319  --   LDH 279*  --   --   CRP 0.5 0.7  --     No results found for: SARSCOV2NAA  SIGNIFICANT DIAGNOSTIC STUDIES CT HEAD WO CONTRAST  Result Date: 11/21/2020 CLINICAL DATA:  Follow-up stroke status post tPA. EXAM: CT HEAD WITHOUT CONTRAST TECHNIQUE: Contiguous axial images were obtained from the base of the skull through the vertex without intravenous contrast. COMPARISON:  Prior MRI from earlier same day as well as previous CTs from 11/20/2020. FINDINGS: Brain: Previously identified subcentimeter cortical infarcts involving the right cerebral hemisphere are not well visualized by CT. No visible acute large vessel territory infarct. No acute intracranial hemorrhage status post tPA administration. No mass lesion, midline shift or mass effect. No  hydrocephalus or extra-axial fluid collection. Vascular: No hyperdense vessel. Scattered vascular calcifications noted within the carotid siphons. Skull: Scalp soft tissues and calvarium within normal limits. Sinuses/Orbits: Globes and orbital soft tissues demonstrate no acute finding. Scattered mucosal thickening noted within the ethmoidal air cells and maxillary sinuses. Small right mastoid effusion again noted. Other: None. IMPRESSION: 1. Previously identified subcentimeter cortical infarcts involving the right cerebral hemisphere are not well visualized by CT. No acute intracranial hemorrhage status post tPA administration. 2. No other new acute intracranial abnormality. Electronically Signed   By: Jeannine Boga M.D.   On: 11/21/2020 20:33   MR BRAIN WO CONTRAST  Result Date: 11/21/2020 CLINICAL DATA:  Stroke follow-up, right MCA occlusion post tPA EXAM: MRI HEAD WITHOUT CONTRAST TECHNIQUE: Multiplanar, multiecho pulse sequences of the brain and surrounding structures were obtained without intravenous contrast. COMPARISON:  MRI 05/20/2017, head CT 11/20/2020 FINDINGS: Motion artifact is present. Brain: Small focus of right superior temporal cortical diffusion hyperintensity. There is no evidence of intracranial hemorrhage. Ventricles and sulci are within normal limits in size and configuration. There is no intracranial mass or mass effect. There is no hydrocephalus or extra-axial fluid collection. Vascular: Major vessel flow voids at the skull base are preserved. Skull and upper cervical spine: Normal marrow signal is preserved. Sinuses/Orbits: Paranasal sinus mucosal thickening. Bilateral lens replacements. Other: Sella is unremarkable. Pituitary microadenoma on prior study is not well evaluated on this examination. Patchy mastoid fluid opacification. IMPRESSION: Small cortical acute to subacute infarct of the right temporal lobe. No hemorrhage. Electronically Signed   By: Macy Mis M.D.   On:  11/21/2020 08:35   CT CEREBRAL PERFUSION W CONTRAST  Result Date: 11/20/2020 CLINICAL DATA:  Follow-up examination for acute stroke, right MCA occlusion on prior CTA. EXAM: CT PERFUSION BRAIN TECHNIQUE: Multiphase CT imaging of the brain was performed following IV bolus contrast injection. Subsequent parametric perfusion maps were calculated using RAPID software. CONTRAST:  42mL OMNIPAQUE IOHEXOL 350 MG/ML SOLN COMPARISON:  Comparison made with prior CT and CTA from earlier the same day. FINDINGS: CT Brain Perfusion Findings: CBF (<30%) Volume: 92mL Perfusion (Tmax>6.0s) volume: 4mL Mismatch Volume: 45mL ASPECTS on noncontrast CT Head: 10 at 7:08 p.m. today. Infarct Core: 0 mL Infarction Location:Apparent 50 cc perfusion deficit seen involving the mid and posterior right MCA distribution, involving the posterior right frontoparietal region. Finding is in keeping with the previously identified right MCA occlusion. No evidence for acute core infarct by CT perfusion. IMPRESSION: 50 cc perfusion deficit involving the mid and posterior right MCA distribution, in keeping with the previously identified right MCA occlusion. No evidence for acute core infarct by CT perfusion. Electronically Signed   By: Jeannine Boga M.D.   On: 11/20/2020 23:13  DG CHEST PORT 1 VIEW  Result Date: 11/21/2020 CLINICAL DATA:  Hypoxia EXAM: PORTABLE CHEST 1 VIEW COMPARISON:  11/20/2020 FINDINGS: Cardiac shadow is enlarged but stable. Lungs are well aerated bilaterally. No focal infiltrate is seen. Mild vascular congestion is noted without interstitial edema. No bony abnormality is seen. IMPRESSION: Mild increase in vascular congestion without interstitial edema. Electronically Signed   By: Inez Catalina M.D.   On: 11/21/2020 15:41   ECHOCARDIOGRAM LIMITED  Result Date: 11/22/2020    ECHOCARDIOGRAM LIMITED REPORT   Patient Name:   Lance Pham Date of Exam: 11/22/2020 Medical Rec #:  [id number]       Height:       73.0 in  Accession #:    [id number]      Weight:       295.9 lb Date of Birth:  26-Aug-1951        BSA:          2.541 m Patient Age:    44 years        BP:           146/68 mmHg Patient Gender: M               HR:           55 bpm. Exam Location:  Inpatient Procedure: Cardiac Doppler, Limited Echo and Limited Color Doppler Indications:    Stroke 434.91 / I163.9  History:        Patient has no prior history of Echocardiogram examinations.                 Arrythmias:Atrial Fibrillation and RBBB; Risk Factors:Sleep                 Apnea and Hypertension.  Sonographer:    Jonelle Sidle Dance Referring Phys: [id number] [place]  1. Left ventricular ejection fraction, by estimation, is 55 to 60%. The left ventricle has normal function. The left ventricle has no regional wall motion abnormalities. There is mild concentric left ventricular hypertrophy. Left ventricular diastolic parameters are consistent with Grade II diastolic dysfunction (pseudonormalization).  2. Right ventricular systolic function is normal. The right ventricular size is normal. Tricuspid regurgitation signal is inadequate for assessing PA pressure.  3. Left atrial size was moderately dilated.  4. Right atrial size was moderately dilated.  5. The mitral valve is normal in structure. Trivial mitral valve regurgitation.  6. The aortic valve is tricuspid. Aortic valve regurgitation is not visualized. No aortic stenosis is present.  7. Aortic dilatation noted. There is borderline dilatation of the ascending aorta, measuring 38 mm.  8. The inferior vena cava is normal in size with greater than 50% respiratory variability, suggesting right atrial pressure of 3 mmHg. Comparison(s): No prior Echocardiogram. Conclusion(s)/Recommendation(s): No intracardiac source of embolism detected on this transthoracic study. A transesophageal echocardiogram is recommended to exclude cardiac source of embolism if clinically indicated. Frequent PVCs seen during study.  FINDINGS  Left Ventricle: Left ventricular ejection fraction, by estimation, is 55 to 60%. The left ventricle has normal function. The left ventricle has no regional wall motion abnormalities. There is mild concentric left ventricular hypertrophy. Left ventricular diastolic parameters are consistent with Grade II diastolic dysfunction (pseudonormalization). Right Ventricle: The right ventricular size is normal. Right vetricular wall thickness was not well visualized. Right ventricular systolic function is normal. Tricuspid regurgitation signal is inadequate for assessing PA pressure. Left Atrium: Left atrial size was moderately dilated. Right Atrium: Right atrial size was moderately  dilated. Mitral Valve: The mitral valve is normal in structure. Trivial mitral valve regurgitation. Tricuspid Valve: The tricuspid valve is normal in structure. Tricuspid valve regurgitation is not demonstrated. No evidence of tricuspid stenosis. Aortic Valve: The aortic valve is tricuspid. Aortic valve regurgitation is not visualized. No aortic stenosis is present. Pulmonic Valve: The pulmonic valve was grossly normal. Pulmonic valve regurgitation is trivial. No evidence of pulmonic stenosis. Aorta: Aortic dilatation noted. There is borderline dilatation of the ascending aorta, measuring 38 mm. Venous: The inferior vena cava is normal in size with greater than 50% respiratory variability, suggesting right atrial pressure of 3 mmHg. IAS/Shunts: The atrial septum is grossly normal. LEFT VENTRICLE PLAX 2D LVIDd:         5.40 cm  Diastology LVIDs:         3.70 cm  LV e' medial:    5.00 cm/s LV PW:         1.30 cm  LV E/e' medial:  20.0 LV IVS:        1.30 cm  LV e' lateral:   11.00 cm/s LVOT diam:     2.40 cm  LV E/e' lateral: 9.1 LV SV:         143 LV SV Index:   56 LVOT Area:     4.52 cm  RIGHT VENTRICLE          IVC RV Basal diam:  4.10 cm  IVC diam: 1.80 cm LEFT ATRIUM              Index       RIGHT ATRIUM           Index LA diam:         5.50 cm  2.16 cm/m  RA Area:     25.20 cm LA Vol (A2C):   134.0 ml 52.73 ml/m RA Volume:   77.30 ml  30.42 ml/m LA Vol (A4C):   74.5 ml  29.32 ml/m LA Biplane Vol: 101.0 ml 39.74 ml/m  AORTIC VALVE LVOT Vmax:   135.50 cm/s LVOT Vmean:  88.000 cm/s LVOT VTI:    0.316 m  AORTA Ao Root diam: 3.70 cm Ao Asc diam:  3.80 cm MITRAL VALVE MV Area (PHT): 2.37 cm    SHUNTS MV Decel Time: 320 msec    Systemic VTI:  0.32 m MV E velocity: 99.80 cm/s  Systemic Diam: 2.40 cm MV A velocity: 77.70 cm/s MV E/A ratio:  1.28 Buford Dresser MD Electronically signed by Buford Dresser MD Signature Date/Time: 11/22/2020/3:04:24 PM    Final     HISTORY OF PRESENT ILLNESS For full HPI please see Dr. Harrietta Guardian admission H&P. 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 CT head negative and given tPA and taken for CTA.  He was COVID positive there. He was transferred to [facility] ED for further neurologic evaluation, work up and care. UDS and alcohol screens were negative.   [place] was evaluated in the ED upon arrival. His symptoms resolved post tPA. He was placed on a Cardene drip for uncontrolled  HTN. His stroke work up included CTA of the  head and neck which showed occlusion in right MCA distal M1/proximal M2. CTP showed Apparent 50 cc perfusion deficit seen involving mid and posterior right MCA distribution, involving the posterior right frontoparietal region. NIHSS 0 upon arrival with good collateral circulation and the decision was  made to hold off on intervention and monitor the patient. He remained neurologically and hemodynamically stable with resolution of his deficits. Further work up included MRI which showed small cortical acute to subacute infarct of the right temporal lobe. 2D Echo showed EF 55-60%. EKG showed borderline bradycardia (known history). Factor V Leiden was sent and is pending (see below).  Mild elevation of troponin was noted. LDL was noted to be 72. HgbA1C was 5.8. He was admitted to the ICU and monitored there. He remained stable. He was started on Eliquis 1/20. He was discharged home in the care of his wife.   #Possible clotting disorder History of son dying of PE despite filter placement and daughter on [facility] for  "heterozygous thrombophilia" Never tested per patient and wife Factor V Leiden sent and pending   #COVID 41 Dr. Primus Bravo, hospital medicine, was consulted. Patient admitted during the end of his illness and currently with mild symptoms. CXR was without acute concerns Covid labs were obtained.  Mucinex and tessalon pearls prn were used for symptom management Follow up with PCP in 2 weeks   DISCHARGE EXAM Blood pressure (!) 124/57, pulse 62, temperature 98.3 F (36.8 C), temperature source Oral, resp. rate 20, height 6\' 1"  (1.854 m), weight 134.2 kg, SpO2 96 %.  Obese male sitting up in bed in NAD.  Pleasant and conversant.  Resp even and unlabored. No extra work of breathing.  Neurological Exam ;  Awake  Alert oriented x 4. Normal speech and language EOMI intact  Vision acuity and fields appear normal.  Hearing is normal.  Palatal movements are normal.  Face symmetric.  Tongue midline. Normal strengthand coordination.  Normal sensation in face, arms and legs.  Gait deferred.   Discharge Diet       Diet   Diet Heart Room service appropriate? Yes; Fluid consistency: Thin   liquids  DISCHARGE PLAN Disposition:  Home  Eliquis 5mg  BID  Ongoing stroke risk factor control by  Primary Care Physician at time of discharge Follow-up PCP [place], Rosalyn Charters, MD in 2 weeks. Follow-up in [facility] in 4 weeks, office to schedule an appointment. Amb referral sent.   40 minutes were spent preparing discharge. I have personally obtained history,examined this patient, reviewed notes, independently viewed imaging studies, participated in medical decision making and plan of care.ROS completed by me personally and pertinent positives fully documented  I have made any additions or clarifications directly to the above note. Agree with note above.   Antony Contras, MD Medical Director [facility] Pager: [phone] 11/23/2020 8:14 AM

## 2020-11-22 NOTE — Plan of Care (Signed)
  Problem: Education: Goal: Knowledge of patient specific risk factors addressed and post discharge goals established will improve Outcome: Progressing   

## 2020-11-22 NOTE — Progress Notes (Signed)
Transitions of Care Pharmacist Note  Ryheem Jay Defino is a 70 y.o. male that has been diagnosed with A Fib and will be prescribed Eliquis (apixaban) at discharge.   Patient Education: I provided the following education on 11/22/20 to the patient's wife: How to take the medication Described what the medication is Signs of bleeding Answered their questions   Thank you,   Dimple Nanas, PharmD PGY-1 Acute Care Pharmacy Resident 11/22/2020 5:36 PM

## 2020-11-22 NOTE — Progress Notes (Signed)
SLP Cancellation Note  Patient Details Name: Lance Pham MRN: [id number] DOB: [birth year]   Cancelled treatment:       Reason Eval/Treat Not Completed: Patient at procedure or test/unavailable (Pt having echo completed at this time. SLP will f/u)  Tobie Poet I. Hardin Negus, [place], [place] Office number [phone] Pager [place] 11/22/2020, 12:21 PM

## 2020-11-22 NOTE — Progress Notes (Addendum)
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 He is also a Theme park manager. Pt does not believe he had a stroke and believes he has been misdiagnosed. Pt is at his baseline regarding ADL and mobility, however demonstrates difficulty with awareness of deficits and short term memory. At this time recommend direct S with all medication and financial management in addition to refraining from driving until cleared by his MD. Attempted to call and discuss with his wife - no answer. If pt has difficulty with work or IADL tasks after DC, he was educated on importance of following up with his MD for a referral to OT at the neuro outpt center. No further acute OT needs.  VSS during session on RA. Nsg notified of swelling around IV site R arm.    11/22/20 1400  OT Visit Information  Last OT Received On 11/22/20  Assistance Needed +1  History of Present Illness 70 yo admitted to [facility] 1/18 with left weakness and slurred speech with Rt temporal lobe CVA s/p tPA then transferred to [facility]. Pt also Covid +. PMhx: CAD s/p CABG, Afib, HTN, HLD, clotting disorder  Precautions  Precautions None  Home Living  Family/patient expects to be discharged to: Private residence  Living Arrangements Spouse/significant other  Available Help at Discharge Family;Available 24 hours/day  Type of Home House  Home Access Stairs to enter  Entrance Stairs-Number of Steps 2  Entrance Stairs-Rails Right  Home Layout Multi-level;Able to live on main level with bedroom/bathroom  Print production planner Handicapped height  Bathroom Accessibility Yes  How Accessible Accessible via walker  [place] None  Prior Function  Level of Independence Independent  Communication  Communication No difficulties  Pain Assessment  Pain Assessment No/denies pain  Cognition  Arousal/Alertness  Awake/alert  Behavior During Therapy Flat affect  Overall Cognitive Status No family/caregiver present to determine baseline cognitive functioning  Area of Impairment Attention;Memory;Safety/judgement;Awareness  Current Attention Level Selective  Memory Decreased short-term memory  Safety/Judgement Decreased awareness of deficits  Awareness Anticipatory  General Comments Pt continues to think he was misdiagnosed adn does not believe that he had a stroke. Difficulty with delayed recall after 1 min adn unablet o recall 3/5 pieces of information. Information repeated and pt continued to have difficulty with recall. When stating months of the year in reverse order, pt skipped one month. Able to subtract from 100 by 3's without error. Pt unaware that he had tPa and states while at [facility] "they only started an IV"  Upper Extremity Assessment  Upper Extremity Assessment Overall WFL for tasks assessed  Lower Extremity Assessment  Lower Extremity Assessment Defer to PT evaluation  Cervical / Trunk Assessment  Cervical / Trunk Assessment Normal  ADL  Overall ADL's  At baseline  Vision- History  Baseline Vision/History Wears glasses  Wears Glasses At all times  Patient Visual Report No change from baseline  Vision- Assessment  Vision Assessment? Yes  Eye Alignment WFL  Ocular Range of Motion [facility]  Alignment/Gaze Preference WDL  Tracking/Visual Pursuits Able to track stimulus in all quads without difficulty  Saccades WFL  Convergence WFL  Visual Fields No apparent deficits  Perception  Comments appears WFL  Bed Mobility  Overal bed mobility Independent  Transfers  Overall transfer level Independent  Balance  Overall balance assessment No apparent balance deficits (not formally assessed)  OT - End  of Session  Activity Tolerance Patient tolerated treatment well  Patient left in bed;with call bell/phone within reach;with bed alarm set  Nurse Communication Other (comment) (R IV site; DC  recommendations)  OT Assessment  OT Recommendation/Assessment Patient does not need any further OT services  OT Visit Diagnosis Other symptoms and signs involving cognitive function  OT Problem List Decreased cognition;Obesity  AM-PAC OT "6 Clicks" Daily Activity Outcome Measure (Version 2)  Help from another person eating meals? 4  Help from another person taking care of personal grooming? 4  Help from another person toileting, which includes using toliet, bedpan, or urinal? 4  Help from another person bathing (including washing, rinsing, drying)? 4  Help from another person to put on and taking off regular upper body clothing? 4  Help from another person to put on and taking off regular lower body clothing? 4  6 Click Score 24  OT Recommendation  Follow Up Recommendations No OT follow up;Supervision - Intermittent (Direct S with medication and financial management; refrain from driving until cleared by MD)  OT Equipment None recommended by OT  Acute Rehab OT Goals  Patient Stated Goal to go home  OT Goal Formulation All assessment and education complete, DC therapy  OT Time Calculation  OT Start Time (ACUTE ONLY) 1335  OT Stop Time (ACUTE ONLY) 1400  OT Time Calculation (min) 25 min  OT General Charges  $OT Visit 1 Visit  OT Evaluation  $OT Eval Moderate Complexity 1 Mod  OT Treatments  $Self Care/Home Management  8-22 mins  Written Expression  Dominant Hand Right  Maurie Boettcher, OT/L   Acute OT Clinical Specialist Acute Rehabilitation Services Pager [phone] Office [phone]

## 2020-11-22 NOTE — Evaluation (Signed)
Speech Language Pathology Evaluation Patient Details Name: KYMONI MONDAY MRN: [id number] DOB: 1950/11/12 Today's Date: 11/22/2020 Time: 68-1540 SLP Time Calculation (min) (ACUTE ONLY): 20 min  Problem List:  Patient Active Problem List   Diagnosis Date Noted  . 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 Episodic lightheadedness 10/14/2019  . 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 Palpitations 10/14/2019  . RBBB (right bundle branch block) 10/14/2019  . CAD in native artery 06/22/2019  . Paroxysmal atrial fibrillation ([place]) 06/22/2019  . Shortness of breath 06/08/2019  . Abnormal myocardial perfusion study 07/14/2018  . Chest discomfort 07/14/2018  . Dyslipidemia 07/14/2018  . Essential hypertension 07/14/2018  . Hyperprolactinemia ([place]) 06/02/2017  . Pituitary microadenoma ([place]) 06/02/2017   Past Medical History:  Past Medical History:  Diagnosis Date  . A-fib (HCC)    hx of  . Arthritis    RIGHT knee  . BBB (bundle branch block)    hx of R BBB  . Cataract    bilateral sx  . Clotting disorder [facility])    has a genetic clotting dx -unknown name  . Hypertension    on meds  . PONV (postoperative nausea and vomiting)   . Sleep apnea    use CPAP   Past Surgical History:  Past Surgical History:  Procedure Laterality Date  . CATARACT EXTRACTION, BILATERAL    . CHOLECYSTECTOMY    . COLONOSCOPY  2017   RG-mag citrtae (good)-TICS/TA x 1  . KNEE SURGERY Right   . LAPAROSCOPIC GASTRIC BANDING    . POLYPECTOMY  2017   TA x 1  . WISDOM TOOTH EXTRACTION    . WRIST RECONSTRUCTION Right    HPI:  Pt is a 70 y.o. male with PMH of CAD s/p cath, RBBB, LAFB, HTN, HLD, PAF/presumed atrial fibrillation (30 day Holter neg) with acute stroke. MRI brain 1/19: Small cortical acute to subacute infarct of the right temporal lobe. No hemorrhage. CXR 1/19: Mild increase in vascular congestion without interstitial edema. IV tPA was given.   Assessment / Plan /  Recommendation Clinical Impression  Pt participated in speech/language/cognition evaluation. 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 Pt stated that he frequently writes down provided information to ensure accurate recall. The [facility] Mental Status Examination was completed to evaluate the pt's cognitive-linguistic skills. He achieved a score of 26/30 which is below slightly the normal limits of 27 or more out of 30 and is suggestive of a mild impairment.  He exhibited difficulty with memory, but completed simple and higher-level informal cognitive-linguistic tasks without significant difficulty. Pt's cognitive-linguistic skills appear WFL for the average person. 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 Pt verbalized agreement; he stated that his wife is more likely to notice those sooner, and that OT has advised that his wife monitor/supervise his completion of financial management at least initially. Further acute skilled SLP services are not clinically indicated at this time.    SLP Assessment  SLP Recommendation/Assessment: Patient does not need any further Speech Lanaguage Pathology Services SLP Visit Diagnosis: Cognitive communication deficit (R41.841)    Follow Up Recommendations  None  Frequency and Duration           SLP Evaluation Cognition  Overall Cognitive Status: History of cognitive impairments - at baseline Arousal/Alertness: Awake/alert Orientation Level: Oriented X4 Attention: Focused;Sustained Focused Attention: Appears intact Sustained Attention: Appears  intact Memory: Impaired Memory Impairment: Storage deficit;Retrieval deficit;Decreased recall of new information (Immediate: 5/5; delayed: 3/5; with cues: 1/2) Awareness: Appears intact Problem Solving: Appears intact Executive Function: Reasoning;Sequencing;Organizing Reasoning: Appears intact Sequencing: Appears intact (clock drawing: 4/4) Organizing: Appears intact (Backward digit span: 2/2) Safety/Judgment: Appears intact       Comprehension  Auditory Comprehension Overall Auditory Comprehension: Appears within functional limits for tasks assessed Yes/No Questions: Within Functional Limits Commands: Within Functional Limits Conversation: Complex    Expression Expression Primary Mode of Expression: Verbal Verbal Expression Overall Verbal Expression: Appears within functional limits for tasks assessed Initiation: No impairment Level of Generative/Spontaneous Verbalization: Conversation Repetition: No impairment Naming: No impairment Pragmatics: No impairment Written Expression Dominant Hand: Right   Oral / Motor  Oral Motor/Sensory Function Overall Oral Motor/Sensory Function: Within functional limits Motor Speech Overall Motor Speech: Appears within functional limits for tasks assessed Respiration: Within functional limits Phonation: Normal Resonance: Within functional limits Articulation: Within functional limitis Intelligibility: Intelligible Motor Planning: Witnin functional limits Motor Speech Errors: Not applicable   Ellery Tash I. Hardin Negus, [place], [place] Office number [phone] Pager [place] 11/22/2020, 4:03 PM

## 2020-11-22 NOTE — Progress Notes (Signed)
PROGRESS NOTE    Lance Pham  IRC:[id number] DOB: 04/02/1951 DOA: 11/20/2020 PCP: Bonnita Nasuti, MD   Brief Narrative:  70 year old, with past medical history of CAD status post CABG, paroxysmal A. fib not on anticoagulations, HTN, HLD, presented with left-sided weakness and slurred speech.  He went to [facility] ED, code stroke was called and tPA was given and patient admitted to neuro ICU last night and shifted to [facility]. Patient's COVID screening came back positive.  Patient reported he has been having cough, but denies any shortness of breath, no chest pains.  Before that, patient had 2+m weeks, now has been having symptoms of dry cough, feeling weak, and went to COVID-19 tested x3 in the last 2 weeks with negative results.  Denies any fever chills, no diarrhea no loss of taste.  Patient was COVID vaccinated x2.  Assessment & Plan:   Active Problems:   Stroke (cerebrum) ([place])   INCIDENTAL COVID infection without hypoxia or pneumonia; unlikely acute infection -questionable cough since December - no acute indication for treatment.  Acute/subacute right cortical temporal lobe -Status post tPA -As needed BP meds  CAD -No chest pain -EKG mild bradycardia and frequent PVCs -Resume beta-blocker when allows PO meds  Hx of PAF -Anticoagulation on discharge per neuro  Consultants:   We are; neuro is primary  Procedures:   None  Antimicrobials:  None   Subjective: No acute issues/events overnight - back to baseline - requesting discharge which is certainly reasonable from our standpoint.   Objective: Vitals:   11/22/20 0600 11/22/20 0615 11/22/20 0630 11/22/20 0645  BP: 109/65     Pulse: (!) 44 (!) 40 (!) 44 (!) 34  Resp: (!) 21 19 (!) 22 (!) 23  Temp:      TempSrc:      SpO2: 96% 96% 94% 95%  Weight:      Height:        Intake/Output Summary (Last 24 hours) at 11/22/2020 0718 Last data filed at 11/22/2020 0600 Gross per 24 hour  Intake 599.57 ml   Output 850 ml  Net -250.43 ml   Filed Weights   11/22/20 0150  Weight: 134.2 kg    Examination:  General exam: Appears calm and comfortable  Respiratory system: Clear to auscultation. Respiratory effort normal. Cardiovascular system: S1 & S2 heard, RRR. No JVD, murmurs, rubs, gallops or clicks. No pedal edema. Gastrointestinal system: Abdomen is nondistended, soft and nontender. No organomegaly or masses felt. Normal bowel sounds heard. Central nervous system: Alert and oriented. No focal neurological deficits. Extremities: Symmetric 5 x 5 power. Skin: No rashes, lesions or ulcers Psychiatry: Judgement and insight appear normal. Mood & affect appropriate.     Data Reviewed: I have personally reviewed following labs and imaging studies  CBC: Recent Labs  Lab 11/20/20 2253  WBC 11.0*  NEUTROABS 5.0  HGB 11.1*  HCT 34.6*  MCV 103.3*  PLT 510   Basic Metabolic Panel: Recent Labs  Lab 11/20/20 2253 11/21/20 0216 11/22/20 0313  NA 133*  --   --   K 4.5  --   --   CL 101  --   --   CO2 23  --   --   GLUCOSE 109*  --   --   BUN 17  --   --   CREATININE 1.39*  --   --   CALCIUM 8.4*  --   --   MG  --  1.5* 1.8  PHOS  --  4.1 3.1   GFR: Estimated Creatinine Clearance: 72.1 mL/min (A) (by C-G formula based on SCr of 1.39 mg/dL (H)). Liver Function Tests: Recent Labs  Lab 11/20/20 2253  AST 74*  ALT 37  ALKPHOS 50  BILITOT 1.0  PROT 7.7  ALBUMIN 2.8*   No results for input(s): LIPASE, AMYLASE in the last 168 hours. No results for input(s): AMMONIA in the last 168 hours. Coagulation Profile: Recent Labs  Lab 11/20/20 2253  INR 5.5*   Cardiac Enzymes: No results for input(s): CKTOTAL, CKMB, CKMBINDEX, TROPONINI in the last 168 hours. BNP (last 3 results) No results for input(s): PROBNP in the last 8760 hours. HbA1C: Recent Labs    11/21/20 0216  HGBA1C 5.8*   CBG: Recent Labs  Lab 11/22/20 0220  GLUCAP 80   Lipid Profile: Recent Labs     11/21/20 0216  CHOL 113  HDL 22*  LDLCALC 72  TRIG 96  CHOLHDL 5.1   Thyroid Function Tests: No results for input(s): TSH, T4TOTAL, FREET4, T3FREE, THYROIDAB in the last 72 hours. Anemia Panel: Recent Labs    11/21/20 2130 11/22/20 0313  FERRITIN 312 319   Sepsis Labs: Recent Labs  Lab 11/21/20 2130  PROCALCITON <0.10    No results found for this or any previous visit (from the past 240 hour(s)).       Radiology Studies: CT HEAD WO CONTRAST  Result Date: 11/21/2020 CLINICAL DATA:  Follow-up stroke status post tPA. EXAM: CT HEAD WITHOUT CONTRAST TECHNIQUE: Contiguous axial images were obtained from the base of the skull through the vertex without intravenous contrast. COMPARISON:  Prior MRI from earlier same day as well as previous CTs from 11/20/2020. FINDINGS: Brain: Previously identified subcentimeter cortical infarcts involving the right cerebral hemisphere are not well visualized by CT. No visible acute large vessel territory infarct. No acute intracranial hemorrhage status post tPA administration. No mass lesion, midline shift or mass effect. No hydrocephalus or extra-axial fluid collection. Vascular: No hyperdense vessel. Scattered vascular calcifications noted within the carotid siphons. Skull: Scalp soft tissues and calvarium within normal limits. Sinuses/Orbits: Globes and orbital soft tissues demonstrate no acute finding. Scattered mucosal thickening noted within the ethmoidal air cells and maxillary sinuses. Small right mastoid effusion again noted. Other: None. IMPRESSION: 1. Previously identified subcentimeter cortical infarcts involving the right cerebral hemisphere are not well visualized by CT. No acute intracranial hemorrhage status post tPA administration. 2. No other new acute intracranial abnormality. Electronically Signed   By: Jeannine Boga M.D.   On: 11/21/2020 20:33   MR BRAIN WO CONTRAST  Result Date: 11/21/2020 CLINICAL DATA:  Stroke follow-up,  right MCA occlusion post tPA EXAM: MRI HEAD WITHOUT CONTRAST TECHNIQUE: Multiplanar, multiecho pulse sequences of the brain and surrounding structures were obtained without intravenous contrast. COMPARISON:  MRI 05/20/2017, head CT 11/20/2020 FINDINGS: Motion artifact is present. Brain: Small focus of right superior temporal cortical diffusion hyperintensity. There is no evidence of intracranial hemorrhage. Ventricles and sulci are within normal limits in size and configuration. There is no intracranial mass or mass effect. There is no hydrocephalus or extra-axial fluid collection. Vascular: Major vessel flow voids at the skull base are preserved. Skull and upper cervical spine: Normal marrow signal is preserved. Sinuses/Orbits: Paranasal sinus mucosal thickening. Bilateral lens replacements. Other: Sella is unremarkable. Pituitary microadenoma on prior study is not well evaluated on this examination. Patchy mastoid fluid opacification. IMPRESSION: Small cortical acute to subacute infarct of the right temporal lobe. No hemorrhage. Electronically Signed   By:  Macy Mis M.D.   On: 11/21/2020 08:35   CT CEREBRAL PERFUSION W CONTRAST  Result Date: 11/20/2020 CLINICAL DATA:  Follow-up examination for acute stroke, right MCA occlusion on prior CTA. EXAM: CT PERFUSION BRAIN TECHNIQUE: Multiphase CT imaging of the brain was performed following IV bolus contrast injection. Subsequent parametric perfusion maps were calculated using RAPID software. CONTRAST:  4mL OMNIPAQUE IOHEXOL 350 MG/ML SOLN COMPARISON:  Comparison made with prior CT and CTA from earlier the same day. FINDINGS: CT Brain Perfusion Findings: CBF (<30%) Volume: 10mL Perfusion (Tmax>6.0s) volume: 65mL Mismatch Volume: 62mL ASPECTS on noncontrast CT Head: 10 at 7:08 p.m. today. Infarct Core: 0 mL Infarction Location:Apparent 50 cc perfusion deficit seen involving the mid and posterior right MCA distribution, involving the posterior right frontoparietal  region. Finding is in keeping with the previously identified right MCA occlusion. No evidence for acute core infarct by CT perfusion. IMPRESSION: 50 cc perfusion deficit involving the mid and posterior right MCA distribution, in keeping with the previously identified right MCA occlusion. No evidence for acute core infarct by CT perfusion. Electronically Signed   By: Jeannine Boga M.D.   On: 11/20/2020 23:13   DG CHEST PORT 1 VIEW  Result Date: 11/21/2020 CLINICAL DATA:  Hypoxia EXAM: PORTABLE CHEST 1 VIEW COMPARISON:  11/20/2020 FINDINGS: Cardiac shadow is enlarged but stable. Lungs are well aerated bilaterally. No focal infiltrate is seen. Mild vascular congestion is noted without interstitial edema. No bony abnormality is seen. IMPRESSION: Mild increase in vascular congestion without interstitial edema. Electronically Signed   By: Inez Catalina M.D.   On: 11/21/2020 15:41    Scheduled Meds: . Chlorhexidine Gluconate Cloth  6 each Topical Daily  . guaiFENesin  600 mg Oral BID  . labetalol  20 mg Intravenous Once  . pantoprazole (PROTONIX) IV  40 mg Intravenous QHS  . senna-docusate  1 tablet Oral QHS   Continuous Infusions: . sodium chloride 100 mL/hr at 11/22/20 0600  . niCARDipine       LOS: 1 day   Time spent: 39min  Gradie Butrick C Lasharn Bufkin, DO [facility]  If 7PM-7AM, please contact night-coverage [email or web address]  11/22/2020, 7:18 AM

## 2020-11-22 NOTE — Progress Notes (Signed)
OT Cancellation Note  Patient Details Name: Lance Pham MRN: [id number] DOB: Jan 25, 1951   Cancelled Treatment:    Reason Eval/Treat Not Completed: Active bedrest order. Also noted with elevated d-dimer. Will continue to follow for therapy readiness.   Layla Maw 11/22/2020, 8:35 AM

## 2020-11-22 NOTE — Progress Notes (Signed)
PT Cancellation Note  Patient Details Name: Lance Pham MRN: [id number] DOB: [birth year]   Cancelled Treatment:    Reason Eval/Treat Not Completed: Active bedrest order   Sandy Salaam Naod [place] 11/22/2020, 7:25 AM  Bayard Males, PT Acute Rehabilitation Services Pager: [phone] Office: [phone]

## 2020-11-22 NOTE — Evaluation (Signed)
Physical Therapy Evaluation/ Discharge Patient Details Name: Lance Pham MRN: [id number] DOB: 1951/02/08 Today's Date: 11/22/2020   History of Present Illness  70 yo admitted to [facility] 1/18 with left weakness and slurred speech with Rt temporal lobe CVA s/p tPA then transferred to [facility]. Pt also Covid +. PMhx: CAD s/p CABG, Afib, HTN, HLD, clotting disorder  Clinical Impression  Pt supine on arrival and very willing to participate. Pt reports he never had any weakness or slurred speech and that his wife said he was having trouble with vision on the left but he states he is fine but there was an error in assessment by EMS. Pt aware of MRI. Pt moving well and demonstrates [facility] strength and sensation bil UE and LE. Pt with good mobility and balance but limited to in room assessment which alters ability to see full function. Pt reports no deficits and eager to return home. Encouraged mobility with nursing acutely and no further acute physical therapy needs at this time, will sign off with pt agreeable.    Follow Up Recommendations No PT follow up    Equipment Recommendations  None recommended by PT    Recommendations for Other Services       Precautions / Restrictions Precautions Precautions: None Restrictions Weight Bearing Restrictions: No      Mobility  Bed Mobility Overal bed mobility: Independent                  Transfers Overall transfer level: Independent                  Ambulation/Gait Ambulation/Gait assistance: Independent Gait Distance (Feet): 50 Feet Assistive device: None Gait Pattern/deviations: Step-through pattern;Decreased stride length   Gait velocity interpretation: >2.62 ft/sec, indicative of community ambulatory General Gait Details: decreased stride due to small space and confined to walking in room. Pt cued to make laps in room but stopped and sat EOB after 50', pt then walked additional 12' to chair  Stairs             Wheelchair Mobility    Modified Rankin (Stroke Patients Only) Modified Rankin (Stroke Patients Only) Pre-Morbid Rankin Score: No symptoms Modified Rankin: No symptoms     Balance Overall balance assessment: Independent                                           Pertinent Vitals/Pain Pain Assessment: No/denies pain    Home Living Family/patient expects to be discharged to:: Private residence Living Arrangements: Spouse/significant other Available Help at Discharge: Family;Available 24 hours/day Type of Home: House Home Access: Stairs to enter   [employer] of Steps: 2 Home Layout: Multi-level;Able to live on main level with bedroom/bathroom Home Equipment: None      Prior Function Level of Independence: Independent         Comments: 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     Journalist, newspaper        Extremity/Trunk Assessment   Upper Extremity Assessment Upper Extremity Assessment: Overall WFL for tasks assessed    Lower Extremity Assessment Lower Extremity Assessment: Overall WFL for tasks assessed    Cervical / Trunk Assessment Cervical / Trunk Assessment: Normal  Communication   Communication: No difficulties  Cognition Arousal/Alertness: Awake/alert Behavior During Therapy: Flat affect Overall Cognitive Status: Impaired/Different from baseline Area of Impairment: Problem solving  General Comments: pt stating he thinks he was misdiagnosed and never had a stroke. Pt needing repetition at times during session for task. Pt oriented, able to name the president, and answer simple money management question      General Comments General comments (skin integrity, edema, etc.): pt able to performed rhomberg with eyes closed 30 sec, tandem stance bil 10 sec. single limb stance 5 sec each leg, pt able to pick object off floor and turn bil to look behind him     Exercises     Assessment/Plan    PT Assessment Patent does not need any further PT services  PT Problem List         PT Treatment Interventions      PT Goals (Current goals can be found in the Care Plan section)  Acute Rehab PT Goals PT Goal Formulation: All assessment and education complete, DC therapy    Frequency     Barriers to discharge        Co-evaluation               AM-PAC PT "6 Clicks" Mobility  Outcome Measure Help needed turning from your back to your side while in a flat bed without using bedrails?: None Help needed moving from lying on your back to sitting on the side of a flat bed without using bedrails?: None Help needed moving to and from a bed to a chair (including a wheelchair)?: None Help needed standing up from a chair using your arms (e.g., wheelchair or bedside chair)?: None Help needed to walk in hospital room?: None Help needed climbing 3-5 steps with a railing? : None 6 Click Score: 24    End of Session Equipment Utilized During Treatment: Gait belt Activity Tolerance: Patient tolerated treatment well Patient left: in chair;with call bell/phone within reach Nurse Communication: Mobility status PT Visit Diagnosis: Other abnormalities of gait and mobility (R26.89)    Time: 7169-6789 PT Time Calculation (min) (ACUTE ONLY): 20 min   Charges:   PT Evaluation $PT Eval Moderate Complexity: 1 Mod          Clarence, PT Acute Rehabilitation Services Pager: [phone] Office: [place] 11/22/2020, 1:18 PM

## 2020-11-22 NOTE — Discharge Instructions (Signed)

## 2020-11-22 NOTE — TOC Benefit Eligibility Note (Signed)
Transition of Care [facility]) Benefit Eligibility Note    Patient Details  Name: Lance Pham MRN: [id number] Date of Birth: 1951/07/09   Medication/Dose: Arne Cleveland  5 MG BID  Covered?: Yes  Tier: 3 Drug  Prescription Coverage Preferred Pharmacy: Roseanne Kaufman with Person/Company/Phone Number:: [facility]  # [phone]  Co-Pay: $45.00  Prior Approval: No  Deductible: Met  Additional Notes: APIXABAN : Crecencio Mc Phone Number: 11/22/2020, 4:10 PM

## 2020-11-22 NOTE — Progress Notes (Signed)
  Echocardiogram 2D Echocardiogram has been performed.  Lance Pham 11/22/2020, 1:04 PM

## 2020-11-22 NOTE — Progress Notes (Signed)
ANTICOAGULATION CONSULT NOTE - Initial Consult  Pharmacy Consult for Apixaban Indication: atrial fibrillation  Allergies  Allergen Reactions  . Nsaids Other (See Comments)    Patient had a Lap band placed is suppose to have NO NSAIDS due to the possibility of esophageal erosion leading to band "coming through"  . Tamiflu [Oseltamivir] Nausea Only and Other (See Comments)    Stomach upset    Patient Measurements: Height: 6\' 1"  (185.4 cm) Weight: 134.2 kg (295 lb 13.7 oz) IBW/kg (Calculated) : 79.9  Vital Signs: Temp: 98.7 F (37.1 C) (01/20 1200) Temp Source: Oral (01/20 1200) BP: 146/68 (01/20 1200) Pulse Rate: 53 (01/20 1200)  Labs: Recent Labs    11/20/20 2253 11/21/20 0216 11/21/20 2130 11/22/20 0105 11/22/20 0313 11/22/20 0744  HGB 11.1*  --   --   --   --  9.4*  HCT 34.6*  --   --   --   --  29.4*  PLT 177  --   --   --   --  159  APTT 68*  --   --   --   --  25  LABPROT 48.2*  --   --   --   --  16.5*  INR 5.5*  --   --   --   --  1.4*  CREATININE 1.39*  --   --   --   --   --   TROPONINIHS  --    < > 49* 50* 54*  --    < > = values in this interval not displayed.    Estimated Creatinine Clearance: 72.1 mL/min (A) (by C-G formula based on SCr of 1.39 mg/dL (H)).   Medical History: Past Medical History:  Diagnosis Date  . A-fib (HCC)    hx of  . Arthritis    RIGHT knee  . BBB (bundle branch block)    hx of R BBB  . Cataract    bilateral sx  . Clotting disorder [facility])    has a genetic clotting dx -unknown name  . Hypertension    on meds  . PONV (postoperative nausea and vomiting)   . Sleep apnea    use CPAP    Assessment: 70 yo male presented 11/21/2019 with stroke s/p alteplase at OSH. Pharmacy consulted to dose apixaban for Afib. Patient has a PMH of Afib and was not on anticoagulation prior to admission (unknown reason - no documentation in [facility]). Age < 81. Wt > 60kg. Scr 1.39. CT head negative for ICH on 1/19.   Goal of Therapy:  Monitor  platelets by anticoagulation protocol: Yes   Plan:  Start apixaban 5mg  twice daily  Pharmacy to provide education  Consult placed to care management to assess cost of apixaban   Cristela Felt, PharmD Clinical Pharmacist  11/22/2020,2:00 PM

## 2020-11-30 DIAGNOSIS — I10 Essential (primary) hypertension: Secondary | ICD-10-CM | POA: Diagnosis not present

## 2020-11-30 DIAGNOSIS — N401 Enlarged prostate with lower urinary tract symptoms: Secondary | ICD-10-CM | POA: Diagnosis not present

## 2020-11-30 DIAGNOSIS — E782 Mixed hyperlipidemia: Secondary | ICD-10-CM | POA: Diagnosis not present

## 2020-11-30 DIAGNOSIS — F331 Major depressive disorder, recurrent, moderate: Secondary | ICD-10-CM | POA: Diagnosis not present

## 2020-11-30 DIAGNOSIS — N3281 Overactive bladder: Secondary | ICD-10-CM | POA: Diagnosis not present

## 2020-12-04 ENCOUNTER — Encounter: Payer: Self-pay | Admitting: *Deleted

## 2020-12-04 ENCOUNTER — Other Ambulatory Visit: Payer: Self-pay | Admitting: *Deleted

## 2020-12-04 ENCOUNTER — Encounter: Payer: Self-pay | Admitting: Cardiology

## 2020-12-04 DIAGNOSIS — D518 Other vitamin B12 deficiency anemias: Secondary | ICD-10-CM | POA: Insufficient documentation

## 2020-12-04 DIAGNOSIS — E1165 Type 2 diabetes mellitus with hyperglycemia: Secondary | ICD-10-CM

## 2020-12-04 DIAGNOSIS — R1111 Vomiting without nausea: Secondary | ICD-10-CM | POA: Insufficient documentation

## 2020-12-04 DIAGNOSIS — F331 Major depressive disorder, recurrent, moderate: Secondary | ICD-10-CM

## 2020-12-04 DIAGNOSIS — J069 Acute upper respiratory infection, unspecified: Secondary | ICD-10-CM

## 2020-12-04 DIAGNOSIS — N401 Enlarged prostate with lower urinary tract symptoms: Secondary | ICD-10-CM | POA: Insufficient documentation

## 2020-12-04 DIAGNOSIS — E039 Hypothyroidism, unspecified: Secondary | ICD-10-CM

## 2020-12-04 DIAGNOSIS — R6883 Chills (without fever): Secondary | ICD-10-CM

## 2020-12-04 DIAGNOSIS — E2740 Unspecified adrenocortical insufficiency: Secondary | ICD-10-CM

## 2020-12-04 DIAGNOSIS — F5081 Binge eating disorder: Secondary | ICD-10-CM

## 2020-12-04 DIAGNOSIS — E611 Iron deficiency: Secondary | ICD-10-CM | POA: Insufficient documentation

## 2020-12-04 DIAGNOSIS — F411 Generalized anxiety disorder: Secondary | ICD-10-CM | POA: Insufficient documentation

## 2020-12-04 DIAGNOSIS — F3342 Major depressive disorder, recurrent, in full remission: Secondary | ICD-10-CM

## 2020-12-04 DIAGNOSIS — R509 Fever, unspecified: Secondary | ICD-10-CM

## 2020-12-04 DIAGNOSIS — G729 Myopathy, unspecified: Secondary | ICD-10-CM

## 2020-12-04 DIAGNOSIS — K219 Gastro-esophageal reflux disease without esophagitis: Secondary | ICD-10-CM

## 2020-12-04 DIAGNOSIS — E860 Dehydration: Secondary | ICD-10-CM

## 2020-12-04 DIAGNOSIS — E291 Testicular hypofunction: Secondary | ICD-10-CM

## 2020-12-04 DIAGNOSIS — F50819 Binge eating disorder, unspecified: Secondary | ICD-10-CM | POA: Insufficient documentation

## 2020-12-04 DIAGNOSIS — K5900 Constipation, unspecified: Secondary | ICD-10-CM

## 2020-12-04 DIAGNOSIS — J309 Allergic rhinitis, unspecified: Secondary | ICD-10-CM

## 2020-12-04 DIAGNOSIS — E782 Mixed hyperlipidemia: Secondary | ICD-10-CM

## 2020-12-04 DIAGNOSIS — I739 Peripheral vascular disease, unspecified: Secondary | ICD-10-CM

## 2020-12-04 DIAGNOSIS — N3281 Overactive bladder: Secondary | ICD-10-CM | POA: Insufficient documentation

## 2020-12-04 DIAGNOSIS — L0291 Cutaneous abscess, unspecified: Secondary | ICD-10-CM

## 2020-12-04 DIAGNOSIS — E559 Vitamin D deficiency, unspecified: Secondary | ICD-10-CM

## 2020-12-04 DIAGNOSIS — Z79899 Other long term (current) drug therapy: Secondary | ICD-10-CM

## 2020-12-04 HISTORY — DX: Other long term (current) drug therapy: Z79.899

## 2020-12-04 HISTORY — DX: Binge eating disorder: F50.81

## 2020-12-04 HISTORY — DX: Gastro-esophageal reflux disease without esophagitis: K21.9

## 2020-12-04 HISTORY — DX: Iron deficiency: E61.1

## 2020-12-04 HISTORY — DX: Hypothyroidism, unspecified: E03.9

## 2020-12-04 HISTORY — DX: Testicular hypofunction: E29.1

## 2020-12-04 HISTORY — DX: Major depressive disorder, recurrent, moderate: F33.1

## 2020-12-04 HISTORY — DX: Fever, unspecified: R50.9

## 2020-12-04 HISTORY — DX: Mixed hyperlipidemia: E78.2

## 2020-12-04 HISTORY — DX: Acute intermittent (hepatic) porphyria: E80.21

## 2020-12-04 HISTORY — DX: Overactive bladder: N32.81

## 2020-12-04 HISTORY — DX: Chills (without fever): R68.83

## 2020-12-04 HISTORY — DX: Vomiting without nausea: R11.11

## 2020-12-04 HISTORY — DX: Allergic rhinitis, unspecified: J30.9

## 2020-12-04 HISTORY — DX: Myopathy, unspecified: G72.9

## 2020-12-04 HISTORY — DX: Constipation, unspecified: K59.00

## 2020-12-04 HISTORY — DX: Type 2 diabetes mellitus with hyperglycemia: E11.65

## 2020-12-04 HISTORY — DX: Cutaneous abscess, unspecified: L02.91

## 2020-12-04 HISTORY — DX: Major depressive disorder, recurrent, in full remission: F33.42

## 2020-12-04 HISTORY — DX: Dehydration: E86.0

## 2020-12-04 HISTORY — DX: Binge eating disorder, unspecified: F50.819

## 2020-12-04 HISTORY — DX: Peripheral vascular disease, unspecified: I73.9

## 2020-12-04 HISTORY — DX: Other vitamin B12 deficiency anemias: D51.8

## 2020-12-04 HISTORY — DX: Benign prostatic hyperplasia with lower urinary tract symptoms: N40.1

## 2020-12-04 HISTORY — DX: Unspecified adrenocortical insufficiency: E27.40

## 2020-12-04 HISTORY — DX: Vitamin D deficiency, unspecified: E55.9

## 2020-12-04 HISTORY — DX: Acute upper respiratory infection, unspecified: J06.9

## 2020-12-04 HISTORY — DX: Generalized anxiety disorder: F41.1

## 2020-12-04 LAB — FACTOR 5 LEIDEN

## 2020-12-04 NOTE — Patient Outreach (Signed)
[name]) Care Management  12/04/2020  Lance Pham Mar 10, 1951 [id number]   RED ON EMMI ALERT - Stroke Day # 9 Date: 1/31 Red Alert Reason: Lost interest in things   Outreach attempt #1, successful.  Identity verified.  This care manager introduced self and stated purpose of call.  [facility] care management services explained.    Member report he is doing well physically, was assessed post stroke for need for speech therapy, PT, and OT, none of which was needed.  State he has lost interest in a few things, such as TV shows, going out.  Admits that he does have history of depression and anxiety, taking Wellbutrin and Prozac for conditions.  No need for counseling at this time, state he is "taking it a day at at time."  He had appointment with PCP on 1/21, will see neurology on 2/22 and cardiology on 2/23, wife will accompany him to visits.  Denies concerns at this time, agrees to contact this care manager with questions.  Plan: RN CM will follow up with member after appointments with cardiology and neurology, will close case at that time.  Valente David, [place], MSN [place] [phone]

## 2020-12-13 DIAGNOSIS — G4733 Obstructive sleep apnea (adult) (pediatric): Secondary | ICD-10-CM | POA: Diagnosis not present

## 2020-12-16 NOTE — Progress Notes (Addendum)
Cardiology Office Note:    Date:  12/17/2020   ID:  COLLINS KERBY, DOB 1951-08-03, MRN [id number]  PCP:  Bonnita Nasuti, MD  Cardiologist:  Shirlee More, MD   Referring MD: Bonnita Nasuti, MD  ASSESSMENT:    1. Chronic anticoagulation   2. Cerebrovascular accident (CVA) due to embolism of right middle cerebral artery (HCC)   3. Paroxysmal atrial fibrillation ([place])   4. CAD in native artery   5. Essential hypertension   6. Mixed hyperlipidemia    PLAN:    In order of problems listed above:  1. Remain long-term anticoagulated with atrial fibrillation and stroke.  If not a good candidate long-term left atrial occlusion would be appropriate watchman device. 2. I am concerned about his hypotension stop ACE inhibitor hold metoprolol systolics less than 540 reduce the dose and he has arrangements follow-up with neurology next week. 3. Stable if not if having frequent episodes would benefit from an antiarrhythmic drug.  With his bifascicular heart block and bradycardia I may consider applying an event monitor at next office follow-up. 4. Stable CAD no angina continue current medical therapy.  I would not advise an ischemia evaluation at this time. 5. I am concerned with hypotension stop ACE hold beta-blocker.  I reviewed subsequent blood pressures 12/18/1998 22-0 28 2022 in general is final blood pressure is in range at times gets systolics as low was 086/76 and I would not change his current medical treatment.  01/12/2021 6. Stable continue statin  Next appointment 6 weeks   Medication Adjustments/Labs and Tests Ordered: Current medicines are reviewed at length with the patient today.  Concerns regarding medicines are outlined above.  Orders Placed This Encounter  Procedures  . EKG 12-Lead   No orders of the defined types were placed in this encounter.    No chief complaint on file.   History of Present Illness:    Lance Pham is a 70 y.o. male who is being seen today  to establish cardiology care at the request of Hague, Imran P, MD.  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  Left heart cath 06/22/2019 will be copied and placed in the record for completeness: Angiographic findings  Cardiac Arteries and Lesion Findings  LMCA: Normal appearance with 0% stenosis.  LAD: The LAD is a large artery that wrap around the apex It is free of disease  all the way down to the apex After wrapping around the apex, there is a  significant stenosis. The vessel at this level is probably only 1 mm in  diameter. Very unlikely will produce any symptoms It is a very small erritory  involved.  LCx: Normal appearance with 0% stenosis.  RCA: There is a 20-30% stenosis proximal Otherwise it is free of significant  He was recently admitted to [facility] discharged 1/95/0932 with acute embolic stroke and occlusion of the right middle cerebral artery distal M1 and proximal M2 felt to be due to atrial fibrillation hypertension hyperlipidemia.  He was treated with TPA with functional improvement for stroke.  Discharged with Eliquis anticoagulation.  He also had COVID-19 infection associated with his embolic stroke. Echocardiogram performed 11/23/2019 showed mild concentric LVH normal size ejection fraction normal 55 to 60% he had moderate biatrial enlargement and no significant valvular abnormality.  Contrast injection  was not performed. EKG 11/21/2020 showed sinus rhythm PR interval normal right bundle branch block left anterior hemiblock and PVCs.  He was anemic at the time of discharge hemoglobin 9.4 The high sensitivity troponin was elevated in a non-ACS pattern. N-terminal high-sensitivity was not significantly elevated, up to 37. Lipid panel showed cholesterol 113 LDL 72  triglycerides 96 HDL 22. CKD was present with a creatinine 1.39 GFR estimated 55 cc/min.  Although he had permissive hypertension in the hospital he is restarted on ACE inhibitor he is feeling very weak unable to do activities his wife tells me his blood pressure runs 90-100 at home similar to what it is in my office. He will stop his ACE inhibitor reduced beta-blocker dose and she will trend blood pressures at home. 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 He tolerates his anticoagulant without bleeding No chest pain palpitation or syncope. Past Medical History:  Diagnosis Date  . A-fib (HCC)    hx of  . Abnormal myocardial perfusion study 07/14/2018  . Acute intermittent porphyria ([place]) 12/04/2020  . Acute upper respiratory infection 12/04/2020  . Allergic rhinitis 12/04/2020  . Arthritis    RIGHT knee  . BBB (bundle branch block)    hx of R BBB  . Benign prostatic hyperplasia with lower urinary tract symptoms 12/04/2020  . Binge eating disorder 12/04/2020  . CAD in native artery 06/22/2019  . Cataract    bilateral sx  . Chest discomfort 07/14/2018  . Chill 12/04/2020  . Clotting disorder [facility])    has a genetic clotting dx -unknown name  . Constipation 12/04/2020  . 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[place]) 12/04/2020  . Cutaneous abscess 12/04/2020  . Dyslipidemia 07/14/2018  . Episodic lightheadedness 10/14/2019  . Essential hypertension 07/14/2018  . Fever 12/04/2020  . Gastro-esophageal reflux disease without esophagitis 12/04/2020  . Generalized anxiety disorder 12/04/2020  . Hyperglycemia due to type 2 diabetes mellitus ([place]) 12/04/2020  . Hyperprolactinemia ([place]) 06/02/2017  . Hypertension    on meds  . Hypothyroidism 12/04/2020  . Iron deficiency 12/04/2020  . 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 Mixed hyperlipidemia 12/04/2020  . Moderate recurrent major depression  ([place]) 12/04/2020  . Myopathy 12/04/2020  . Other long term (current) drug therapy 12/04/2020  . Other vitamin B12 deficiency anemias 12/04/2020  . Overactive bladder 12/04/2020  . Palpitations 10/14/2019  . Paroxysmal atrial fibrillation ([place]) 06/22/2019  . Peripheral vascular disease ([place]) 12/04/2020  . Pituitary microadenoma ([place]) 06/02/2017   Formatting of this note might be different from the original. 3 mm pituitary microadenoma. Likely nonfunctional.  . PONV (postoperative nausea and vomiting)   . RBBB (right bundle branch block) 10/14/2019  . Recurrent major depression in full remission ([place]) 12/04/2020  . Shortness of breath 06/08/2019  . Sleep apnea    use CPAP  . Stroke (cerebrum) ([place]) 11/21/2020  . Testicular hypofunction 12/04/2020  . Vitamin D deficiency 12/04/2020  . Vomiting without nausea 12/04/2020    Past Surgical History:  Procedure Laterality Date  . CATARACT EXTRACTION, BILATERAL    . CHOLECYSTECTOMY    . COLONOSCOPY  2017   RG-mag citrtae (good)-TICS/TA x 1  . FOOT SURGERY Left    tendon link  . HAMMER TOE SURGERY    . KNEE SURGERY Right   . LAPAROSCOPIC GASTRIC BANDING    . POLYPECTOMY  2017   TA x 1  . WISDOM TOOTH  EXTRACTION    . WRIST RECONSTRUCTION Right     Current Medications: Current Meds  Medication Sig  . albuterol (VENTOLIN HFA) 108 (90 Base) MCG/ACT inhaler Inhale 1 puff into the lungs every 4 (four) hours as needed for wheezing or shortness of breath.  Marland Kitchen apixaban (ELIQUIS) 5 MG TABS tablet Take 5 mg by mouth 2 (two) times daily.  Marland Kitchen atorvastatin (LIPITOR) 20 MG tablet Take 20 mg by mouth at bedtime.  Marland Kitchen buPROPion (WELLBUTRIN) 75 MG tablet Take 75 mg by mouth at bedtime.  . Cholecalciferol (VITAMIN D3) 125 MCG (5000 UT) CAPS Take 5,000 Units by mouth at bedtime.  . cyanocobalamin (,VITAMIN B-12,) 1000 MCG/ML injection Inject 1,000 mg into the muscle every 30 (thirty) days.  . ferrous sulfate 324 (65 Fe) MG TBEC Take 65 mg by mouth daily after supper.  Marland Kitchen  FLUoxetine (PROZAC) 40 MG capsule Take 40 mg by mouth at bedtime.  . folic acid (FOLVITE) 010 MCG tablet Take 400 mcg by mouth at bedtime.  Marland Kitchen guaiFENesin (MUCINEX) 600 MG 12 hr tablet Take 1 tablet (600 mg total) by mouth 2 (two) times daily. As needed for  congestion  . metoprolol succinate (TOPROL-XL) 25 MG 24 hr tablet Take 12.5 mg by mouth at bedtime.  . tamsulosin (FLOMAX) 0.4 MG CAPS capsule Take 0.4 mg by mouth at bedtime.  Marland Kitchen testosterone cypionate (DEPOTESTOTERONE CYPIONATE) 100 MG/ML injection Inject 100 mg into the muscle every Thursday. For IM use only  . tolterodine (DETROL LA) 4 MG 24 hr capsule Take 4 mg by mouth at bedtime.  . [DISCONTINUED] lisinopril (ZESTRIL) 10 MG tablet Take 10 mg by mouth at bedtime.     Allergies:   Nsaids   Social History   Socioeconomic History  . Marital status: Married    Spouse name: Not on file  . Number of children: Not on file  . Years of education: Not on file  . Highest education level: Not on file  Occupational History  . Not on file  Tobacco Use  . Smoking status: Never Smoker  . Smokeless tobacco: Never Used  Vaping Use  . Vaping Use: Never used  Substance and Sexual Activity  . Alcohol use: Never  . Drug use: Never  . Sexual activity: Not on file  Other Topics Concern  . Not on file  Social History Narrative  . Not on file   Social Determinants of Health   Financial Resource Strain: Not on file  Food Insecurity: Not on file  Transportation Needs: Not on file  Physical Activity: Not on file  Stress: Not on file  Social Connections: Not on file     Family History: The patient's family history includes Colon cancer (age of onset: 23) in his mother; Colon polyps in his brother, sister, sister, sister, and sister; Colon polyps (age of onset: 54) in his mother; Thyroid cancer (age of onset: 26) in his father. There is no history of Esophageal cancer, Stomach cancer, or Rectal cancer.  ROS:   ROS Please see the history of  present illness.     All other systems reviewed and are negative.  EKGs/Labs/Other Studies Reviewed:    The following studies were reviewed today:   EKG:  EKG is  ordered today.  The ekg ordered today is personally reviewed and demonstrates sinus bradycardia 51 bpm bifascicular heart block right bundle branch block left anterior hemiblock normal PR interval  Recent Labs: 11/20/2020: ALT 37; BUN 17; Creatinine, Ser 1.39; Potassium 4.5; Sodium  133 11/21/2020: B Natriuretic Peptide 237.5 11/22/2020: Hemoglobin 9.4; Magnesium 1.8; Platelets 159  Recent Lipid Panel    Component Value Date/Time   CHOL 113 11/21/2020 0216   TRIG 96 11/21/2020 0216   HDL 22 (L) 11/21/2020 0216   CHOLHDL 5.1 11/21/2020 0216   VLDL 19 11/21/2020 0216   LDLCALC 72 11/21/2020 0216    Physical Exam:    VS:  BP 94/62   Pulse (!) 51   Ht 6\' 1"  (1.854 m)   Wt (!) 302 lb (137 kg)   BMI 39.84 kg/m     Wt Readings from Last 3 Encounters:  12/17/20 (!) 302 lb (137 kg)  11/22/20 295 lb 13.7 oz (134.2 kg)  09/25/20 (!) 314 lb (142.4 kg)     GEN: Does not look acutely ill or weak well nourished, well developed in no acute distress HEENT: Normal NECK: No JVD; No carotid bruits LYMPHATICS: No lymphadenopathy CARDIAC: RRR, no murmurs, rubs, gallops RESPIRATORY:  Clear to auscultation without rales, wheezing or rhonchi  ABDOMEN: Soft, non-tender, non-distended MUSCULOSKELETAL:  No edema; No deformity  SKIN: Warm and dry NEUROLOGIC:  Alert and oriented x 3 PSYCHIATRIC:  Normal affect     Signed, Shirlee More, MD  12/17/2020 10:27 AM    [place]

## 2020-12-17 ENCOUNTER — Ambulatory Visit: Payer: PPO | Admitting: Cardiology

## 2020-12-17 ENCOUNTER — Other Ambulatory Visit: Payer: Self-pay

## 2020-12-17 ENCOUNTER — Encounter: Payer: Self-pay | Admitting: Cardiology

## 2020-12-17 VITALS — BP 94/62 | HR 51 | Ht 73.0 in | Wt 302.0 lb

## 2020-12-17 DIAGNOSIS — Z7901 Long term (current) use of anticoagulants: Secondary | ICD-10-CM | POA: Diagnosis not present

## 2020-12-17 DIAGNOSIS — I63411 Cerebral infarction due to embolism of right middle cerebral artery: Secondary | ICD-10-CM | POA: Diagnosis not present

## 2020-12-17 DIAGNOSIS — E782 Mixed hyperlipidemia: Secondary | ICD-10-CM

## 2020-12-17 DIAGNOSIS — G4730 Sleep apnea, unspecified: Secondary | ICD-10-CM | POA: Insufficient documentation

## 2020-12-17 DIAGNOSIS — I480 Paroxysmal atrial fibrillation: Secondary | ICD-10-CM | POA: Diagnosis not present

## 2020-12-17 DIAGNOSIS — I2510 Atherosclerotic heart disease of native coronary artery without angina pectoris: Secondary | ICD-10-CM

## 2020-12-17 DIAGNOSIS — Z9889 Other specified postprocedural states: Secondary | ICD-10-CM | POA: Insufficient documentation

## 2020-12-17 DIAGNOSIS — I4891 Unspecified atrial fibrillation: Secondary | ICD-10-CM | POA: Insufficient documentation

## 2020-12-17 DIAGNOSIS — D689 Coagulation defect, unspecified: Secondary | ICD-10-CM | POA: Insufficient documentation

## 2020-12-17 DIAGNOSIS — I10 Essential (primary) hypertension: Secondary | ICD-10-CM

## 2020-12-17 DIAGNOSIS — R112 Nausea with vomiting, unspecified: Secondary | ICD-10-CM | POA: Insufficient documentation

## 2020-12-17 DIAGNOSIS — M1990 Unspecified osteoarthritis, unspecified site: Secondary | ICD-10-CM | POA: Insufficient documentation

## 2020-12-17 DIAGNOSIS — H269 Unspecified cataract: Secondary | ICD-10-CM | POA: Insufficient documentation

## 2020-12-17 DIAGNOSIS — I454 Nonspecific intraventricular block: Secondary | ICD-10-CM | POA: Insufficient documentation

## 2020-12-17 NOTE — Patient Instructions (Signed)
Medication Instructions:  Your physician has recommended you make the following change in your medication:  STOP: Lisinopril  DECREASE: Metoprolol 12.5 mg take 1/2 tablet by mouth daily. Hold this medication if your blood pressure is less than 110 on top.  *If you need a refill on your cardiac medications before your next appointment, please call your pharmacy*   Lab Work: None If you have labs (blood work) drawn today and your tests are completely normal, you will receive your results only by: Marland Kitchen MyChart Message (if you have MyChart) OR . A paper copy in the mail If you have any lab test that is abnormal or we need to change your treatment, we will call you to review the results.   Testing/Procedures: None   Follow-Up: At [facility], you and your health needs are our priority.  As part of our continuing mission to provide you with exceptional heart care, we have created designated Provider Care Teams.  These Care Teams include your primary Cardiologist (physician) and Advanced Practice Providers (APPs -  Physician Assistants and Nurse Practitioners) who all work together to provide you with the care you need, when you need it.  We recommend signing up for the patient portal called "MyChart".  Sign up information is provided on this After Visit Summary.  MyChart is used to connect with patients for Virtual Visits (Telemedicine).  Patients are able to view lab/test results, encounter notes, upcoming appointments, etc.  Non-urgent messages can be sent to your provider as well.   To learn more about what you can do with MyChart, go to [email or web address].    Your next appointment:   6 week(s)  The format for your next appointment:   In Person  Provider:   Shirlee More, MD   Other Instructions Please take your blood pressure daily at home and keep a record of this. Drop these readings off at our office in two weeks.   We have put in the referral for you to go to PT at  [facility]. They will call you to schedule this.

## 2020-12-21 DIAGNOSIS — U071 COVID-19: Secondary | ICD-10-CM

## 2020-12-25 ENCOUNTER — Ambulatory Visit: Payer: PPO | Admitting: Adult Health

## 2020-12-25 ENCOUNTER — Encounter: Payer: Self-pay | Admitting: Adult Health

## 2020-12-25 VITALS — BP 150/83 | HR 52 | Ht 72.0 in | Wt 299.0 lb

## 2020-12-25 DIAGNOSIS — I639 Cerebral infarction, unspecified: Secondary | ICD-10-CM | POA: Diagnosis not present

## 2020-12-25 NOTE — Progress Notes (Signed)
[facility] [address] [place] [address] [phone]       HOSPITAL FOLLOW UP NOTE  [name] Date of Birth:  1950-12-27 Medical Record Number:  [id number]   Reason for Referral:  hospital stroke follow up    SUBJECTIVE:   CHIEF COMPLAINT:  Chief Complaint  Patient presents with  . 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  He received tPA with resolution of symptoms and transferred to [facility] ED for further evaluation.  Stroke work-up revealed small cortical acute to subacute infarct in the right temporal lobe with occlusion and right MCA distal M1/proximal M2 likely due to atrial fibrillation although possible hypercoagulable disease potential etiology.  Tested positive for COVID-19 infection with Covid hypercoagulability as well as episode of PAF during coronary angiography therefore recommended initiating Eliquis for long-term anticoagulation secondary stroke prevention.  Hypercoagulable labs pending at discharge with potential clotting disorder with family history (son passing from PE and daughter on [facility] for heterozygous thrombophilia).  Uncontrolled HTN requiring Cleviprex will long-term BP goal normotensive range.  LDL 72 and resumed atorvastatin 20 mg daily.  Other stroke risk factors include  OSA on CPAP, advanced age, current tobacco use, EtOH use, obesity, history of stroke, family history of stroke and CAD.  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   CTA head & neck showedhead and neck showed occlusion in right MCA distal M1/proximal M2  CT perfusion: 50 cc perfusion deficit involving the mid and posterior right MCAdistribution, in keeping with the previously identified right MCA  occlusion. No evidence for acute core infarct by CT perfusion.  MRI: Small cortical acute to subacute infarct of the right temporal lobe. No hemorrhage.  2D Echo  EF 55 to 60%, no evidence of intracardiac source of embolism, frequent PVCs noted  COVID-19 POSITIVE  LDL 72  HgbA1c 5.8  VTE prophylaxis is recommended.   Post tPA blood pressure: <180/110mm Hg for first 24 hours the gradual reduction over next few days.  On ASA 81 mg prior to admission and transitioned to Eliquis 5 mg twice daily for secondary stroke prevention  Therapy recommendations: none  Disposition: home  Today, 12/25/2020, [name] is being seen for hospital follow-up accompanied by his wife.  He has been doing well from a stroke standpoint without residual deficits and denies new stroke/TIA symptoms.  He continues to experience fatigue and occasional lightheadedness/dizziness but this was present prior to his stroke.  He has remained on Eliquis 5 mg twice daily without bleeding or bruising.  Remains on atorvastatin without myalgias.  Blood pressure today initially 150/83 and on recheck 132/60.  Recent BP med adjustments by cardiology due to  hypotension with patient reporting BP range 165-185/50 since that time.  They have follow-up next week with cardiology.  He reports ongoing compliance with CPAP for OSA  management followed by [facility] pulmonology.  No further concerns at this time.      ROS:   14 system review of systems performed and negative with exception of those listed in HPI  PMH:  Past Medical History:  Diagnosis Date  . A-fib (HCC)    hx of  . Abnormal myocardial perfusion study 07/14/2018  . Acute intermittent porphyria ([place]) 12/04/2020  . Acute upper respiratory infection 12/04/2020  . Allergic rhinitis 12/04/2020  . Arthritis    RIGHT knee  . BBB (bundle branch block)    hx of R BBB  . Benign prostatic hyperplasia with lower urinary tract symptoms 12/04/2020  . Binge eating disorder 12/04/2020  . CAD in native artery 06/22/2019  . Cataract    bilateral sx  . Chest discomfort 07/14/2018  . Chill 12/04/2020  . Clotting disorder [facility])    has a genetic clotting dx -unknown name  . Constipation 12/04/2020  . 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[place]) 12/04/2020  . Cutaneous abscess 12/04/2020  . Dyslipidemia 07/14/2018  . Episodic lightheadedness 10/14/2019  . Essential hypertension 07/14/2018  . Fever 12/04/2020  . Gastro-esophageal reflux disease without esophagitis 12/04/2020  . Generalized anxiety disorder 12/04/2020  . Hyperglycemia due to type 2 diabetes mellitus ([place]) 12/04/2020  . Hyperprolactinemia ([place]) 06/02/2017  . Hypertension    on meds  . Hypothyroidism 12/04/2020  . Iron deficiency 12/04/2020  . 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 Mixed hyperlipidemia 12/04/2020  . Moderate recurrent major depression ([place]) 12/04/2020  . Myopathy 12/04/2020  . Other long term (current) drug therapy 12/04/2020  . Other vitamin B12 deficiency anemias 12/04/2020  . Overactive bladder 12/04/2020  . Palpitations 10/14/2019  . Paroxysmal atrial fibrillation ([place]) 06/22/2019  . Peripheral vascular disease ([place]) 12/04/2020  . Pituitary microadenoma ([place]) 06/02/2017   Formatting of this note might be different from the original. 3 mm pituitary microadenoma. Likely  nonfunctional.  . PONV (postoperative nausea and vomiting)   . RBBB (right bundle branch block) 10/14/2019  . Recurrent major depression in full remission ([place]) 12/04/2020  . Shortness of breath 06/08/2019  . Sleep apnea    use CPAP  . Stroke (cerebrum) ([place]) 11/21/2020  . Testicular hypofunction 12/04/2020  . Vitamin D deficiency 12/04/2020  . Vomiting without nausea 12/04/2020    PSH:  Past Surgical History:  Procedure Laterality Date  . CATARACT EXTRACTION, BILATERAL    . CHOLECYSTECTOMY    . COLONOSCOPY  2017   RG-mag citrtae (good)-TICS/TA x 1  . FOOT SURGERY Left    tendon link  . HAMMER TOE SURGERY    . KNEE SURGERY Right   . LAPAROSCOPIC GASTRIC BANDING    . POLYPECTOMY  2017   TA x 1  . WISDOM TOOTH EXTRACTION    . WRIST RECONSTRUCTION Right     Social History:  Social History   Socioeconomic History  . Marital status: Married    Spouse name: Not on file  . Number of children: Not on file  . Years of education: Not on file  . Highest education level: Not on file  Occupational History  . Not on file  Tobacco Use  . Smoking status: Never Smoker  . Smokeless tobacco: Never Used  Vaping Use  . Vaping Use: Never used  Substance and Sexual Activity  .  Alcohol use: Never  . Drug use: Never  . Sexual activity: Not on file  Other Topics Concern  . Not on file  Social History Narrative  . Not on file   Social Determinants of Health   Financial Resource Strain: Not on file  Food Insecurity: Not on file  Transportation Needs: Not on file  Physical Activity: Not on file  Stress: Not on file  Social Connections: Not on file  Intimate Partner Violence: Not on file    Family History:  Family History  Problem Relation Age of Onset  . Colon cancer Mother 63  . Colon polyps Mother 56  . Thyroid cancer Father 2       parathyroid CA  . Colon polyps Sister   . Colon polyps Brother   . Colon polyps Sister   . Colon polyps Sister   . Colon polyps Sister   .  Esophageal cancer Neg Hx   . Stomach cancer Neg Hx   . Rectal cancer Neg Hx     Medications:   Current Outpatient Medications on File Prior to Visit  Medication Sig Dispense Refill  . albuterol (VENTOLIN HFA) 108 (90 Base) MCG/ACT inhaler Inhale 1 puff into the lungs every 4 (four) hours as needed for wheezing or shortness of breath.    Marland Kitchen apixaban (ELIQUIS) 5 MG TABS tablet Take 5 mg by mouth 2 (two) times daily.    Marland Kitchen atorvastatin (LIPITOR) 20 MG tablet Take 20 mg by mouth at bedtime.    Marland Kitchen buPROPion (WELLBUTRIN) 75 MG tablet Take 75 mg by mouth at bedtime.  0  . Cholecalciferol (VITAMIN D3) 125 MCG (5000 UT) CAPS Take 5,000 Units by mouth at bedtime.    . cyanocobalamin (,VITAMIN B-12,) 1000 MCG/ML injection Inject 1,000 mg into the muscle every 30 (thirty) days.    . ferrous sulfate 324 (65 Fe) MG TBEC Take 65 mg by mouth daily after supper.    Marland Kitchen FLUoxetine (PROZAC) 40 MG capsule Take 40 mg by mouth at bedtime.    . folic acid (FOLVITE) 585 MCG tablet Take 400 mcg by mouth at bedtime.    Marland Kitchen guaiFENesin (MUCINEX) 600 MG 12 hr tablet Take 1 tablet (600 mg total) by mouth 2 (two) times daily. As needed for  congestion 28 tablet 0  . metoprolol succinate (TOPROL-XL) 25 MG 24 hr tablet Take 12.5 mg by mouth at bedtime.    . tamsulosin (FLOMAX) 0.4 MG CAPS capsule Take 0.4 mg by mouth at bedtime.    Marland Kitchen testosterone cypionate (DEPOTESTOTERONE CYPIONATE) 100 MG/ML injection Inject 100 mg into the muscle every Thursday. For IM use only    . tolterodine (DETROL LA) 4 MG 24 hr capsule Take 4 mg by mouth at bedtime.  1   No current facility-administered medications on file prior to visit.    Allergies:   Allergies  Allergen Reactions  . Nsaids Other (See Comments)    Patient had a Lap band placed is suppose to have NO NSAIDS due to the possibility of esophageal erosion leading to band "coming through"      OBJECTIVE:  Physical Exam  Vitals:   12/25/20 1003  BP: (!) 150/83  Pham: (!) 52   Weight: 299 lb (135.6 kg)  Height: 6' (1.829 m)   Body mass index is 40.55 kg/m. No exam data present  General: well developed, well nourished,  very pleasant middle-age Caucasian seated, in no evident distress Head: head normocephalic and atraumatic.   Neck: supple with  no carotid or supraclavicular bruits Cardiovascular: regular rate and rhythm, no murmurs Musculoskeletal: no deformity Skin:  no rash/petichiae Vascular:  Normal pulses all extremities   Neurologic Exam Mental Status: Awake and fully alert.   Fluent speech and language.  Oriented to place and time. Recent and remote memory intact. Attention span, concentration and fund of knowledge appropriate. Mood and affect appropriate.  Cranial Nerves: Fundoscopic exam reveals sharp disc margins. Pupils equal, briskly reactive to light. Extraocular movements full without nystagmus. Visual fields full to confrontation. Hearing intact. Facial sensation intact. Face, tongue, palate moves normally and symmetrically.  Motor: Normal bulk and tone. Normal strength in all tested extremity muscles Sensory.: intact to touch , pinprick , position and vibratory sensation.  Coordination: Rapid alternating movements normal in all extremities. Finger-to-nose and heel-to-shin performed accurately bilaterally. Gait and Station: Arises from chair without difficulty. Stance is normal. Gait demonstrates normal stride length and balance Reflexes: 1+ and symmetric. Toes downgoing.     NIHSS  0 Modified Rankin  0      ASSESSMENT: YASSINE BRUNSMAN is a 70 y.o. year old male presented with a facial droop, left arm weakness with neglect and difficulty speaking on 11/20/2020 with stroke work-up revealing cortical acute to subacute right temporal lobe infarct with right MCA distal M1/proximal M2 occlusion likely due to atrial fibrillation (one witnessed event 2020 during procedure, possible hypercoagulable disease and COVID-19 hypercoagulability. Vascular  risk factors include HTN, HLD, tobacco use, OSA on CPAP, CAD, RBBB, LAFB, PAF presumed atrial fibrillation and possible genetic clotting disorder.      PLAN:  1. R temporal stroke :  a. Residual deficit: Recovered well without residual deficit.  Does complain of generalized fatigue with lightheadedness which was being evaluated PTA and did not worsen post stroke b. Continue Eliquis (apixaban) daily  and atorvastatin for secondary stroke prevention.   c. Factor V Leiden negative d. Discussed secondary stroke prevention measures and importance of close PCP follow up for aggressive stroke risk factor management  2. Atrial fibrillation: Episode of PAF during coronary angiography 06/2019.  Concern recent stroke possibly due to atrial fibrillation therefore Eliquis initiated.  Recommend continuation of Eliquis prescribed and managed by cardiology 3. HTN: BP goal <130/90.  Stable today on recheck with episodes of hypotension and now hypertension -currently being further evaluated by cardiology 4. HLD: LDL goal <70. Recent LDL 72 on atorvastatin 20 mg daily. 5. OSA on CPAP: Encouraged continued nightly use for secondary stroke prevention   Follow up in 3 months or call earlier if needed   CC:  [facility] provider: Dr. Legrand Rams, Rosalyn Charters, MD    I spent 45 minutes of face-to-face and non-face-to-face time with patient and wife.  This included previsit chart review including recent hospitalization pertinent progress notes, lab work and imaging, lab review, study review, order entry, electronic health record documentation, patient education regarding recent stroke and etiology,  importance of managing stroke risk factors and answered all other questions to patient and wife's satisfaction  Frann Rider, AGNP-BC  [facility] [address] [place], [place] [address]  Phone [phone] Fax [phone] Note: This document was prepared with digital dictation and possible  smart phrase technology. Any transcriptional errors that result from this process are unintentional.

## 2020-12-25 NOTE — Patient Instructions (Signed)
Continue to follow with Dr. Bettina Gavia for blood pressure monitoring and management  Continue Eliquis (apixaban) daily  and atorvastatin for secondary stroke prevention  Continue to follow up with PCP regarding cholesterol and blood pressure management  Maintain strict control of hypertension with blood pressure goal below 130/90, diabetes with hemoglobin A1c goal below 6.5% and cholesterol with LDL cholesterol (bad cholesterol) goal below 70 mg/dL.       Followup in the future with me in 3 months or call earlier if needed       Thank you for coming to see [place] at [facility]. I hope we have been able to provide you high quality care today.  You may receive a patient satisfaction survey over the next few weeks. We would appreciate your feedback and comments so that we may continue to improve ourselves and the health of our patients.    Preventing Atrial Fibrillation-Related Stroke Atrial fibrillation (AFib) is a common type of irregular or rapid heartbeat (arrhythmia) that increases the risk for a stroke. In AFib, the top portions of the heart (atria) beat out of sync with the lower portions of the heart. When the muscles of the atria tighten in an uncoordinated way (fibrillating), blood can pool in the heart and form clots. A stroke can be caused by a blood clot that travels to the brain. This type of stroke is preventable. Understanding AFib and knowing how to properly manage it can prevent a stroke from happening. How can this condition affect me? Having AFib can increase your risk for a stroke. A stroke is a medical emergency. It can lead to brain damage and can sometimes be life-threatening. A stroke can be a life-changing event. What can increase my risk? Having AFib can increase your risk of stroke. Other risk factors include:  Having heart failure.  Having high blood pressure.  Being older than age 73.  Having diabetes.  Having a history of vascular  disease, such as heart attack or stroke.  Being male.  Having a history of transient ischemic attacks (TIAs). This is sometimes called a ministroke.  Having a family history of stroke. Risk factors that you can change include:  Smoking.  High cholesterol.  Being inactive (sedentary lifestyle).  Eating a diet that is high in fat, cholesterol, and salt. What actions can I take to prevent this?  Maintain a healthy weight.  Get regular exercise. This can include at least 40 minutes of moderate exercise on most days, such as walking at a fast pace, or vigorous exercise, such as jogging.  Get evaluated for obstructive sleep apnea. Talk to your health care provider about getting a sleep evaluation if you snore a lot or have excessive sleepiness.  Manage any other medical conditions you have, such as hypertension or diabetes. Medicines  Take over-the-counter and prescription medicines only as told by your health care provider.  If your health care provider prescribed an anticoagulant, take it exactly as told. Taking too much blood-thinning medicine can cause bleeding. Taking too little may not give you the protection that you need against stroke and other problems. Eating and drinking  Eat healthy foods, including at least 5 servings of fruits and vegetables a day.  Do not drink alcohol.  Do not drink beverages that have caffeine, such as coffee, soda, and tea.  Follow instructions about your diet as told by your health care provider. Questions to ask your health care provider Contact a health care provider if:  You notice a  change in the rate, rhythm, or strength of your heartbeat.  You feel dizzy.  You are taking an anticoagulant and you have more bruises than usual.  You get tired more easily when you exercise or do similar activities. Get help right away if:  You have chest pain.  You have trouble breathing.  You have pain in your abdomen.  You experience  unusual sweating or weakness.  You take anticoagulants and you: ? Have severe headaches or confusion. ? Have blood in your vomit, bowel movement, or urine. ? Have bleeding that will not stop. ? Fall or injure your head.  You have any symptoms of a stroke. "BE FAST" is an easy way to remember the main warning signs of a stroke: ? B - Balance. Signs are dizziness, trouble walking, or loss of balance. ? E - Eyes. Signs are trouble seeing or a sudden change in vision. ? F - Face. Signs are sudden weakness or numbness of the face, or the face or eyelid drooping on one side. ? A - Arms. Signs are weakness or numbness in an arm. This happens suddenly and usually on one side of the body. ? S - Speech. Signs are sudden trouble speaking, slurred speech, or trouble understanding what people say. ? T - Time. Time to call emergency services. Write down what time symptoms started.  You have other signs of a stroke, such as: ? A sudden, severe headache with no known cause. ? Nausea or vomiting. ? Seizure. These symptoms may represent a serious problem that is an emergency. Do not wait to see if the symptoms will go away. Get medical help right away. Call your local emergency services (911 in the [place] Do not drive yourself to the hospital.   Summary  Having atrial fibrillation (AFib) can increase the risk for a stroke. Talk with your health care provider about what symptoms to watch for.  AFib-related stroke is preventable. Proper management of AFib can prevent you from having a stroke.  Talk with your health care provider about whether anticoagulant medicine is right for you.  Learn the warning signs of a stroke and remember "BE FAST." This information is not intended to replace advice given to you by your health care provider. Make sure you discuss any questions you have with your health care provider. Document Revised: 06/28/2020 Document Reviewed: 06/28/2020 Elsevier Patient Education  [place].

## 2020-12-26 ENCOUNTER — Ambulatory Visit: Payer: PPO | Admitting: Cardiology

## 2020-12-26 NOTE — Progress Notes (Signed)
I agree with the above plan 

## 2020-12-27 DIAGNOSIS — E559 Vitamin D deficiency, unspecified: Secondary | ICD-10-CM | POA: Diagnosis not present

## 2020-12-27 DIAGNOSIS — I25118 Atherosclerotic heart disease of native coronary artery with other forms of angina pectoris: Secondary | ICD-10-CM | POA: Diagnosis not present

## 2020-12-27 DIAGNOSIS — E1165 Type 2 diabetes mellitus with hyperglycemia: Secondary | ICD-10-CM | POA: Diagnosis not present

## 2020-12-27 DIAGNOSIS — F331 Major depressive disorder, recurrent, moderate: Secondary | ICD-10-CM | POA: Diagnosis not present

## 2020-12-27 DIAGNOSIS — D518 Other vitamin B12 deficiency anemias: Secondary | ICD-10-CM | POA: Diagnosis not present

## 2020-12-27 DIAGNOSIS — F3342 Major depressive disorder, recurrent, in full remission: Secondary | ICD-10-CM | POA: Diagnosis not present

## 2020-12-27 DIAGNOSIS — N401 Enlarged prostate with lower urinary tract symptoms: Secondary | ICD-10-CM | POA: Diagnosis not present

## 2020-12-27 DIAGNOSIS — E782 Mixed hyperlipidemia: Secondary | ICD-10-CM | POA: Diagnosis not present

## 2020-12-27 DIAGNOSIS — I10 Essential (primary) hypertension: Secondary | ICD-10-CM | POA: Diagnosis not present

## 2020-12-27 DIAGNOSIS — E038 Other specified hypothyroidism: Secondary | ICD-10-CM | POA: Diagnosis not present

## 2020-12-28 ENCOUNTER — Other Ambulatory Visit: Payer: Self-pay | Admitting: *Deleted

## 2020-12-28 NOTE — Patient Outreach (Signed)
[facility] [facility]) Care Management  12/28/2020  Lance Pham Sep 16, 1951 [id number]   Call placed to member to follow up on ongoing stroke recovery.  State he is doing well, was seen by both cardiology and neurology over the past couple weeks, denies any concerns.  Will close case at this time, advised to contact this care manager with questions.  Valente David, [place], MSN [place] [facility] [place] [phone]

## 2020-12-31 DIAGNOSIS — E559 Vitamin D deficiency, unspecified: Secondary | ICD-10-CM | POA: Diagnosis not present

## 2020-12-31 DIAGNOSIS — F411 Generalized anxiety disorder: Secondary | ICD-10-CM | POA: Diagnosis not present

## 2020-12-31 DIAGNOSIS — I10 Essential (primary) hypertension: Secondary | ICD-10-CM | POA: Diagnosis not present

## 2020-12-31 DIAGNOSIS — E782 Mixed hyperlipidemia: Secondary | ICD-10-CM | POA: Diagnosis not present

## 2020-12-31 DIAGNOSIS — Z8673 Personal history of transient ischemic attack (TIA), and cerebral infarction without residual deficits: Secondary | ICD-10-CM | POA: Diagnosis not present

## 2020-12-31 DIAGNOSIS — N401 Enlarged prostate with lower urinary tract symptoms: Secondary | ICD-10-CM | POA: Diagnosis not present

## 2020-12-31 DIAGNOSIS — E291 Testicular hypofunction: Secondary | ICD-10-CM | POA: Diagnosis not present

## 2020-12-31 DIAGNOSIS — F331 Major depressive disorder, recurrent, moderate: Secondary | ICD-10-CM | POA: Diagnosis not present

## 2020-12-31 DIAGNOSIS — Z79899 Other long term (current) drug therapy: Secondary | ICD-10-CM | POA: Diagnosis not present

## 2020-12-31 DIAGNOSIS — D518 Other vitamin B12 deficiency anemias: Secondary | ICD-10-CM | POA: Diagnosis not present

## 2020-12-31 DIAGNOSIS — N3281 Overactive bladder: Secondary | ICD-10-CM | POA: Diagnosis not present

## 2021-01-29 ENCOUNTER — Encounter: Payer: Self-pay | Admitting: Cardiology

## 2021-01-29 ENCOUNTER — Ambulatory Visit: Payer: PPO | Admitting: Cardiology

## 2021-01-29 ENCOUNTER — Other Ambulatory Visit: Payer: Self-pay

## 2021-01-29 VITALS — BP 158/80 | HR 50 | Ht 72.0 in | Wt 302.4 lb

## 2021-01-29 DIAGNOSIS — I10 Essential (primary) hypertension: Secondary | ICD-10-CM

## 2021-01-29 DIAGNOSIS — I952 Hypotension due to drugs: Secondary | ICD-10-CM

## 2021-01-29 DIAGNOSIS — I480 Paroxysmal atrial fibrillation: Secondary | ICD-10-CM | POA: Diagnosis not present

## 2021-01-29 DIAGNOSIS — Z7901 Long term (current) use of anticoagulants: Secondary | ICD-10-CM

## 2021-01-29 MED ORDER — LOSARTAN POTASSIUM 50 MG PO TABS: 50.0000 mg | ORAL_TABLET | Freq: Every day | ORAL | 3 refills | Status: DC

## 2021-01-29 NOTE — Progress Notes (Signed)
Cardiology Office Note:    Date:  01/29/2021   ID:  Lance Pham, DOB 02/24/1951, MRN [id number]  PCP:  Bonnita Nasuti, MD  Cardiologist:  Shirlee More, MD    Referring MD: Bonnita Nasuti, MD    ASSESSMENT:    1. Hypotension due to drugs   2. Essential hypertension   3. Paroxysmal atrial fibrillation (HCC)   4. Chronic anticoagulation    PLAN:    In order of problems listed above:  1. Improved no longer is symptomatic hypotension, initially had variable blood pressures on him and is put on a low-dose of ARB along with his beta-blocker and get him back to checking blood pressure daily goal systolic in the range of 938 diastolic 80 or less. 2. Stable no recurrent clinical atrial fibrillation we will continue long-term anticoagulation with embolic stroke.   Next appointment: 6 months   Medication Adjustments/Labs and Tests Ordered: Current medicines are reviewed at length with the patient today.  Concerns regarding medicines are outlined above.  No orders of the defined types were placed in this encounter.  No orders of the defined types were placed in this encounter.   Chief Complaint  Patient presents with  . Follow-up  . Hypertension  . Atrial Fibrillation  . Anticoagulation    History of Present Illness:    Lance Pham is a 70 y.o. male with a hx of paroxysmal atrial fibrillation with stroke and chronic anticoagulation, CAD hypertension and hyperlipidemia.  He was last seen 12/17/2020.  If unable to continue long-term anticoagulation I would advise a watchman device for left atrial appendage occlusion.  At his last visit he had symptomatic hypotension, blood pressure was reported 90 to 100 mmHg and his ACE inhibitor was discontinued.  His EKG showed bifascicular heart block.. Seen in follow-up by neurology 12/25/2020 and was felt to be progressing well from his stroke without a residual defect. Compliance with diet, lifestyle and medications: Yes  He trended  his blood pressure perhaps 2 weeks after his last visit and was in the range of 1 10-1 60 systolic and is symptomatic hypotension resolved. Since then he is not checking his blood pressure today repeat 140/82. I reviewed the neurology note I will put him on a low-dose of a low intensity ARB which should drop his systolic 7-51 and put him in range and have asked him to get back to checking blood pressure daily recording and gave tips how to accurately check his blood pressure He is much improved his thought process is normal he perceives no residual from his stroke No palpitations syncope no bleeding from his anticoagulant or chest pain.  Chart review: He was seen [facility]/04/2020 with a history of CAD distal LAD disease not felt to be amenable to PCI paroxysmal atrial fibrillation hypertension dyslipidemia right bundle branch block and left anterior hemiblock.   Left heart cath 06/22/2019 will be copied and placed in the record for completeness:  Angiographic findings   Cardiac Arteries and Lesion Findings  LMCA: Normal appearance with 0% stenosis.  LAD: The LAD is a large artery that wrap around the apex It is free of disease  all the way down to the apex After wrapping around the apex, there is a  significant stenosis. The vessel at this level is probably only 1 mm in  diameter. Very unlikely will produce any symptoms It is a very small erritory  involved.  LCx: Normal appearance with 0% stenosis.  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  He was treated with TPA with functional improvement for stroke.  Discharged with Eliquis anticoagulation.  He also had COVID-19 infection associated with his embolic  stroke. Echocardiogram performed 11/23/2019 showed mild concentric LVH normal size ejection fraction normal 55 to 60% he had moderate biatrial enlargement and no significant valvular abnormality.  Contrast injection was not performed. EKG 11/21/2020 showed sinus rhythm PR interval normal right   Past Medical History:  Diagnosis Date  . A-fib (HCC)    hx of  . Abnormal myocardial perfusion study 07/14/2018  . Acute intermittent porphyria ([place]) 12/04/2020  . Acute upper respiratory infection 12/04/2020  . Allergic rhinitis 12/04/2020  . Arthritis    RIGHT knee  . BBB (bundle branch block)    hx of R BBB  . Benign prostatic hyperplasia with lower urinary tract symptoms 12/04/2020  . Binge eating disorder 12/04/2020  . CAD in native artery 06/22/2019  . Cataract    bilateral sx  . Chest discomfort 07/14/2018  . Chill 12/04/2020  . Clotting disorder [facility])    has a genetic clotting dx -unknown name  . Constipation 12/04/2020  . 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[place]) 12/04/2020  . Cutaneous abscess 12/04/2020  . Dyslipidemia 07/14/2018  . Episodic lightheadedness 10/14/2019  . Essential hypertension 07/14/2018  . Fever 12/04/2020  . Gastro-esophageal reflux disease without esophagitis 12/04/2020  . Generalized anxiety disorder 12/04/2020  . Hyperglycemia due to type 2 diabetes mellitus ([place]) 12/04/2020  . Hyperprolactinemia ([place]) 06/02/2017  . Hypertension    on meds  . Hypothyroidism 12/04/2020  . Iron deficiency 12/04/2020  . 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 Mixed hyperlipidemia 12/04/2020  . Moderate recurrent major depression ([place]) 12/04/2020  . Myopathy 12/04/2020  . Other long term (current) drug therapy 12/04/2020  . Other vitamin B12 deficiency anemias 12/04/2020  . Overactive bladder 12/04/2020  . Palpitations 10/14/2019  . Paroxysmal atrial fibrillation ([place]) 06/22/2019  . Peripheral vascular disease ([place]) 12/04/2020  . Pituitary microadenoma ([place]) 06/02/2017    Formatting of this note might be different from the original. 3 mm pituitary microadenoma. Likely nonfunctional.  . PONV (postoperative nausea and vomiting)   . RBBB (right bundle branch block) 10/14/2019  . Recurrent major depression in full remission ([place]) 12/04/2020  . Shortness of breath 06/08/2019  . Sleep apnea    use CPAP  . Stroke (cerebrum) ([place]) 11/21/2020  . Testicular hypofunction 12/04/2020  . Vitamin D deficiency 12/04/2020  . Vomiting without nausea 12/04/2020    Past Surgical History:  Procedure Laterality Date  . CATARACT EXTRACTION, BILATERAL    . CHOLECYSTECTOMY    . COLONOSCOPY  2017   RG-mag citrtae (good)-TICS/TA x 1  . FOOT SURGERY Left    tendon link  . HAMMER TOE SURGERY    . KNEE SURGERY Right   . LAPAROSCOPIC GASTRIC BANDING    . POLYPECTOMY  2017   TA x 1  . WISDOM TOOTH EXTRACTION    . WRIST RECONSTRUCTION Right     Current Medications: Current Meds  Medication Sig  . albuterol (VENTOLIN HFA) 108 (90 Base) MCG/ACT inhaler Inhale 1 puff into the lungs every 4 (four) hours as needed for wheezing or shortness of  breath.  Marland Kitchen apixaban (ELIQUIS) 5 MG TABS tablet Take 5 mg by mouth 2 (two) times daily.  Marland Kitchen atorvastatin (LIPITOR) 20 MG tablet Take 20 mg by mouth at bedtime.  Marland Kitchen buPROPion (WELLBUTRIN) 75 MG tablet Take 75 mg by mouth at bedtime.  . Cholecalciferol (VITAMIN D3) 125 MCG (5000 UT) CAPS Take 5,000 Units by mouth at bedtime.  . cyanocobalamin (,VITAMIN B-12,) 1000 MCG/ML injection Inject 1,000 mg into the muscle every 30 (thirty) days.  . ferrous sulfate 324 (65 Fe) MG TBEC Take 65 mg by mouth daily after supper.  Marland Kitchen FLUoxetine (PROZAC) 40 MG capsule Take 40 mg by mouth at bedtime.  . folic acid (FOLVITE) 161 MCG tablet Take 400 mcg by mouth at bedtime.  Marland Kitchen guaiFENesin (MUCINEX) 600 MG 12 hr tablet Take 1 tablet (600 mg total) by mouth 2 (two) times daily. As needed for  congestion  . metoprolol succinate (TOPROL-XL) 25 MG 24 hr tablet Take 12.5 mg by mouth  at bedtime.  . tamsulosin (FLOMAX) 0.4 MG CAPS capsule Take 0.4 mg by mouth at bedtime.  Marland Kitchen testosterone cypionate (DEPOTESTOTERONE CYPIONATE) 100 MG/ML injection Inject 100 mg into the muscle every Thursday. For IM use only  . tolterodine (DETROL LA) 4 MG 24 hr capsule Take 4 mg by mouth at bedtime.     Allergies:   Nsaids   Social History   Socioeconomic History  . Marital status: Married    Spouse name: Not on file  . Number of children: Not on file  . Years of education: Not on file  . Highest education level: Not on file  Occupational History  . Not on file  Tobacco Use  . Smoking status: Never Smoker  . Smokeless tobacco: Never Used  Vaping Use  . Vaping Use: Never used  Substance and Sexual Activity  . Alcohol use: Never  . Drug use: Never  . Sexual activity: Not on file  Other Topics Concern  . Not on file  Social History Narrative  . Not on file   Social Determinants of Health   Financial Resource Strain: Not on file  Food Insecurity: Not on file  Transportation Needs: Not on file  Physical Activity: Not on file  Stress: Not on file  Social Connections: Not on file     Family History: The patient's family history includes Colon cancer (age of onset: 2) in his mother; Colon polyps in his brother, sister, sister, sister, and sister; Colon polyps (age of onset: 7) in his mother; Thyroid cancer (age of onset: 46) in his father. There is no history of Esophageal cancer, Stomach cancer, or Rectal cancer. ROS:   Please see the history of present illness.    All other systems reviewed and are negative.  EKGs/Labs/Other Studies Reviewed:    The following studies were reviewed today:  Recent Labs: 11/20/2020: ALT 37; BUN 17; Creatinine, Ser 1.39; Potassium 4.5; Sodium 133 11/21/2020: B Natriuretic Peptide 237.5 11/22/2020: Hemoglobin 9.4; Magnesium 1.8; Platelets 159  Recent Lipid Panel    Component Value Date/Time   CHOL 113 11/21/2020 0216   TRIG 96  11/21/2020 0216   HDL 22 (L) 11/21/2020 0216   CHOLHDL 5.1 11/21/2020 0216   VLDL 19 11/21/2020 0216   LDLCALC 72 11/21/2020 0216    Physical Exam:    VS:  BP (!) 158/80   Pulse (!) 50   Ht 6' (1.829 m)   Wt (!) 302 lb 6.4 oz (137.2 kg)   SpO2 99%  BMI 41.01 kg/m     Wt Readings from Last 3 Encounters:  01/29/21 (!) 302 lb 6.4 oz (137.2 kg)  12/25/20 299 lb (135.6 kg)  12/17/20 (!) 302 lb (137 kg)     GEN:  Well nourished, well developed in no acute distress HEENT: Normal NECK: No JVD; No carotid bruits LYMPHATICS: No lymphadenopathy CARDIAC: RRR, no murmurs, rubs, gallops RESPIRATORY:  Clear to auscultation without rales, wheezing or rhonchi  ABDOMEN: Soft, non-tender, non-distended MUSCULOSKELETAL:  No edema; No deformity  SKIN: Warm and dry NEUROLOGIC:  Alert and oriented x 3 PSYCHIATRIC:  Normal affect    Signed, Shirlee More, MD  01/29/2021 8:46 AM    [place]

## 2021-01-29 NOTE — Patient Instructions (Addendum)
Medication Instructions:  Your physician has recommended you make the following change in your medication:  START: Cozaar 50 mg take one tablet by mouth daily.  *If you need a refill on your cardiac medications before your next appointment, please call your pharmacy*   Lab Work: None If you have labs (blood work) drawn today and your tests are completely normal, you will receive your results only by: Marland Kitchen MyChart Message (if you have MyChart) OR . A paper copy in the mail If you have any lab test that is abnormal or we need to change your treatment, we will call you to review the results.   Testing/Procedures: None   Follow-Up: At [facility], you and your health needs are our priority.  As part of our continuing mission to provide you with exceptional heart care, we have created designated Provider Care Teams.  These Care Teams include your primary Cardiologist (physician) and Advanced Practice Providers (APPs -  Physician Assistants and Nurse Practitioners) who all work together to provide you with the care you need, when you need it.  We recommend signing up for the patient portal called "MyChart".  Sign up information is provided on this After Visit Summary.  MyChart is used to connect with patients for Virtual Visits (Telemedicine).  Patients are able to view lab/test results, encounter notes, upcoming appointments, etc.  Non-urgent messages can be sent to your provider as well.   To learn more about what you can do with MyChart, go to [email or web address].    Your next appointment:   6 month(s)  The format for your next appointment:   In Person  Provider:   Shirlee More, MD   Other Instructions  Tips to measure your blood pressure correctly  To determine whether you have hypertension, a medical professional will take a blood pressure reading. How you prepare for the test, the position of your arm, and other factors can change a blood pressure reading by 10% or more.  That could be enough to hide high blood pressure, start you on a drug you don't really need, or lead your doctor to incorrectly adjust your medications. National and international guidelines offer specific instructions for measuring blood pressure. If a doctor, nurse, or medical assistant isn't doing it right, don't hesitate to ask him or her to get with the guidelines. Here's what you can do to ensure a correct reading: . Don't drink a caffeinated beverage or smoke during the 30 minutes before the test. . Sit quietly for five minutes before the test begins. . During the measurement, sit in a chair with your feet on the floor and your arm supported so your elbow is at about heart level. . The inflatable part of the cuff should completely cover at least 80% of your upper arm, and the cuff should be placed on bare skin, not over a shirt. . Don't talk during the measurement. . Have your blood pressure measured twice, with a brief break in between. If the readings are different by 5 points or more, have it done a third time. There are times to break these rules. If you sometimes feel lightheaded when getting out of bed in the morning or when you stand after sitting, you should have your blood pressure checked while seated and then while standing to see if it falls from one position to the next. Because blood pressure varies throughout the day, your doctor will rarely diagnose hypertension on the basis of a single reading. Instead, he or she  will want to confirm the measurements on at least two occasions, usually within a few weeks of one another. The exception to this rule is if you have a blood pressure reading of 180/110 mm Hg or higher. A result this high usually calls for prompt treatment. It's also a good idea to have your blood pressure measured in both arms at least once, since the reading in one arm (usually the right) may be higher than that in the left. A 2014 study in The American Journal of  Medicine of nearly 3,400 people found average arm- to-arm differences in systolic blood pressure of about 5 points. The higher number should be used to make treatment decisions. In 2017, new guidelines from the [place], the [employer], and nine other health organizations lowered the diagnosis of high blood pressure to 130/80 mm Hg or higher for all adults. The guidelines also redefined the various blood pressure categories to now include normal, elevated, Stage 1 hypertension, Stage 2 hypertension, and hypertensive crisis (see "Blood pressure categories"). Blood pressure categories  Blood pressure category SYSTOLIC (upper number)  DIASTOLIC (lower number)  Normal Less than 120 mm Hg and Less than 80 mm Hg  Elevated 120-129 mm Hg and Less than 80 mm Hg  High blood pressure: Stage 1 hypertension 130-139 mm Hg or 80-89 mm Hg  High blood pressure: Stage 2 hypertension 140 mm Hg or higher or 90 mm Hg or higher  Hypertensive crisis (consult your doctor immediately) Higher than 180 mm Hg and/or Higher than 120 mm Hg  Source: American Heart Association and American Stroke Association. For more on getting your blood pressure under control, buy Controlling Your Blood Pressure, a Special Health Report from [facility].DASH diet: Healthy eating to lower your blood pressure The DASH diet emphasizes portion size, eating a variety of foods and getting the right amount of nutrients. Discover how DASH can improve your health and lower your blood pressure. By [facility] Staff  DASH stands for Dietary Approaches to Stop Hypertension. The DASH diet is a lifelong approach to healthy eating that's designed to help treat or prevent high blood pressure (hypertension). The DASH diet encourages you to reduce the sodium in your diet and eat a variety of foods rich in nutrients that help lower blood pressure, such as potassium, calcium and magnesium. By following the DASH diet,  you may be able to reduce your blood pressure by a few points in just two weeks. Over time, your systolic blood pressure could drop by eight to 14 points, which can make a significant difference in your health risks. Because the DASH diet is a healthy way of eating, it offers health benefits besides just lowering blood pressure. The DASH diet is also in line with dietary recommendations to prevent osteoporosis, cancer, heart disease, stroke and diabetes. DASH diet: Sodium levels The DASH diet emphasizes vegetables, fruits and low-fat dairy foods -- and moderate amounts of whole grains, fish, poultry and nuts. In addition to the standard DASH diet, there is also a lower sodium version of the diet. You can choose the version of the diet that meets your health needs: Standard DASH diet. You can consume up to 2,300 milligrams (mg) of sodium a day.  Lower sodium DASH diet. You can consume up to 1,500 mg of sodium a day. Both versions of the DASH diet aim to reduce the amount of sodium in your diet compared with what you might get in a typical American diet,  which can amount to a whopping 3,400 mg of sodium a day or more. The standard DASH diet meets the recommendation from the Dietary Guidelines for Americans to keep daily sodium intake to less than 2,300 mg a day. The American Heart Association recommends 1,500 mg a day of sodium as an upper limit for all adults. If you aren't sure what sodium level is right for you, talk to your doctor. DASH diet: What to eat Both versions of the DASH diet include lots of whole grains, fruits, vegetables and low-fat dairy products. The DASH diet also includes some fish, poultry and legumes, and encourages a small amount of nuts and seeds a few times a week.  You can eat red meat, sweets and fats in small amounts. The DASH diet is low in saturated fat, cholesterol and total fat. Here's a look at the recommended servings from each food group for the 2,000-calorie-a-day DASH  diet. Grains: 6 to 8 servings a day Grains include bread, cereal, rice and pasta. Examples of one serving of grains include 1 slice whole-wheat bread, 1 ounce dry cereal, or 1/2 cup cooked cereal, rice or pasta. Focus on whole grains because they have more fiber and nutrients than do refined grains. For instance, use brown rice instead of white rice, whole-wheat pasta instead of regular pasta and whole-grain bread instead of white bread. Look for products labeled "100 percent whole grain" or "100 percent whole wheat."  Grains are naturally low in fat. Keep them this way by avoiding butter, cream and cheese sauces. Vegetables: 4 to 5 servings a day Tomatoes, carrots, broccoli, sweet potatoes, greens and other vegetables are full of fiber, vitamins, and such minerals as potassium and magnesium. Examples of one serving include 1 cup raw leafy green vegetables or 1/2 cup cut-up raw or cooked vegetables. Don't think of vegetables only as side dishes -- a hearty blend of vegetables served over brown rice or whole-wheat noodles can serve as the main dish for a meal.  Fresh and frozen vegetables are both good choices. When buying frozen and canned vegetables, choose those labeled as low sodium or without added salt.  To increase the number of servings you fit in daily, be creative. In a stir-fry, for instance, cut the amount of meat in half and double up on the vegetables. Fruits: 4 to 5 servings a day Many fruits need little preparation to become a healthy part of a meal or snack. Like vegetables, they're packed with fiber, potassium and magnesium and are typically low in fat -- coconuts are an exception. Examples of one serving include one medium fruit, 1/2 cup fresh, frozen or canned fruit, or 4 ounces of juice. Have a piece of fruit with meals and one as a snack, then round out your day with a dessert of fresh fruits topped with a dollop of low-fat yogurt.  Leave on edible peels whenever possible. The peels  of apples, pears and most fruits with pits add interesting texture to recipes and contain healthy nutrients and fiber.  Remember that citrus fruits and juices, such as grapefruit, can interact with certain medications, so check with your doctor or pharmacist to see if they're OK for you.  If you choose canned fruit or juice, make sure no sugar is added. Dairy: 2 to 3 servings a day Milk, yogurt, cheese and other dairy products are major sources of calcium, vitamin D and protein. But the key is to make sure that you choose dairy products that are low fat  or fat-free because otherwise they can be a major source of fat -- and most of it is saturated. Examples of one serving include 1 cup skim or 1 percent milk, 1 cup low fat yogurt, or 1 1/2 ounces part-skim cheese. Low-fat or fat-free frozen yogurt can help you boost the amount of dairy products you eat while offering a sweet treat. Add fruit for a healthy twist.  If you have trouble digesting dairy products, choose lactose-free products or consider taking an over-the-counter product that contains the enzyme lactase, which can reduce or prevent the symptoms of lactose intolerance.  Go easy on regular and even fat-free cheeses because they are typically high in sodium. Lean meat, poultry and fish: 6 servings or fewer a day Meat can be a rich source of protein, B vitamins, iron and zinc. Choose lean varieties and aim for no more than 6 ounces a day. Cutting back on your meat portion will allow room for more vegetables. Trim away skin and fat from poultry and meat and then bake, broil, grill or roast instead of frying in fat.  Eat heart-healthy fish, such as salmon, herring and tuna. These types of fish are high in omega-3 fatty acids, which can help lower your total cholesterol. Nuts, seeds and legumes: 4 to 5 servings a week Almonds, sunflower seeds, kidney beans, peas, lentils and other foods in this family are good sources of magnesium, potassium and  protein. They're also full of fiber and phytochemicals, which are plant compounds that may protect against some cancers and cardiovascular disease. Serving sizes are small and are intended to be consumed only a few times a week because these foods are high in calories. Examples of one serving include 1/3 cup nuts, 2 tablespoons seeds, or 1/2 cup cooked beans or peas.  Nuts sometimes get a bad rap because of their fat content, but they contain healthy types of fat -- monounsaturated fat and omega-3 fatty acids. They're high in calories, however, so eat them in moderation. Try adding them to stir-fries, salads or cereals.  Soybean-based products, such as tofu and tempeh, can be a good alternative to meat because they contain all of the amino acids your body needs to make a complete protein, just like meat. Fats and oils: 2 to 3 servings a day Fat helps your body absorb essential vitamins and helps your body's immune system. But too much fat increases your risk of heart disease, diabetes and obesity. The DASH diet strives for a healthy balance by limiting total fat to less than 30 percent of daily calories from fat, with a focus on the healthier monounsaturated fats. Examples of one serving include 1 teaspoon soft margarine, 1 tablespoon mayonnaise or 2 tablespoons salad dressing. Saturated fat and trans fat are the main dietary culprits in increasing your risk of coronary artery disease. DASH helps keep your daily saturated fat to less than 6 percent of your total calories by limiting use of meat, butter, cheese, whole milk, cream and eggs in your diet, along with foods made from lard, solid shortenings, and palm and coconut oils.  Avoid trans fat, commonly found in such processed foods as crackers, baked goods and fried items.  Read food labels on margarine and salad dressing so that you can choose those that are lowest in saturated fat and free of trans fat. Sweets: 5 servings or fewer a week You don't  have to banish sweets entirely while following the DASH diet -- just go easy on them. Examples of  one serving include 1 tablespoon sugar, jelly or jam, 1/2 cup sorbet, or 1 cup lemonade. When you eat sweets, choose those that are fat-free or low-fat, such as sorbets, fruit ices, jelly beans, hard candy, graham crackers or low-fat cookies.  Artificial sweeteners such as aspartame (NutraSweet, Equal) and sucralose (Splenda) may help satisfy your sweet tooth while sparing the sugar. But remember that you still must use them sensibly. It's OK to swap a diet cola for a regular cola, but not in place of a more nutritious beverage such as low-fat milk or even plain water.  Cut back on added sugar, which has no nutritional value but can pack on calories. DASH diet: Alcohol and caffeine Drinking too much alcohol can increase blood pressure. The Dietary Guidelines for Americans recommends that men limit alcohol to no more than two drinks a day and women to one or less. The DASH diet doesn't address caffeine consumption. The influence of caffeine on blood pressure remains unclear. But caffeine can cause your blood pressure to rise at least temporarily. If you already have high blood pressure or if you think caffeine is affecting your blood pressure, talk to your doctor about your caffeine consumption. DASH diet and weight loss While the DASH diet is not a weight-loss program, you may indeed lose unwanted pounds because it can help guide you toward healthier food choices. The DASH diet generally includes about 2,000 calories a day. If you're trying to lose weight, you may need to eat fewer calories. You may also need to adjust your serving goals based on your individual circumstances -- something your health care team can help you decide. Tips to cut back on sodium The foods at the core of the DASH diet are naturally low in sodium. So just by following the DASH diet, you're likely to reduce your sodium intake. You  also reduce sodium further by: Using sodium-free spices or flavorings with your food instead of salt  Not adding salt when cooking rice, pasta or hot cereal  Rinsing canned foods to remove some of the sodium  Buying foods labeled "no salt added," "sodium-free," "low sodium" or "very low sodium" One teaspoon of table salt has 2,325 mg of sodium. When you read food labels, you may be surprised at just how much sodium some processed foods contain. Even low-fat soups, canned vegetables, ready-to-eat cereals and sliced [place] from the local deli -- foods you may have considered healthy -- often have lots of sodium. You may notice a difference in taste when you choose low-sodium food and beverages. If things seem too bland, gradually introduce low-sodium foods and cut back on table salt until you reach your sodium goal. That'll give your palate time to adjust. Using salt-free seasoning blends or herbs and spices may also ease the transition. It can take several weeks for your taste buds to get used to less salty foods. Putting the pieces of the DASH diet together Try these strategies to get started on the DASH diet:  Change gradually. If you now eat only one or two servings of fruits or vegetables a day, try to add a serving at lunch and one at dinner. Rather than switching to all whole grains, start by making one or two of your grain servings whole grains. Increasing fruits, vegetables and whole grains gradually can also help prevent bloating or diarrhea that may occur if you aren't used to eating a diet with lots of fiber. You can also try over-the-counter products to help reduce gas  from beans and vegetables.  Reward successes and forgive slip-ups. Reward yourself with a nonfood treat for your accomplishments -- rent a movie, purchase a book or get together with a friend. Everyone slips, especially when learning something new. Remember that changing your lifestyle is a long-term process. Find out what  triggered your setback and then just pick up where you left off with the DASH diet.  Add physical activity. To boost your blood pressure lowering efforts even more, consider increasing your physical activity in addition to following the DASH diet. Combining both the DASH diet and physical activity makes it more likely that you'll reduce your blood pressure.  Get support if you need it. If you're having trouble sticking to your diet, talk to your doctor or dietitian about it. You might get some tips that will help you stick to the DASH diet. Remember, healthy eating isn't an all-or-nothing proposition. What's most important is that, on average, you eat healthier foods with plenty of variety -- both to keep your diet nutritious and to avoid boredom or extremes. And with the DASH diet, you can have both.

## 2021-02-13 ENCOUNTER — Other Ambulatory Visit: Payer: Self-pay

## 2021-02-13 NOTE — Patient Outreach (Signed)
[facility]) Care Management  02/13/2021  MORRIE DAYWALT [birth year] [id number]   Telephone outreach to patient to obtain mRS was successfully completed. MRS= 0  Thank you, [facility] Care Management Assistant

## 2021-02-19 DIAGNOSIS — L820 Inflamed seborrheic keratosis: Secondary | ICD-10-CM | POA: Diagnosis not present

## 2021-02-19 DIAGNOSIS — D2239 Melanocytic nevi of other parts of face: Secondary | ICD-10-CM | POA: Diagnosis not present

## 2021-02-19 DIAGNOSIS — G4733 Obstructive sleep apnea (adult) (pediatric): Secondary | ICD-10-CM | POA: Diagnosis not present

## 2021-02-19 DIAGNOSIS — L821 Other seborrheic keratosis: Secondary | ICD-10-CM | POA: Diagnosis not present

## 2021-02-19 DIAGNOSIS — D225 Melanocytic nevi of trunk: Secondary | ICD-10-CM | POA: Diagnosis not present

## 2021-02-19 DIAGNOSIS — D1801 Hemangioma of skin and subcutaneous tissue: Secondary | ICD-10-CM | POA: Diagnosis not present

## 2021-02-25 DIAGNOSIS — I25118 Atherosclerotic heart disease of native coronary artery with other forms of angina pectoris: Secondary | ICD-10-CM | POA: Diagnosis not present

## 2021-02-25 DIAGNOSIS — E038 Other specified hypothyroidism: Secondary | ICD-10-CM | POA: Diagnosis not present

## 2021-02-25 DIAGNOSIS — N401 Enlarged prostate with lower urinary tract symptoms: Secondary | ICD-10-CM | POA: Diagnosis not present

## 2021-02-25 DIAGNOSIS — E559 Vitamin D deficiency, unspecified: Secondary | ICD-10-CM | POA: Diagnosis not present

## 2021-02-25 DIAGNOSIS — E1165 Type 2 diabetes mellitus with hyperglycemia: Secondary | ICD-10-CM | POA: Diagnosis not present

## 2021-02-25 DIAGNOSIS — I10 Essential (primary) hypertension: Secondary | ICD-10-CM | POA: Diagnosis not present

## 2021-02-25 DIAGNOSIS — F3342 Major depressive disorder, recurrent, in full remission: Secondary | ICD-10-CM | POA: Diagnosis not present

## 2021-02-25 DIAGNOSIS — F331 Major depressive disorder, recurrent, moderate: Secondary | ICD-10-CM | POA: Diagnosis not present

## 2021-02-25 DIAGNOSIS — E782 Mixed hyperlipidemia: Secondary | ICD-10-CM | POA: Diagnosis not present

## 2021-02-25 DIAGNOSIS — D518 Other vitamin B12 deficiency anemias: Secondary | ICD-10-CM | POA: Diagnosis not present

## 2021-02-26 DIAGNOSIS — G4733 Obstructive sleep apnea (adult) (pediatric): Secondary | ICD-10-CM | POA: Diagnosis not present

## 2021-03-06 DIAGNOSIS — F3342 Major depressive disorder, recurrent, in full remission: Secondary | ICD-10-CM | POA: Diagnosis not present

## 2021-03-06 DIAGNOSIS — E038 Other specified hypothyroidism: Secondary | ICD-10-CM | POA: Diagnosis not present

## 2021-03-06 DIAGNOSIS — N401 Enlarged prostate with lower urinary tract symptoms: Secondary | ICD-10-CM | POA: Diagnosis not present

## 2021-03-06 DIAGNOSIS — E559 Vitamin D deficiency, unspecified: Secondary | ICD-10-CM | POA: Diagnosis not present

## 2021-03-06 DIAGNOSIS — E782 Mixed hyperlipidemia: Secondary | ICD-10-CM | POA: Diagnosis not present

## 2021-03-06 DIAGNOSIS — F331 Major depressive disorder, recurrent, moderate: Secondary | ICD-10-CM | POA: Diagnosis not present

## 2021-03-06 DIAGNOSIS — D518 Other vitamin B12 deficiency anemias: Secondary | ICD-10-CM | POA: Diagnosis not present

## 2021-03-06 DIAGNOSIS — E1165 Type 2 diabetes mellitus with hyperglycemia: Secondary | ICD-10-CM | POA: Diagnosis not present

## 2021-03-06 DIAGNOSIS — I10 Essential (primary) hypertension: Secondary | ICD-10-CM | POA: Diagnosis not present

## 2021-03-06 DIAGNOSIS — I25118 Atherosclerotic heart disease of native coronary artery with other forms of angina pectoris: Secondary | ICD-10-CM | POA: Diagnosis not present

## 2021-03-11 DIAGNOSIS — Z79899 Other long term (current) drug therapy: Secondary | ICD-10-CM | POA: Diagnosis not present

## 2021-03-11 DIAGNOSIS — D518 Other vitamin B12 deficiency anemias: Secondary | ICD-10-CM | POA: Diagnosis not present

## 2021-03-11 DIAGNOSIS — E038 Other specified hypothyroidism: Secondary | ICD-10-CM | POA: Diagnosis not present

## 2021-03-11 DIAGNOSIS — E559 Vitamin D deficiency, unspecified: Secondary | ICD-10-CM | POA: Diagnosis not present

## 2021-03-11 DIAGNOSIS — N401 Enlarged prostate with lower urinary tract symptoms: Secondary | ICD-10-CM | POA: Diagnosis not present

## 2021-03-11 DIAGNOSIS — I10 Essential (primary) hypertension: Secondary | ICD-10-CM | POA: Diagnosis not present

## 2021-03-11 DIAGNOSIS — N3281 Overactive bladder: Secondary | ICD-10-CM | POA: Diagnosis not present

## 2021-03-11 DIAGNOSIS — E782 Mixed hyperlipidemia: Secondary | ICD-10-CM | POA: Diagnosis not present

## 2021-03-11 DIAGNOSIS — E1165 Type 2 diabetes mellitus with hyperglycemia: Secondary | ICD-10-CM | POA: Diagnosis not present

## 2021-03-20 DIAGNOSIS — E782 Mixed hyperlipidemia: Secondary | ICD-10-CM | POA: Diagnosis not present

## 2021-03-20 DIAGNOSIS — D518 Other vitamin B12 deficiency anemias: Secondary | ICD-10-CM | POA: Diagnosis not present

## 2021-03-20 DIAGNOSIS — E559 Vitamin D deficiency, unspecified: Secondary | ICD-10-CM | POA: Diagnosis not present

## 2021-03-21 DIAGNOSIS — G4733 Obstructive sleep apnea (adult) (pediatric): Secondary | ICD-10-CM | POA: Diagnosis not present

## 2021-03-25 DIAGNOSIS — D518 Other vitamin B12 deficiency anemias: Secondary | ICD-10-CM | POA: Diagnosis not present

## 2021-03-27 ENCOUNTER — Ambulatory Visit: Payer: PPO | Admitting: Adult Health

## 2021-04-21 DIAGNOSIS — G4733 Obstructive sleep apnea (adult) (pediatric): Secondary | ICD-10-CM | POA: Diagnosis not present

## 2021-04-25 DIAGNOSIS — E038 Other specified hypothyroidism: Secondary | ICD-10-CM | POA: Diagnosis not present

## 2021-04-25 DIAGNOSIS — D518 Other vitamin B12 deficiency anemias: Secondary | ICD-10-CM | POA: Diagnosis not present

## 2021-04-25 DIAGNOSIS — N401 Enlarged prostate with lower urinary tract symptoms: Secondary | ICD-10-CM | POA: Diagnosis not present

## 2021-04-25 DIAGNOSIS — F331 Major depressive disorder, recurrent, moderate: Secondary | ICD-10-CM | POA: Diagnosis not present

## 2021-04-25 DIAGNOSIS — I25118 Atherosclerotic heart disease of native coronary artery with other forms of angina pectoris: Secondary | ICD-10-CM | POA: Diagnosis not present

## 2021-04-25 DIAGNOSIS — F3342 Major depressive disorder, recurrent, in full remission: Secondary | ICD-10-CM | POA: Diagnosis not present

## 2021-04-25 DIAGNOSIS — I10 Essential (primary) hypertension: Secondary | ICD-10-CM | POA: Diagnosis not present

## 2021-04-25 DIAGNOSIS — E1165 Type 2 diabetes mellitus with hyperglycemia: Secondary | ICD-10-CM | POA: Diagnosis not present

## 2021-04-25 DIAGNOSIS — E782 Mixed hyperlipidemia: Secondary | ICD-10-CM | POA: Diagnosis not present

## 2021-04-25 DIAGNOSIS — E559 Vitamin D deficiency, unspecified: Secondary | ICD-10-CM | POA: Diagnosis not present

## 2021-05-21 DIAGNOSIS — G4733 Obstructive sleep apnea (adult) (pediatric): Secondary | ICD-10-CM | POA: Diagnosis not present

## 2021-05-28 DIAGNOSIS — I10 Essential (primary) hypertension: Secondary | ICD-10-CM | POA: Diagnosis not present

## 2021-05-28 DIAGNOSIS — N401 Enlarged prostate with lower urinary tract symptoms: Secondary | ICD-10-CM | POA: Diagnosis not present

## 2021-05-28 DIAGNOSIS — F331 Major depressive disorder, recurrent, moderate: Secondary | ICD-10-CM | POA: Diagnosis not present

## 2021-05-28 DIAGNOSIS — F3342 Major depressive disorder, recurrent, in full remission: Secondary | ICD-10-CM | POA: Diagnosis not present

## 2021-05-28 DIAGNOSIS — E038 Other specified hypothyroidism: Secondary | ICD-10-CM | POA: Diagnosis not present

## 2021-05-28 DIAGNOSIS — E782 Mixed hyperlipidemia: Secondary | ICD-10-CM | POA: Diagnosis not present

## 2021-05-28 DIAGNOSIS — I25118 Atherosclerotic heart disease of native coronary artery with other forms of angina pectoris: Secondary | ICD-10-CM | POA: Diagnosis not present

## 2021-05-28 DIAGNOSIS — E559 Vitamin D deficiency, unspecified: Secondary | ICD-10-CM | POA: Diagnosis not present

## 2021-05-28 DIAGNOSIS — D518 Other vitamin B12 deficiency anemias: Secondary | ICD-10-CM | POA: Diagnosis not present

## 2021-05-28 DIAGNOSIS — E1165 Type 2 diabetes mellitus with hyperglycemia: Secondary | ICD-10-CM | POA: Diagnosis not present

## 2021-05-30 DIAGNOSIS — G4733 Obstructive sleep apnea (adult) (pediatric): Secondary | ICD-10-CM | POA: Diagnosis not present

## 2021-06-03 ENCOUNTER — Ambulatory Visit: Payer: PPO | Admitting: Adult Health

## 2021-06-03 ENCOUNTER — Encounter: Payer: Self-pay | Admitting: Adult Health

## 2021-06-03 VITALS — BP 128/84 | HR 70 | Ht 74.0 in | Wt 297.0 lb

## 2021-06-03 DIAGNOSIS — I10 Essential (primary) hypertension: Secondary | ICD-10-CM

## 2021-06-03 DIAGNOSIS — I480 Paroxysmal atrial fibrillation: Secondary | ICD-10-CM

## 2021-06-03 DIAGNOSIS — E785 Hyperlipidemia, unspecified: Secondary | ICD-10-CM

## 2021-06-03 DIAGNOSIS — I639 Cerebral infarction, unspecified: Secondary | ICD-10-CM

## 2021-06-03 DIAGNOSIS — Z9989 Dependence on other enabling machines and devices: Secondary | ICD-10-CM

## 2021-06-03 DIAGNOSIS — G4733 Obstructive sleep apnea (adult) (pediatric): Secondary | ICD-10-CM | POA: Diagnosis not present

## 2021-06-03 NOTE — Progress Notes (Signed)
[facility] [address] [place]. [place] [address] [phone]       STROKE FOLLOW UP NOTE  [name] Date of Birth:  [birth year] Medical Record Number:  [id number]   Reason for Referral: stroke follow up    SUBJECTIVE:   CHIEF COMPLAINT:  Chief Complaint  Patient presents with   Follow-up    RM 3 alone Pt reports , Reports he has been doing ok in regards to his stroke. No lingering issues noted.      HPI:   Today, 06/03/2021, Lance Pham returns for 63-monthstroke follow-up.  Stable since prior visit without new or reoccurring stroke/TIA symptoms -denies residual deficits.  Compliant on Eliquis and atorvastatin without associated side effects.  Blood pressure today 128/84.  Routinely monitors at home which is stable if sitting down but after approximately 10 minutes of standing, he will start to feel weak and become hypotensive with BP 80s/40s-50s.  Reports PCP considering possible POTS as contributing factor and plans on seeing a specialist for further evaluation but has not yet been referred (unable to view PCP Dr. HTheodosia Palingreports via epic).  Reports compliance on CPAP for OSA management routinely followed by pulmonology.  No further concerns at this time.    History provided for reference purposes only Initial visit 12/25/2020 JM: [name] for hospital follow-up accompanied by his wife.  He has been doing well from a stroke standpoint without residual deficits and denies new stroke/TIA symptoms.  He continues to experience fatigue and occasional lightheadedness/dizziness but this was present prior to his stroke.  He has remained on Eliquis 5 mg twice daily without bleeding or bruising.  Remains on atorvastatin without myalgias.  Blood pressure today initially 150/83 and on recheck 132/60.  Recent BP med adjustments by cardiology due to hypotension with patient reporting BP range 165-185/50 since that time.  They have follow-up next week  with cardiology.  He reports ongoing compliance with CPAP for [facility] management [facility]  No further concerns at this time.  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  [facility] received [facility]PA with resolution of symptoms [facility].  Stroke work-up revealed small cortical acute to subacute infarct in the right temporal lobe with occlusion and right MCA distal M1/proximal M2 likely due to atrial fibrillation although possible hypercoagulable disease potential etiology.  Tested positive for COVID-19 infection with Covid hypercoagulability as well as episode of PAF during coronary angiography therefore recommended initiating Eliquis for long-term anticoagulation secondary stroke prevention.  Hypercoagulable labs pending at discharge with potential clotting disorder with family history (son passing from PE and [facility]  Uncontrolled HTN requiring Cleviprex will long-term BP goal normotensive range.  LDL 72 and resumed atorvastatin 20 mg daily.  Other stroke risk factors include OSA on CPAP, advanced age, current tobacco use, EtOH use, obesity, history of stroke, family history of stroke and CAD.  Acute  embolic stroke and occlusion in right MCA distal M1/proximal M2 likely due to atrial fibrillation (one witnessed event 2020), possible hypercoagulable  disease s/p tPA at [facility] 11/20/20   Code Stroke CT head No acute abnormality at OSH.  CTA head & neck showed head and neck showed occlusion in right MCA distal M1/proximal M2 CT perfusion: 50 cc perfusion deficit involving the mid and posterior right MCAdistribution, in keeping with the previously identified right MCA occlusion. No evidence for acute core infarct by CT perfusion. MRI: Small cortical acute to subacute infarct of the right temporal lobe. No hemorrhage. 2D Echo  EF 55 to 60%, no evidence of intracardiac source of embolism, frequent PVCs noted COVID-19 POSITIVE LDL 72 HgbA1c 5.8 VTE prophylaxis is recommended.  Post tPA blood pressure: <180/170m Hg for first 24 hours the gradual reduction over next few days. On ASA 81 mg prior to admission and transitioned to Eliquis 5 mg twice daily for secondary stroke prevention Therapy recommendations: none Disposition: home     ROS:   14 system review of systems performed and negative with exception of those listed in HPI  PMH:  Past Medical History:  Diagnosis Date   A-fib ([place]    hx of   Abnormal myocardial perfusion study 07/14/2018   Acute intermittent porphyria ([place] 12/04/2020   Acute upper respiratory infection 12/04/2020   Allergic rhinitis 12/04/2020   Arthritis    RIGHT knee   BBB (bundle branch block)    hx of R BBB   Benign prostatic hyperplasia with lower urinary tract symptoms 12/04/2020   Binge eating disorder 12/04/2020   CAD in native artery 06/22/2019   Cataract    bilateral sx   Chest discomfort 07/14/2018   Chill 12/04/2020   Clotting disorder ([place]    has a genetic clotting dx -unknown name   Constipation 12/04/2020   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[facility] 12/04/2020   Cutaneous abscess 12/04/2020   Dyslipidemia 07/14/2018   Episodic lightheadedness 10/14/2019   Essential hypertension 07/14/2018   Fever 12/04/2020   Gastro-esophageal reflux disease without esophagitis 12/04/2020   Generalized anxiety  disorder 12/04/2020   Hyperglycemia due to type 2 diabetes mellitus ([place] 12/04/2020   Hyperprolactinemia ([facility] 06/02/2017   Hypertension    on meds   Hypothyroidism 12/04/2020   Iron deficiency 12/04/2020   LAFB (left anterior fascicular block) 10/14/2019   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 3 mm pituitary microadenoma. Likely nonfunctional.   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   Vitamin D deficiency 12/04/2020   Vomiting without nausea 12/04/2020    PSH:  Past Surgical History:  Procedure Laterality Date   CATARACT EXTRACTION, BILATERAL     CHOLECYSTECTOMY     COLONOSCOPY  2017   RG-mag citrtae (good)-TICS/TA x 1   FOOT SURGERY Left    tendon link  HAMMER TOE SURGERY     KNEE SURGERY Right    LAPAROSCOPIC GASTRIC BANDING     POLYPECTOMY  2017   TA x 1   WISDOM TOOTH EXTRACTION     WRIST RECONSTRUCTION Right     Social History:  Social History   Socioeconomic History   Marital status: Married    Spouse name: Not on file   Number of children: Not on file   Years of education: Not on file   Highest education level: Not on file  Occupational History   Not on file  Tobacco Use   Smoking status: Never   Smokeless tobacco: Never  Vaping Use   Vaping Use: Never used  Substance and  Sexual Activity   Alcohol use: Never   Drug use: Never   Sexual activity: Not on file  Other Topics Concern   Not on file  Social History Narrative   Not on file   Social Determinants of Health   Financial Resource Strain: Not on file  Food Insecurity: Not on file  Transportation Needs: Not on file  Physical Activity: Not on file  Stress: Not on file  Social Connections: Not on file  Intimate Partner Violence: Not on file    Family History:  Family History  Problem Relation Age of Onset   Colon cancer Mother 68   Colon polyps Mother 38   Thyroid cancer Father 29       parathyroid CA   Colon polyps Sister    Colon polyps Brother    Colon polyps Sister    Colon polyps Sister    Colon polyps Sister    Esophageal cancer Neg Hx    Stomach cancer Neg Hx    Rectal cancer Neg Hx     Medications:   Current Outpatient Medications on File Prior to Visit  Medication Sig Dispense Refill   albuterol (VENTOLIN HFA) 108 (90 Base) MCG/ACT inhaler Inhale 1 puff into the lungs every 4 (four) hours as needed for wheezing or shortness of breath.     apixaban (ELIQUIS) 5 MG TABS tablet Take 5 mg by mouth 2 (two) times daily.     atorvastatin (LIPITOR) 20 MG tablet Take 20 mg by mouth at bedtime.     buPROPion (WELLBUTRIN SR) 150 MG 12 hr tablet Take 150 mg by mouth daily.     Cholecalciferol (VITAMIN D3) 125 MCG (5000 UT) CAPS Take 5,000 Units by mouth at bedtime.     cyanocobalamin (,VITAMIN B-12,) 1000 MCG/ML injection Inject 1,000 mg into the muscle every 30 (thirty) days.     ferrous sulfate 324 (65 Fe) MG TBEC Take 65 mg by mouth daily after supper.     FLUoxetine (PROZAC) 40 MG capsule Take 40 mg by mouth at bedtime.     folic acid (FOLVITE) A999333 MCG tablet Take 400 mcg by mouth at bedtime.     guaiFENesin (MUCINEX) 600 MG 12 hr tablet Take 1 tablet (600 mg total) by mouth 2 (two) times daily. As needed for  congestion 28 tablet 0   metoprolol succinate (TOPROL-XL) 25 MG 24 hr tablet  Take 12.5 mg by mouth at bedtime.     tamsulosin (FLOMAX) 0.4 MG CAPS capsule Take 0.4 mg by mouth at bedtime.     testosterone cypionate (DEPOTESTOTERONE CYPIONATE) 100 MG/ML injection Inject 100 mg into the muscle every Thursday. For IM use only     tolterodine (DETROL LA) 4 MG 24 hr capsule Take 4 mg by mouth at  bedtime.  1   losartan (COZAAR) 50 MG tablet Take 1 tablet (50 mg total) by mouth daily. 90 tablet 3   No current facility-administered medications on file prior to visit.    Allergies:   Allergies  Allergen Reactions   Nsaids Other (See Comments)    Patient had a Lap band placed is suppose to have NO NSAIDS due to the possibility of esophageal erosion leading to band "coming through"      OBJECTIVE:  Physical Exam  Vitals:   06/03/21 0803  BP: 128/84  Pulse: 70  SpO2: 98%  Weight: 297 lb (134.7 kg)  Height: '6\' 2"'$  (1.88 m)    Body mass index is 38.13 kg/m. No results found.  General: well developed, well nourished,  very pleasant middle-age Caucasian seated, in no evident distress Head: head normocephalic and atraumatic.   Neck: supple with no carotid or supraclavicular bruits Cardiovascular: regular rate and rhythm, no murmurs Musculoskeletal: no deformity Skin:  no rash/petichiae Vascular:  Normal pulses all extremities   Neurologic Exam Mental Status: Awake and fully alert.   Fluent speech and language.  Oriented to place and time. Recent and remote memory intact. Attention span, concentration and fund of knowledge appropriate. Mood and affect appropriate.  Cranial Nerves: Pupils equal, briskly reactive to light. Extraocular movements full without nystagmus. Visual fields full to confrontation. Hearing intact. Facial sensation intact. Face, tongue, palate moves normally and symmetrically.  Motor: Normal bulk and tone. Normal strength in all tested extremity muscles Sensory.: intact to touch , pinprick , position and vibratory sensation.  Coordination:  Rapid alternating movements normal in all extremities. Finger-to-nose and heel-to-shin performed accurately bilaterally. Gait and Station: Arises from chair without difficulty. Stance is normal. Gait demonstrates normal stride length and balance without use of assistive device. Reflexes: 1+ and symmetric. Toes downgoing.        ASSESSMENT: Lance Pham is a 70 y.o. year old male presented with a facial droop, left arm weakness with neglect and difficulty speaking on 11/20/2020 with stroke work-up revealing cortical acute to subacute right temporal lobe infarct with right MCA distal M1/proximal M2 occlusion likely due to atrial fibrillation (one witnessed event 2020 during procedure, although possible hypercoagulable disorder and COVID-19 hypercoagulability in DDx. Vascular risk factors include HTN, HLD, tobacco use, OSA on CPAP, CAD, RBBB, LAFB, and PAF     PLAN:  R temporal stroke :  Recovered well without residual deficit Continue Eliquis (apixaban) daily  and atorvastatin for secondary stroke prevention.   Factor V Leiden negative - family hx of clotting disorder.  No indication for further evaluation as he will remain on Eliquis for history of A. fib Discussed secondary stroke prevention measures and importance of close PCP follow up for aggressive stroke risk factor management  Atrial fibrillation: On Eliquis 5 mg twice daily for CHA2DS2-VASc score of at least 4 routinely monitored by cardiology Dr. Bettina Gavia HTN: BP goal <130/90.  Stable today although reports becoming hypotensive after standing for greater than 10 minutes currently being evaluated by PCP for possible POTS HLD: LDL goal <70.  On atorvastatin 20 mg daily.  Reports routine lab work by PCP with lipid panel satisfactory (unable to personally view via epic) OSA on CPAP: Encouraged continued nightly use for secondary stroke prevention   Overall stable from stroke standpoint routinely followed by PCP and cardiology  -follow-up on an as-needed basis which patient agrees with   CC:  [facility] provider: Dr. Legrand Rams, Rosalyn Charters, MD  I spent 33 minutes of face-to-face and non-face-to-face time with patient.  This included previsit chart review, lab review, study review, electronic health record documentation, patient education regarding prior stroke and etiology, secondary stroke prevention measures and importance of managing stroke risk factors and answered all other questions to patients satisfaction  Frann Rider, [facility]  [facility] [address] [place], [place] [address]  Phone [phone] Fax [phone] Note: This document was prepared with digital dictation and possible smart phrase technology. Any transcriptional errors that result from this process are unintentional.

## 2021-06-03 NOTE — Patient Instructions (Signed)
Continue Eliquis (apixaban) daily  and atorvastatin for secondary stroke prevention  Continue to follow with cardiology for atrial fibrillation and Eliquis management and monitoring  Continue to follow with pulmonology for sleep apnea management on CPAP as well as ensure continued nightly usage  Continue to follow up with PCP/cardiology regarding cholesterol and blood pressure management  Maintain strict control of hypertension with blood pressure goal below 130/90 and cholesterol with LDL cholesterol (bad cholesterol) goal below 70 mg/dL.          Thank you for coming to see [place] at [facility]. I hope we have been able to provide you high quality care today.  You may receive a patient satisfaction survey over the next few weeks. We would appreciate your feedback and comments so that we may continue to improve ourselves and the health of our patients.

## 2021-06-06 NOTE — Progress Notes (Signed)
I agree with the above plan 

## 2021-06-10 DIAGNOSIS — I10 Essential (primary) hypertension: Secondary | ICD-10-CM | POA: Diagnosis not present

## 2021-06-10 DIAGNOSIS — D518 Other vitamin B12 deficiency anemias: Secondary | ICD-10-CM | POA: Diagnosis not present

## 2021-06-10 DIAGNOSIS — Z0000 Encounter for general adult medical examination without abnormal findings: Secondary | ICD-10-CM | POA: Diagnosis not present

## 2021-06-10 DIAGNOSIS — E782 Mixed hyperlipidemia: Secondary | ICD-10-CM | POA: Diagnosis not present

## 2021-06-10 DIAGNOSIS — E1165 Type 2 diabetes mellitus with hyperglycemia: Secondary | ICD-10-CM | POA: Diagnosis not present

## 2021-06-10 DIAGNOSIS — E038 Other specified hypothyroidism: Secondary | ICD-10-CM | POA: Diagnosis not present

## 2021-06-10 DIAGNOSIS — E559 Vitamin D deficiency, unspecified: Secondary | ICD-10-CM | POA: Diagnosis not present

## 2021-06-10 DIAGNOSIS — Z79899 Other long term (current) drug therapy: Secondary | ICD-10-CM | POA: Diagnosis not present

## 2021-06-10 DIAGNOSIS — N401 Enlarged prostate with lower urinary tract symptoms: Secondary | ICD-10-CM | POA: Diagnosis not present

## 2021-06-17 ENCOUNTER — Other Ambulatory Visit: Payer: Self-pay

## 2021-06-17 ENCOUNTER — Ambulatory Visit (INDEPENDENT_AMBULATORY_CARE_PROVIDER_SITE_OTHER): Payer: PPO

## 2021-06-17 ENCOUNTER — Ambulatory Visit: Payer: PPO | Admitting: Podiatry

## 2021-06-17 DIAGNOSIS — G8929 Other chronic pain: Secondary | ICD-10-CM | POA: Diagnosis not present

## 2021-06-17 DIAGNOSIS — M2031 Hallux varus (acquired), right foot: Secondary | ICD-10-CM

## 2021-06-17 DIAGNOSIS — M2041 Other hammer toe(s) (acquired), right foot: Secondary | ICD-10-CM | POA: Diagnosis not present

## 2021-06-17 DIAGNOSIS — M2011 Hallux valgus (acquired), right foot: Secondary | ICD-10-CM

## 2021-06-17 DIAGNOSIS — M79674 Pain in right toe(s): Secondary | ICD-10-CM | POA: Diagnosis not present

## 2021-06-18 DIAGNOSIS — E782 Mixed hyperlipidemia: Secondary | ICD-10-CM | POA: Diagnosis not present

## 2021-06-18 DIAGNOSIS — E559 Vitamin D deficiency, unspecified: Secondary | ICD-10-CM | POA: Diagnosis not present

## 2021-06-18 NOTE — Progress Notes (Signed)
  Subjective:  Patient ID: Lance Pham, male    DOB: 09-25-1951,  MRN: [id number]  Chief Complaint  Patient presents with   Hammer Toe    F/U rt 2nd HT -pt staes no pain unless wearing wrong shoes txLnone   70 y.o. male presents with the above complaint. History confirmed with patient. States his left foot is doing very well and he is having no pain. He is not limited by his shoe gear options. His right foot is starting to hurt especially with certain shoes. He has tried padding and tried different shoe options without relief. He would like to discuss surgery for his right foot.  Objective:  Physical Exam: warm, good capillary refill, no trophic changes or ulcerative lesions, normal DP and PT pulses, and normal sensory exam. Left Foot: toes rectus, no POP  Right Foot: rigid hammertoes 2nd/3rd toes with POP, hallux interphalangeus and malleus deformity with prominent extensor tendon. No POP plantar 2nd/3rd metatarsal area.  No images are attached to the encounter.  Radiographs: X-ray of the right foot: no fracture, dislocation, swelling or degenerative changes noted and digital contractures Assessment:   1. Hammer toe of right foot   2. Hallux malleus of right foot   3. Hallux interphalangeus, acquired, right   4. Chronic toe pain, right foot    Plan:  Patient was evaluated and treated and all questions answered.  Hammertoe -XR reviewed with patient -Educated on etiology of deformity -Patient has failed all conservative therapy and wishes to proceed with surgical intervention. All risks, benefits, and alternatives discussed with patient. No guarantees given. Consent reviewed and signed by patient. -Planned procedures: right 2nd/3rd toe hammertoe correction with screw fixation, right great toe arthroplasty and extensor lengthening as indicated. -ASA 2 - Patient with mild systemic disease with no functional limitations -Post-op anticoagulation: chemoprophylaxis not indicated -DME  dispensed for post-op use: none  No follow-ups on file.

## 2021-06-20 ENCOUNTER — Other Ambulatory Visit: Payer: Self-pay | Admitting: Podiatry

## 2021-06-20 DIAGNOSIS — M2041 Other hammer toe(s) (acquired), right foot: Secondary | ICD-10-CM

## 2021-06-20 DIAGNOSIS — M2031 Hallux varus (acquired), right foot: Secondary | ICD-10-CM

## 2021-06-21 DIAGNOSIS — G4733 Obstructive sleep apnea (adult) (pediatric): Secondary | ICD-10-CM | POA: Diagnosis not present

## 2021-06-24 DIAGNOSIS — F3342 Major depressive disorder, recurrent, in full remission: Secondary | ICD-10-CM | POA: Diagnosis not present

## 2021-06-24 DIAGNOSIS — I10 Essential (primary) hypertension: Secondary | ICD-10-CM | POA: Diagnosis not present

## 2021-06-24 DIAGNOSIS — N401 Enlarged prostate with lower urinary tract symptoms: Secondary | ICD-10-CM | POA: Diagnosis not present

## 2021-06-24 DIAGNOSIS — D518 Other vitamin B12 deficiency anemias: Secondary | ICD-10-CM | POA: Diagnosis not present

## 2021-06-24 DIAGNOSIS — E1165 Type 2 diabetes mellitus with hyperglycemia: Secondary | ICD-10-CM | POA: Diagnosis not present

## 2021-06-24 DIAGNOSIS — E559 Vitamin D deficiency, unspecified: Secondary | ICD-10-CM | POA: Diagnosis not present

## 2021-06-24 DIAGNOSIS — F331 Major depressive disorder, recurrent, moderate: Secondary | ICD-10-CM | POA: Diagnosis not present

## 2021-06-24 DIAGNOSIS — E782 Mixed hyperlipidemia: Secondary | ICD-10-CM | POA: Diagnosis not present

## 2021-06-24 DIAGNOSIS — I25118 Atherosclerotic heart disease of native coronary artery with other forms of angina pectoris: Secondary | ICD-10-CM | POA: Diagnosis not present

## 2021-06-24 DIAGNOSIS — E038 Other specified hypothyroidism: Secondary | ICD-10-CM | POA: Diagnosis not present

## 2021-07-03 ENCOUNTER — Other Ambulatory Visit: Payer: Self-pay

## 2021-07-03 ENCOUNTER — Ambulatory Visit: Payer: PPO | Admitting: Cardiology

## 2021-07-03 ENCOUNTER — Encounter: Payer: Self-pay | Admitting: Cardiology

## 2021-07-03 VITALS — Ht 74.0 in | Wt 283.0 lb

## 2021-07-03 DIAGNOSIS — I10 Essential (primary) hypertension: Secondary | ICD-10-CM

## 2021-07-03 DIAGNOSIS — E782 Mixed hyperlipidemia: Secondary | ICD-10-CM

## 2021-07-03 DIAGNOSIS — Z7901 Long term (current) use of anticoagulants: Secondary | ICD-10-CM

## 2021-07-03 DIAGNOSIS — I480 Paroxysmal atrial fibrillation: Secondary | ICD-10-CM

## 2021-07-03 DIAGNOSIS — I951 Orthostatic hypotension: Secondary | ICD-10-CM

## 2021-07-03 DIAGNOSIS — I2510 Atherosclerotic heart disease of native coronary artery without angina pectoris: Secondary | ICD-10-CM | POA: Diagnosis not present

## 2021-07-03 MED ORDER — MIDODRINE HCL 10 MG PO TABS: 10.0000 mg | ORAL_TABLET | Freq: Two times a day (BID) | ORAL | 3 refills | Status: DC

## 2021-07-03 MED ORDER — FLUDROCORTISONE ACETATE 0.1 MG PO TABS: 0.1000 mg | ORAL_TABLET | Freq: Every day | ORAL | 3 refills | Status: DC

## 2021-07-03 NOTE — Progress Notes (Signed)
Cardiology Office Note:    Date:  07/04/2021   ID:  Lance Pham, DOB 17-May-1951, MRN [id number]  PCP:  Bonnita Nasuti, MD  Cardiologist:  Shirlee More, MD    Referring MD: Bonnita Nasuti, MD    ASSESSMENT:    1. Orthostatic hypotension   2. Essential hypertension   3. Paroxysmal atrial fibrillation (HCC)   4. Chronic anticoagulation   5. CAD in native artery   6. Mixed hyperlipidemia    PLAN:    In order of problems listed above:  His clinical problem is symptomatic orthostatic hypotension dysautonomia in the context of COVID-19 severe infection hospitalization unvaccinated.  Unfortunately this part of the long COVID syndrome to mitigate symptoms have him start taking Florinef midodrine use support hose if the symptoms persist and abdominal binder.  His wife will trend and follow his blood pressures at home and keep the appointment I had office for follow-up of stroke in November I did ask him in a week to repeat labs including renal function and a fasting cortisol level.  He has not been on steroid therapy. See above presently on no antihypertensive agents this problem with symptomatic orthostatic hypotension associated COVID-19 and dysautonomia Stable in sinus rhythm antiarrhythmic drug off beta-blocker continue his current anticoagulant Stable asymptomatic he is not on a statin states that she Is not if intolerant and with history of stroke and CAD PCSK 9 inhibitor is appropriate   Next appointment: Yes   Medication Adjustments/Labs and Tests Ordered: Current medicines are reviewed at length with the patient today.  Concerns regarding medicines are outlined above.  Orders Placed This Encounter  Procedures   Basic metabolic panel   Cortisol   EKG 12-Lead   Meds ordered this encounter  Medications   fludrocortisone (FLORINEF) 0.1 MG tablet    Sig: Take 1 tablet (0.1 mg total) by mouth daily.    Dispense:  90 tablet    Refill:  3   midodrine (PROAMATINE) 10 MG tablet     Sig: Take 1 tablet (10 mg total) by mouth 2 (two) times daily.    Dispense:  180 tablet    Refill:  3    He is worked in today this is a complex visit with a new problem with severe symptomatic hypotension associated with COVID-19 infection   History of Present Illness:    Lance Pham is a 70 y.o. male with a hx of paroxysmal atrial fibrillation and stroke and chronic anticoagulation coronary artery disease hypertension and hyperlipidemia last seen 01/29/2021 for hypotension due to antihypertensive agents.  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   Left heart cath 06/22/2019 will be copied and placed in the record for completeness:  Angiographic findings   Cardiac Arteries and Lesion Findings  LMCA: Normal appearance with 0% stenosis.  LAD: The LAD is a large artery that wrap around the apex It is free of disease  all the way down to the apex After wrapping around the apex, there is a  significant stenosis. The vessel at this level is probably only 1 mm in  diameter. Very unlikely will produce any symptoms It is a very small erritory  involved.  LCx: Normal appearance with 0% stenosis.  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  He was treated with TPA with functional improvement for stroke.  Discharged with Eliquis anticoagulation.  He also had COVID-19 infection associated with his embolic stroke.  Echocardiogram performed 11/23/2019 showed mild concentric LVH  normal size ejection fraction normal 55 to 60% he had moderate biatrial enlargement and no significant valvular abnormality.  Contrast injection was not performed.  He was worked into my office hours at the direction of his wife who is present participates in evaluation. Since COVID-19 he has had a great deal of difficulty with lightheadedness she has documented blood pressures in the [id number] systolic over the last month he is symptomatic with weakness and he is administering the current normal restand of the pump and because of these episodes of symptomatic hypotension. He has no chest pain edema shortness of breath palpitation or syncope. His medical record says he has type 2 diabetes he and his wife are unaware of this diagnosis. Today in my office is standing blood pressure in the range of 123XX123 systolic and he is not symptomatic. Past Medical History:  Diagnosis Date   A-fib ([place])    hx of   Abnormal myocardial perfusion study 07/14/2018   Acute intermittent porphyria ([place]) 12/04/2020   Acute upper respiratory infection 12/04/2020   Allergic rhinitis 12/04/2020   Arthritis    RIGHT knee   BBB (bundle branch block)    hx of R BBB   Benign prostatic hyperplasia with lower urinary tract symptoms 12/04/2020   Binge eating disorder 12/04/2020   CAD in native artery 06/22/2019   Cataract    bilateral sx   Chest discomfort 07/14/2018   Chill 12/04/2020   Clotting disorder ([place])    has a genetic clotting dx -unknown name   Constipation 12/04/2020   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[place]) 12/04/2020   Cutaneous abscess 12/04/2020   Dyslipidemia 07/14/2018   Episodic lightheadedness 10/14/2019   Essential hypertension 07/14/2018   Fever 12/04/2020   Gastro-esophageal reflux disease without esophagitis 12/04/2020   Generalized anxiety disorder 12/04/2020   Hyperglycemia due to type 2 diabetes mellitus ([place]) 12/04/2020   Hyperprolactinemia ([place]) 06/02/2017   Hypertension    on meds   Hypothyroidism 12/04/2020   Iron  deficiency 12/04/2020   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 3 mm pituitary microadenoma. Likely nonfunctional.   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   Vitamin D deficiency 12/04/2020   Vomiting without nausea 12/04/2020    Past Surgical History:  Procedure Laterality Date   CATARACT EXTRACTION, BILATERAL     CHOLECYSTECTOMY  COLONOSCOPY  2017   RG-mag citrtae (good)-TICS/TA x 1   FOOT SURGERY Left    tendon link   HAMMER TOE SURGERY     KNEE SURGERY Right    LAPAROSCOPIC GASTRIC BANDING     POLYPECTOMY  2017   TA x 1   WISDOM TOOTH EXTRACTION     WRIST RECONSTRUCTION Right     Current Medications: Current Meds  Medication Sig   albuterol (VENTOLIN HFA) 108 (90 Base) MCG/ACT inhaler Inhale 1 puff into the lungs every 4 (four) hours as needed for wheezing or shortness of breath.   apixaban (ELIQUIS) 5 MG TABS tablet Take 5 mg by mouth 2 (two) times daily.   cyanocobalamin (,VITAMIN B-12,) 1000 MCG/ML injection Inject 1,000 mg into the muscle every 30 (thirty) days.   ferrous sulfate 324 (65 Fe) MG TBEC Take 65 mg by mouth daily after supper.   fludrocortisone (FLORINEF) 0.1 MG tablet Take 1 tablet (0.1 mg total) by mouth daily.   FLUoxetine  (PROZAC) 40 MG capsule Take 40 mg by mouth at bedtime.   folic acid (FOLVITE) A999333 MCG tablet Take 400 mcg by mouth at bedtime.   midodrine (PROAMATINE) 10 MG tablet Take 1 tablet (10 mg total) by mouth 2 (two) times daily.   testosterone cypionate (DEPOTESTOTERONE CYPIONATE) 100 MG/ML injection Inject 100 mg into the muscle every Thursday. For IM use only     Allergies:   Nsaids and Tamiflu [oseltamivir]   Social History   Socioeconomic History   Marital status: Married    Spouse name: Not on file   Number of children: Not on file   Years of education: Not on file   Highest education level: Not on file  Occupational History   Not on file  Tobacco Use   Smoking status: Never   Smokeless tobacco: Never  Vaping Use   Vaping Use: Never used  Substance and Sexual Activity   Alcohol use: Never   Drug use: Never   Sexual activity: Not on file  Other Topics Concern   Not on file  Social History Narrative   Not on file   Social Determinants of Health   Financial Resource Strain: Not on file  Food Insecurity: Not on file  Transportation Needs: Not on file  Physical Activity: Not on file  Stress: Not on file  Social Connections: Not on file     Family History: The patient's family history includes Colon cancer (age of onset: 65) in his mother; Colon polyps in his brother, sister, sister, sister, and sister; Colon polyps (age of onset: 71) in his mother; Thyroid cancer (age of onset: 74) in his father. There is no history of Esophageal cancer, Stomach cancer, or Rectal cancer. ROS:   Please see the history of present illness.    All other systems reviewed and are negative.  EKGs/Labs/Other Studies Reviewed:    The following studies were reviewed today:  EKG:  EKG ordered today and personally reviewed.  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 stable pattern  Recent Labs: 11/20/2020: ALT 37; BUN 17; Creatinine, Ser  1.39; Potassium 4.5; Sodium 133 11/21/2020: B Natriuretic Peptide 237.5 11/22/2020: Hemoglobin 9.4; Magnesium 1.8; Platelets 159  Recent Lipid Panel    Component Value Date/Time   CHOL 113 11/21/2020 0216   TRIG 96 11/21/2020 0216   HDL 22 (L) 11/21/2020 0216   CHOLHDL 5.1 11/21/2020 0216   VLDL 19 11/21/2020 0216   LDLCALC 72  11/21/2020 0216    Physical Exam:    VS:  Ht '6\' 2"'$  (1.88 m)   Wt 283 lb 0.6 oz (128.4 kg)   SpO2 99%   BMI 36.34 kg/m     Wt Readings from Last 3 Encounters:  07/03/21 283 lb 0.6 oz (128.4 kg)  06/03/21 297 lb (134.7 kg)  01/29/21 (!) 302 lb 6.4 oz (137.2 kg)    He appears quite weak GEN:  Well nourished, well developed in no acute distress HEENT: Normal NECK: No JVD; No carotid bruits LYMPHATICS: No lymphadenopathy CARDIAC: RRR, no murmurs, rubs, gallops RESPIRATORY:  Clear to auscultation without rales, wheezing or rhonchi  ABDOMEN: Soft, non-tender, non-distended MUSCULOSKELETAL:  No edema; No deformity  SKIN: Warm and dry NEUROLOGIC:  Alert and oriented x 3 PSYCHIATRIC:  Normal affect    Signed, Shirlee More, MD  07/04/2021 7:49 AM    [facility]

## 2021-07-03 NOTE — Patient Instructions (Signed)
Medication Instructions:  Your physician has recommended you make the following change in your medication:  START: Midodrine 10 mg take one tablet by mouth twice daily.  START: Florinef 0.1 mg take one tablet by mouth daily *If you need a refill on your cardiac medications before your next appointment, please call your pharmacy*   Lab Work: Your physician recommends that you return for lab work in: 1 week  CMP, Cortisol level. PLEASE COME FASTING FOR THESE LABS If you have labs (blood work) drawn today and your tests are completely normal, you will receive your results only by: MyChart Message (if you have MyChart) OR A paper copy in the mail If you have any lab test that is abnormal or we need to change your treatment, we will call you to review the results.   Testing/Procedures: None   Follow-Up: At [facility], you and your health needs are our priority.  As part of our continuing mission to provide you with exceptional heart care, we have created designated Provider Care Teams.  These Care Teams include your primary Cardiologist (physician) and Advanced Practice Providers (APPs -  Physician Assistants and Nurse Practitioners) who all work together to provide you with the care you need, when you need it.  We recommend signing up for the patient portal called "MyChart".  Sign up information is provided on this After Visit Summary.  MyChart is used to connect with patients for Virtual Visits (Telemedicine).  Patients are able to view lab/test results, encounter notes, upcoming appointments, etc.  Non-urgent messages can be sent to your provider as well.   To learn more about what you can do with MyChart, go to [email or web address].    Your next appointment:   Keep your scheduled appointment in November  The format for your next appointment:   In Person  Provider:   Shirlee More, MD   Other Instructions

## 2021-07-18 ENCOUNTER — Other Ambulatory Visit: Payer: Self-pay

## 2021-07-18 DIAGNOSIS — I951 Orthostatic hypotension: Secondary | ICD-10-CM

## 2021-07-19 LAB — BASIC METABOLIC PANEL
BUN/Creatinine Ratio: 9 — ABNORMAL LOW (ref 10–24)
BUN: 13 mg/dL (ref 8–27)
CO2: 24 mmol/L (ref 20–29)
Calcium: 9.1 mg/dL (ref 8.6–10.2)
Chloride: 100 mmol/L (ref 96–106)
Creatinine, Ser: 1.39 mg/dL — ABNORMAL HIGH (ref 0.76–1.27)
Glucose: 91 mg/dL (ref 65–99)
Potassium: 3.8 mmol/L (ref 3.5–5.2)
Sodium: 134 mmol/L (ref 134–144)
eGFR: 55 mL/min/{1.73_m2} — ABNORMAL LOW (ref >59)

## 2021-07-19 LAB — CORTISOL: Cortisol: 12 ug/dL

## 2021-07-22 ENCOUNTER — Telehealth: Payer: Self-pay

## 2021-07-22 DIAGNOSIS — G4733 Obstructive sleep apnea (adult) (pediatric): Secondary | ICD-10-CM | POA: Diagnosis not present

## 2021-07-22 NOTE — Telephone Encounter (Signed)
Left message on patients voicemail to please return our call.   

## 2021-07-22 NOTE — Telephone Encounter (Signed)
-----   Message from Berniece Salines, DO sent at 07/21/2021  7:37 PM EDT ----- This is Dr. Joya Gaskins patient, labs stable

## 2021-07-23 ENCOUNTER — Telehealth: Payer: Self-pay

## 2021-07-23 NOTE — Telephone Encounter (Signed)
Spoke with patient regarding results and recommendation.  Patient verbalizes understanding and is agreeable to plan of care. Advised patient to call back with any issues or concerns.  

## 2021-07-23 NOTE — Telephone Encounter (Signed)
-----   Message from Berniece Salines, DO sent at 07/21/2021  7:37 PM EDT ----- This is Dr. Joya Gaskins patient, labs stable

## 2021-07-24 ENCOUNTER — Telehealth: Payer: Self-pay | Admitting: Urology

## 2021-07-24 NOTE — Telephone Encounter (Signed)
DOS - 08/21/21  TENOLYSIS RIGHT --- [id number] HAMMERTOE REPAIR 1-3 RIGHT --- [phone]  HEALTH TEAM ADVANTAGE EFFECTIVE DATE - 03/03/16   RECEIVED FAX FROM [facility] STATING THAT CPT CODES 57903 AND 83338 HAVE BEEN APPROVED, AUTH # [id number], GOOD FROM 08/21/21 - 11/19/2021.

## 2021-08-01 DIAGNOSIS — E038 Other specified hypothyroidism: Secondary | ICD-10-CM | POA: Diagnosis not present

## 2021-08-01 DIAGNOSIS — E782 Mixed hyperlipidemia: Secondary | ICD-10-CM | POA: Diagnosis not present

## 2021-08-01 DIAGNOSIS — N401 Enlarged prostate with lower urinary tract symptoms: Secondary | ICD-10-CM | POA: Diagnosis not present

## 2021-08-01 DIAGNOSIS — D518 Other vitamin B12 deficiency anemias: Secondary | ICD-10-CM | POA: Diagnosis not present

## 2021-08-01 DIAGNOSIS — I25118 Atherosclerotic heart disease of native coronary artery with other forms of angina pectoris: Secondary | ICD-10-CM | POA: Diagnosis not present

## 2021-08-01 DIAGNOSIS — E559 Vitamin D deficiency, unspecified: Secondary | ICD-10-CM | POA: Diagnosis not present

## 2021-08-01 DIAGNOSIS — F331 Major depressive disorder, recurrent, moderate: Secondary | ICD-10-CM | POA: Diagnosis not present

## 2021-08-01 DIAGNOSIS — F3342 Major depressive disorder, recurrent, in full remission: Secondary | ICD-10-CM | POA: Diagnosis not present

## 2021-08-01 DIAGNOSIS — E1165 Type 2 diabetes mellitus with hyperglycemia: Secondary | ICD-10-CM | POA: Diagnosis not present

## 2021-08-01 DIAGNOSIS — I10 Essential (primary) hypertension: Secondary | ICD-10-CM | POA: Diagnosis not present

## 2021-08-21 ENCOUNTER — Encounter: Payer: Self-pay | Admitting: Podiatry

## 2021-08-21 ENCOUNTER — Other Ambulatory Visit: Payer: Self-pay | Admitting: Podiatry

## 2021-08-21 DIAGNOSIS — G4733 Obstructive sleep apnea (adult) (pediatric): Secondary | ICD-10-CM | POA: Diagnosis not present

## 2021-08-21 DIAGNOSIS — M2041 Other hammer toe(s) (acquired), right foot: Secondary | ICD-10-CM | POA: Diagnosis not present

## 2021-08-21 DIAGNOSIS — M2031 Hallux varus (acquired), right foot: Secondary | ICD-10-CM | POA: Diagnosis not present

## 2021-08-21 MED ORDER — CEPHALEXIN 500 MG PO CAPS: ORAL_CAPSULE | ORAL | 0 refills | Status: DC

## 2021-08-21 MED ORDER — OXYCODONE-ACETAMINOPHEN 5-325 MG PO TABS: 1.0000 | ORAL_TABLET | ORAL | 0 refills | Status: DC | PRN

## 2021-08-21 NOTE — Progress Notes (Signed)
Rx sent to pharmacy for outpatient surgery. °

## 2021-08-23 DIAGNOSIS — D518 Other vitamin B12 deficiency anemias: Secondary | ICD-10-CM | POA: Diagnosis not present

## 2021-08-23 DIAGNOSIS — I10 Essential (primary) hypertension: Secondary | ICD-10-CM | POA: Diagnosis not present

## 2021-08-23 DIAGNOSIS — F331 Major depressive disorder, recurrent, moderate: Secondary | ICD-10-CM | POA: Diagnosis not present

## 2021-08-23 DIAGNOSIS — E1165 Type 2 diabetes mellitus with hyperglycemia: Secondary | ICD-10-CM | POA: Diagnosis not present

## 2021-08-23 DIAGNOSIS — I25118 Atherosclerotic heart disease of native coronary artery with other forms of angina pectoris: Secondary | ICD-10-CM | POA: Diagnosis not present

## 2021-08-23 DIAGNOSIS — F3342 Major depressive disorder, recurrent, in full remission: Secondary | ICD-10-CM | POA: Diagnosis not present

## 2021-08-23 DIAGNOSIS — E559 Vitamin D deficiency, unspecified: Secondary | ICD-10-CM | POA: Diagnosis not present

## 2021-08-23 DIAGNOSIS — E782 Mixed hyperlipidemia: Secondary | ICD-10-CM | POA: Diagnosis not present

## 2021-08-23 DIAGNOSIS — N401 Enlarged prostate with lower urinary tract symptoms: Secondary | ICD-10-CM | POA: Diagnosis not present

## 2021-08-23 DIAGNOSIS — E038 Other specified hypothyroidism: Secondary | ICD-10-CM | POA: Diagnosis not present

## 2021-08-26 ENCOUNTER — Other Ambulatory Visit: Payer: Self-pay

## 2021-08-26 ENCOUNTER — Ambulatory Visit (INDEPENDENT_AMBULATORY_CARE_PROVIDER_SITE_OTHER): Payer: PPO

## 2021-08-26 ENCOUNTER — Ambulatory Visit (INDEPENDENT_AMBULATORY_CARE_PROVIDER_SITE_OTHER): Payer: PPO | Admitting: Podiatry

## 2021-08-26 DIAGNOSIS — M2031 Hallux varus (acquired), right foot: Secondary | ICD-10-CM

## 2021-08-26 DIAGNOSIS — M2011 Hallux valgus (acquired), right foot: Secondary | ICD-10-CM

## 2021-08-26 DIAGNOSIS — M2041 Other hammer toe(s) (acquired), right foot: Secondary | ICD-10-CM | POA: Diagnosis not present

## 2021-08-26 NOTE — Progress Notes (Signed)
  Subjective:  Patient ID: Lance Pham, male    DOB: December 27, 1950,  MRN: [id number]  No chief complaint on file.   DOS: 08/21/21 Procedure: Correction hammertoes 1,2,3, left, extensor tenotomy right great toe  71 y.o. male presents with the above complaint. History confirmed with patient.   Objective:  Physical Exam: tenderness at the surgical site, local edema noted, and calf supple, nontender. Incision: healing well, no significant drainage, no dehiscence, no significant erythema  No images are attached to the encounter.  Radiographs: X-ray of the right foot: consistent with post-op state, with good alignment and no evidence of hardware complication  Assessment:   1. Hammer toe of right foot   2. Hallux malleus of right foot   3. Hallux interphalangeus, acquired, right     Plan:  Patient was evaluated and treated and all questions answered.  Post-operative State -XR reviewed with patient -Dressing applied consisting of sterile gauze, kerlix, and ACE bandage -WBAT in CAM boot -Plan for suture removal next visit. -XRs needed at follow-up: none   No follow-ups on file.

## 2021-09-03 ENCOUNTER — Other Ambulatory Visit: Payer: Self-pay

## 2021-09-03 ENCOUNTER — Ambulatory Visit: Payer: PPO | Admitting: Cardiology

## 2021-09-03 ENCOUNTER — Encounter: Payer: Self-pay | Admitting: Cardiology

## 2021-09-03 VITALS — BP 140/80 | HR 54 | Ht 74.0 in | Wt 285.0 lb

## 2021-09-03 DIAGNOSIS — Z7901 Long term (current) use of anticoagulants: Secondary | ICD-10-CM

## 2021-09-03 DIAGNOSIS — I10 Essential (primary) hypertension: Secondary | ICD-10-CM

## 2021-09-03 DIAGNOSIS — I2510 Atherosclerotic heart disease of native coronary artery without angina pectoris: Secondary | ICD-10-CM

## 2021-09-03 DIAGNOSIS — I951 Orthostatic hypotension: Secondary | ICD-10-CM

## 2021-09-03 DIAGNOSIS — I480 Paroxysmal atrial fibrillation: Secondary | ICD-10-CM

## 2021-09-03 DIAGNOSIS — E782 Mixed hyperlipidemia: Secondary | ICD-10-CM | POA: Diagnosis not present

## 2021-09-03 DIAGNOSIS — Z23 Encounter for immunization: Secondary | ICD-10-CM | POA: Diagnosis not present

## 2021-09-03 MED ORDER — MIDODRINE HCL 10 MG PO TABS: 10.0000 mg | ORAL_TABLET | Freq: Two times a day (BID) | ORAL | 3 refills | Status: DC

## 2021-09-03 NOTE — Patient Instructions (Signed)

## 2021-09-03 NOTE — Progress Notes (Signed)
Cardiology Office Note:    Date:  09/03/2021   ID:  Lance Pham, DOB 1951-07-03, MRN [id number]  PCP:  Bonnita Nasuti, MD  Cardiologist:  Shirlee More, MD    Referring MD: Bonnita Nasuti, MD    ASSESSMENT:    1. Orthostatic hypotension   2. CAD in native artery   3. Paroxysmal atrial fibrillation (HCC)   4. Chronic anticoagulation   5. Mixed hyperlipidemia   6. Essential hypertension    PLAN:    In order of problems listed above:  He is much improved I would continue both Florinef and midodrine and will need to follow on trips standing blood pressures Stable CAD continue medical therapy he is not on lipid-lowering he has arrangements for labs and office visit with his PCP next week and will be a good candidate for PCSK9 inhibitor Stable continue his anticoagulation Not currently on antihypertensive agents   Next appointment: 1 year   Medication Adjustments/Labs and Tests Ordered: Current medicines are reviewed at length with the patient today.  Concerns regarding medicines are outlined above.  No orders of the defined types were placed in this encounter.  Meds ordered this encounter  Medications   midodrine (PROAMATINE) 10 MG tablet    Sig: Take 1 tablet (10 mg total) by mouth 2 (two) times daily.    Dispense:  180 tablet    Refill:  3     Chief Complaint  Patient presents with   Follow-up   Hypotension    History of Present Illness:    Lance Pham is a 70 y.o. male with a hx of symptomatic orthostatic hypotension due to dysautonomia in the context of COVID-19 severe infection fatigue has resolved he feels back to normal and has had no further episodes of fall or fainting, paroxysmal atrial fibrillation on chronic anticoagulation hypertension hyperlipidemia and CAD last seen 07/03/2021.  Cortisol level performed was normal at 12.  He was treated with Florinef and midodrine.  Compliance with diet, lifestyle and medications: Yes  Home blood pressure runs  in the range of 338-250 systolic he still gets lightheaded at times standing up and when he checks orthostatic signs with systolics in the range of 100.  In my office today's blood pressure is 100/60 standing but he is not lightheaded. He has remarkably improved from COVID No edema chest pain or shortness of breath Past Medical History:  Diagnosis Date   A-fib ([place])    hx of   Abnormal myocardial perfusion study 07/14/2018   Acute intermittent porphyria ([place]) 12/04/2020   Acute upper respiratory infection 12/04/2020   Allergic rhinitis 12/04/2020   Arthritis    RIGHT knee   BBB (bundle branch block)    hx of R BBB   Benign prostatic hyperplasia with lower urinary tract symptoms 12/04/2020   Binge eating disorder 12/04/2020   CAD in native artery 06/22/2019   Cataract    bilateral sx   Chest discomfort 07/14/2018   Chill 12/04/2020   Clotting disorder ([place])    has a genetic clotting dx -unknown name   Constipation 12/04/2020   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[place]) 12/04/2020   Cutaneous abscess 12/04/2020   Dyslipidemia 07/14/2018   Episodic lightheadedness 10/14/2019   Essential hypertension 07/14/2018   Fever 12/04/2020   Gastro-esophageal reflux disease without esophagitis 12/04/2020   Generalized anxiety disorder 12/04/2020   Hyperglycemia due to type 2 diabetes mellitus ([place]) 12/04/2020   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   Hypertension    on meds  Hypothyroidism 12/04/2020   Iron deficiency 12/04/2020   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 3 mm pituitary microadenoma. Likely nonfunctional.   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   Vitamin D deficiency 12/04/2020   Vomiting without nausea 12/04/2020    Past Surgical History:  Procedure Laterality Date   CATARACT EXTRACTION, BILATERAL     CHOLECYSTECTOMY     COLONOSCOPY  2017   RG-mag citrtae (good)-TICS/TA x 1   FOOT SURGERY Left    tendon link   HAMMER TOE SURGERY     KNEE SURGERY Right    LAPAROSCOPIC GASTRIC BANDING     POLYPECTOMY  2017   TA x 1   WISDOM TOOTH EXTRACTION     WRIST RECONSTRUCTION Right     Current Medications: Current Meds  Medication Sig   albuterol (VENTOLIN HFA) 108 (90 Base) MCG/ACT inhaler Inhale 1 puff into the lungs every 4 (four) hours as needed for wheezing or shortness of breath.   apixaban (ELIQUIS) 5 MG TABS tablet Take 5 mg by mouth 2 (two) times daily.   buPROPion (WELLBUTRIN) 75 MG tablet Take 75 mg by mouth daily.   cyanocobalamin (,VITAMIN B-12,) 1000 MCG/ML injection Inject 1,000 mg into the muscle every 30 (thirty) days.   ferrous sulfate 324 (65 Fe) MG TBEC Take 65 mg by mouth daily after supper.   fludrocortisone (FLORINEF) 0.1 MG tablet Take 1 tablet (0.1 mg total) by mouth daily.   FLUoxetine (PROZAC) 40 MG capsule Take 40 mg by mouth at bedtime.   folic acid (FOLVITE) 322 MCG tablet Take 400 mcg by mouth at bedtime.   tamsulosin (FLOMAX) 0.4 MG CAPS capsule Take 0.4 mg by mouth daily.   testosterone cypionate (DEPOTESTOTERONE CYPIONATE) 100 MG/ML injection Inject 100 mg into the muscle every Thursday. For IM use only   tolterodine (DETROL LA) 4 MG 24 hr capsule Take 4 mg by mouth daily.     Allergies:   Nsaids and Tamiflu [oseltamivir]    Social History   Socioeconomic History   Marital status: Married    Spouse name: Not on file   Number of children: Not on file   Years of education: Not on file   Highest education level: Not on file  Occupational History   Not on file  Tobacco Use   Smoking status: Never   Smokeless tobacco: Never  Vaping Use   Vaping Use: Never used  Substance and Sexual Activity   Alcohol use: Never   Drug use: Never   Sexual activity: Not on file  Other Topics Concern   Not on file  Social History Narrative   Not on file   Social Determinants of Health   Financial Resource Strain: Not on file  Food Insecurity: Not on file  Transportation Needs: Not on file  Physical Activity: Not on file  Stress: Not on file  Social Connections: Not on file     Family History: The patient's family history includes Colon cancer (age of onset: 102) in his mother; Colon polyps in his brother, sister, sister, sister, and sister; Colon polyps (age of onset: 47) in his mother; Thyroid cancer (age of onset: 27) in his father. There is no history of Esophageal cancer, Stomach cancer, or Rectal cancer. ROS:   Please see the history of present illness.    All other systems reviewed and are negative.  EKGs/Labs/Other Studies Reviewed:    The following studies were reviewed today:    Recent Labs: 11/20/2020: ALT 37 11/21/2020: B Natriuretic Peptide 237.5 11/22/2020: Hemoglobin 9.4; Magnesium 1.8; Platelets 159 07/18/2021: BUN 13; Creatinine, Ser 1.39; Potassium 3.8; Sodium 134  Recent Lipid Panel    Component Value Date/Time   CHOL 113 11/21/2020 0216   TRIG 96 11/21/2020 0216   HDL 22 (L) 11/21/2020 0216   CHOLHDL 5.1 11/21/2020 0216   VLDL 19 11/21/2020 0216   LDLCALC 72 11/21/2020 0216    Physical Exam:    VS:  BP 140/80 (BP Location: Right Arm, Patient Position: Sitting, Cuff Size: Normal)   Pulse (!) 54   Ht 6\' 2"  (1.88 m)   Wt 285 lb (129.3 kg)   SpO2 95%   BMI 36.59 kg/m     Wt  Readings from Last 3 Encounters:  09/03/21 285 lb (129.3 kg)  07/03/21 283 lb 0.6 oz (128.4 kg)  06/03/21 297 lb (134.7 kg)    Standing blood pressure 100/60 GEN:  Well nourished, well developed in no acute distress HEENT: Normal NECK: No JVD; No carotid bruits LYMPHATICS: No lymphadenopathy CARDIAC: RRR, no murmurs, rubs, gallops RESPIRATORY:  Clear to auscultation without rales, wheezing or rhonchi  ABDOMEN: Soft, non-tender, non-distended MUSCULOSKELETAL:  No edema; No deformity  SKIN: Warm and dry NEUROLOGIC:  Alert and oriented x 3 PSYCHIATRIC:  Normal affect    Signed, Shirlee More, MD  09/03/2021 12:45 PM    [facility]

## 2021-09-09 ENCOUNTER — Ambulatory Visit (INDEPENDENT_AMBULATORY_CARE_PROVIDER_SITE_OTHER): Payer: PPO | Admitting: Podiatry

## 2021-09-09 DIAGNOSIS — M2041 Other hammer toe(s) (acquired), right foot: Secondary | ICD-10-CM

## 2021-09-09 DIAGNOSIS — M2011 Hallux valgus (acquired), right foot: Secondary | ICD-10-CM

## 2021-09-09 DIAGNOSIS — Z9889 Other specified postprocedural states: Secondary | ICD-10-CM

## 2021-09-09 DIAGNOSIS — M2031 Hallux varus (acquired), right foot: Secondary | ICD-10-CM

## 2021-09-09 NOTE — Progress Notes (Signed)
  Subjective:  Patient ID: Lance Pham, male    DOB: 1951-07-08,  MRN: [id number]  No chief complaint on file.  DOS: 08/21/21 Procedure: Correction hammertoes 1,2,3, left, extensor tenotomy right great toe  70 y.o. male presents with the above complaint. History confirmed with patient. Doing very well, no pain. He is wearing his normal  Objective:  Physical Exam: tenderness at the surgical site, local edema noted, and calf supple, nontender. Incision: healing well, no significant drainage, no dehiscence, no significant erythema Assessment:   1. Hammer toe of right foot   2. Hallux malleus of right foot   3. Hallux interphalangeus, acquired, right   4. Post-operative state    Plan:  Patient was evaluated and treated and all questions answered.  Post-operative State -Sutures removed -Ok to start showering at this time. Advised they cannot soak. -WBAT in Surgical shoe  -Discussed importance of compliance with WB restrictions to avoid complications. -XRs needed at follow-up: 3 view Foot  No follow-ups on file.

## 2021-09-17 DIAGNOSIS — N401 Enlarged prostate with lower urinary tract symptoms: Secondary | ICD-10-CM | POA: Diagnosis not present

## 2021-09-17 DIAGNOSIS — I25118 Atherosclerotic heart disease of native coronary artery with other forms of angina pectoris: Secondary | ICD-10-CM | POA: Diagnosis not present

## 2021-09-17 DIAGNOSIS — I10 Essential (primary) hypertension: Secondary | ICD-10-CM | POA: Diagnosis not present

## 2021-09-17 DIAGNOSIS — D518 Other vitamin B12 deficiency anemias: Secondary | ICD-10-CM | POA: Diagnosis not present

## 2021-09-17 DIAGNOSIS — E038 Other specified hypothyroidism: Secondary | ICD-10-CM | POA: Diagnosis not present

## 2021-09-17 DIAGNOSIS — F331 Major depressive disorder, recurrent, moderate: Secondary | ICD-10-CM | POA: Diagnosis not present

## 2021-09-17 DIAGNOSIS — E559 Vitamin D deficiency, unspecified: Secondary | ICD-10-CM | POA: Diagnosis not present

## 2021-09-17 DIAGNOSIS — E1165 Type 2 diabetes mellitus with hyperglycemia: Secondary | ICD-10-CM | POA: Diagnosis not present

## 2021-09-17 DIAGNOSIS — F3342 Major depressive disorder, recurrent, in full remission: Secondary | ICD-10-CM | POA: Diagnosis not present

## 2021-09-17 DIAGNOSIS — E782 Mixed hyperlipidemia: Secondary | ICD-10-CM | POA: Diagnosis not present

## 2021-09-21 DIAGNOSIS — G4733 Obstructive sleep apnea (adult) (pediatric): Secondary | ICD-10-CM | POA: Diagnosis not present

## 2021-09-23 ENCOUNTER — Other Ambulatory Visit: Payer: Self-pay

## 2021-09-23 ENCOUNTER — Ambulatory Visit (INDEPENDENT_AMBULATORY_CARE_PROVIDER_SITE_OTHER): Payer: PPO | Admitting: Podiatry

## 2021-09-23 ENCOUNTER — Ambulatory Visit (INDEPENDENT_AMBULATORY_CARE_PROVIDER_SITE_OTHER): Payer: PPO

## 2021-09-23 DIAGNOSIS — M2031 Hallux varus (acquired), right foot: Secondary | ICD-10-CM

## 2021-09-23 DIAGNOSIS — M2041 Other hammer toe(s) (acquired), right foot: Secondary | ICD-10-CM | POA: Diagnosis not present

## 2021-09-23 NOTE — Progress Notes (Signed)
  Subjective:  Patient ID: Lance Pham, male    DOB: 1951-06-25,  MRN: [id number]  Chief Complaint  Patient presents with   Routine Post Op    POV #3 -pt denies N/V/F/Ch -pt denies pain -pt states foot is still very red and swollen Tx: sx shoe   DOS: 08/21/21 Procedure: Correction hammertoes 1,2,3, left, extensor tenotomy right great toe  70 y.o. male presents with the above complaint. History confirmed with patient. Denies pain or issues. Again is wearing his normal shoegear to the office today. Objective:  Physical Exam: tenderness at the surgical site, local edema noted, and calf supple, nontender. Incision: well healed.  Radiographs: taken and reviewed fusion across the IPJs noted no hardware complications or issues Assessment:   1. Hammer toe of right foot   2. Hallux malleus of right foot    Plan:  Patient was evaluated and treated and all questions answered.  Post-operative State -XR reviewed with patient -WBAT in surgical shoe for 2 more weeks, then normal shoegear   -F/u in 6 weeks for new Xrs.  Return in about 6 weeks (around 11/04/2021).

## 2021-10-14 DIAGNOSIS — R7303 Prediabetes: Secondary | ICD-10-CM | POA: Diagnosis not present

## 2021-10-14 DIAGNOSIS — N401 Enlarged prostate with lower urinary tract symptoms: Secondary | ICD-10-CM | POA: Diagnosis not present

## 2021-10-14 DIAGNOSIS — E038 Other specified hypothyroidism: Secondary | ICD-10-CM | POA: Diagnosis not present

## 2021-10-14 DIAGNOSIS — F331 Major depressive disorder, recurrent, moderate: Secondary | ICD-10-CM | POA: Diagnosis not present

## 2021-10-14 DIAGNOSIS — D518 Other vitamin B12 deficiency anemias: Secondary | ICD-10-CM | POA: Diagnosis not present

## 2021-10-14 DIAGNOSIS — F419 Anxiety disorder, unspecified: Secondary | ICD-10-CM | POA: Diagnosis not present

## 2021-10-14 DIAGNOSIS — Z79899 Other long term (current) drug therapy: Secondary | ICD-10-CM | POA: Diagnosis not present

## 2021-10-14 DIAGNOSIS — E782 Mixed hyperlipidemia: Secondary | ICD-10-CM | POA: Diagnosis not present

## 2021-10-14 DIAGNOSIS — I10 Essential (primary) hypertension: Secondary | ICD-10-CM | POA: Diagnosis not present

## 2021-10-14 DIAGNOSIS — E559 Vitamin D deficiency, unspecified: Secondary | ICD-10-CM | POA: Diagnosis not present

## 2021-11-14 ENCOUNTER — Ambulatory Visit: Payer: PPO | Admitting: Podiatry

## 2021-11-14 ENCOUNTER — Ambulatory Visit (INDEPENDENT_AMBULATORY_CARE_PROVIDER_SITE_OTHER): Payer: PPO

## 2021-11-14 ENCOUNTER — Encounter: Payer: Self-pay | Admitting: Podiatry

## 2021-11-14 DIAGNOSIS — Z9889 Other specified postprocedural states: Secondary | ICD-10-CM

## 2021-11-14 DIAGNOSIS — M2011 Hallux valgus (acquired), right foot: Secondary | ICD-10-CM

## 2021-11-14 DIAGNOSIS — M2031 Hallux varus (acquired), right foot: Secondary | ICD-10-CM

## 2021-11-14 DIAGNOSIS — M2041 Other hammer toe(s) (acquired), right foot: Secondary | ICD-10-CM

## 2021-11-14 NOTE — Progress Notes (Signed)
°  Subjective:  Patient ID: Lance Pham, male    DOB: 21-Oct-1951,  MRN: [id number]  Chief Complaint  Patient presents with   Routine Post Op    I am doing good and there is not any swelling and does not hurt with shoes at all on the right foot    DOS: 08/21/21 Procedure: Correction hammertoes 1,2,3, left, extensor tenotomy right great toe  71 y.o. male presents with the above complaint. History confirmed with patient. Denies pain or swelling. Is ambulating well in normal shoegear without issues.  He states he is very satisfied with the results of surgery. Objective:  Physical Exam: no tenderness at the surgical site, local edema noted, calf supple, nontender, toes rectus, and good joint ROM. Incision: well healed.  Radiographs: taken and reviewed PIPJ bridging noted, no screw backout or failure.  Assessment:   1. Hammer toe of right foot   2. Hallux malleus of right foot   3. Hallux interphalangeus, acquired, right   4. Post-operative state    Plan:  Patient was evaluated and treated and all questions answered.  Post-operative State -Doing very well, no issues, pleased with surgery. -XR taken and reviewed with patient. Full healing noted. -Advised to continue normal activity. Let us know of any problems or issues. Will d/c with f/u needed PRN.  Return if symptoms worsen or fail to improve.

## 2021-12-13 DIAGNOSIS — G90A Postural orthostatic tachycardia syndrome (POTS): Secondary | ICD-10-CM | POA: Insufficient documentation

## 2021-12-13 DIAGNOSIS — Z8673 Personal history of transient ischemic attack (TIA), and cerebral infarction without residual deficits: Secondary | ICD-10-CM | POA: Insufficient documentation

## 2021-12-13 DIAGNOSIS — I5032 Chronic diastolic (congestive) heart failure: Secondary | ICD-10-CM | POA: Insufficient documentation

## 2022-02-09 ENCOUNTER — Inpatient Hospital Stay (HOSPITAL_COMMUNITY)
Admission: EM | Admit: 2022-02-09 | Discharge: 2022-02-12 | DRG: 809 | Disposition: A | Discharge status: Home / Self Care | Payer: PPO | Attending: Internal Medicine | Admitting: Internal Medicine

## 2022-02-09 ENCOUNTER — Emergency Department (HOSPITAL_COMMUNITY): Payer: PPO

## 2022-02-09 ENCOUNTER — Other Ambulatory Visit: Payer: Self-pay

## 2022-02-09 ENCOUNTER — Encounter (HOSPITAL_COMMUNITY): Payer: Self-pay

## 2022-02-09 DIAGNOSIS — S42321A Displaced transverse fracture of shaft of humerus, right arm, initial encounter for closed fracture: Secondary | ICD-10-CM | POA: Diagnosis present

## 2022-02-09 DIAGNOSIS — D619 Aplastic anemia, unspecified: Principal | ICD-10-CM | POA: Diagnosis present

## 2022-02-09 DIAGNOSIS — Z79899 Other long term (current) drug therapy: Secondary | ICD-10-CM

## 2022-02-09 DIAGNOSIS — G901 Familial dysautonomia [Riley-Day]: Secondary | ICD-10-CM | POA: Diagnosis present

## 2022-02-09 DIAGNOSIS — Z8673 Personal history of transient ischemic attack (TIA), and cerebral infarction without residual deficits: Secondary | ICD-10-CM

## 2022-02-09 DIAGNOSIS — Z8371 Family history of colonic polyps: Secondary | ICD-10-CM

## 2022-02-09 DIAGNOSIS — I480 Paroxysmal atrial fibrillation: Secondary | ICD-10-CM | POA: Diagnosis present

## 2022-02-09 DIAGNOSIS — Z800 Family history of malignant neoplasm of digestive organs: Secondary | ICD-10-CM

## 2022-02-09 DIAGNOSIS — I10 Essential (primary) hypertension: Secondary | ICD-10-CM | POA: Diagnosis present

## 2022-02-09 DIAGNOSIS — S42301A Unspecified fracture of shaft of humerus, right arm, initial encounter for closed fracture: Secondary | ICD-10-CM | POA: Diagnosis present

## 2022-02-09 DIAGNOSIS — Z6836 Body mass index (BMI) 36.0-36.9, adult: Secondary | ICD-10-CM

## 2022-02-09 DIAGNOSIS — I2510 Atherosclerotic heart disease of native coronary artery without angina pectoris: Secondary | ICD-10-CM | POA: Diagnosis present

## 2022-02-09 DIAGNOSIS — Y9222 Religious institution as the place of occurrence of the external cause: Secondary | ICD-10-CM

## 2022-02-09 DIAGNOSIS — Z808 Family history of malignant neoplasm of other organs or systems: Secondary | ICD-10-CM

## 2022-02-09 DIAGNOSIS — D539 Nutritional anemia, unspecified: Secondary | ICD-10-CM | POA: Diagnosis present

## 2022-02-09 DIAGNOSIS — E538 Deficiency of other specified B group vitamins: Secondary | ICD-10-CM | POA: Diagnosis present

## 2022-02-09 DIAGNOSIS — Z7901 Long term (current) use of anticoagulants: Secondary | ICD-10-CM

## 2022-02-09 DIAGNOSIS — E2740 Unspecified adrenocortical insufficiency: Secondary | ICD-10-CM | POA: Diagnosis present

## 2022-02-09 DIAGNOSIS — N401 Enlarged prostate with lower urinary tract symptoms: Secondary | ICD-10-CM | POA: Diagnosis present

## 2022-02-09 DIAGNOSIS — T402X5A Adverse effect of other opioids, initial encounter: Secondary | ICD-10-CM | POA: Diagnosis present

## 2022-02-09 DIAGNOSIS — E039 Hypothyroidism, unspecified: Secondary | ICD-10-CM | POA: Diagnosis present

## 2022-02-09 DIAGNOSIS — I951 Orthostatic hypotension: Secondary | ICD-10-CM | POA: Diagnosis present

## 2022-02-09 DIAGNOSIS — K5903 Drug induced constipation: Secondary | ICD-10-CM | POA: Diagnosis present

## 2022-02-09 DIAGNOSIS — I452 Bifascicular block: Secondary | ICD-10-CM | POA: Diagnosis present

## 2022-02-09 DIAGNOSIS — Z888 Allergy status to other drugs, medicaments and biological substances status: Secondary | ICD-10-CM

## 2022-02-09 DIAGNOSIS — R55 Syncope and collapse: Principal | ICD-10-CM

## 2022-02-09 DIAGNOSIS — Z9884 Bariatric surgery status: Secondary | ICD-10-CM

## 2022-02-09 DIAGNOSIS — S42351A Displaced comminuted fracture of shaft of humerus, right arm, initial encounter for closed fracture: Secondary | ICD-10-CM

## 2022-02-09 DIAGNOSIS — D689 Coagulation defect, unspecified: Secondary | ICD-10-CM | POA: Diagnosis present

## 2022-02-09 DIAGNOSIS — E782 Mixed hyperlipidemia: Secondary | ICD-10-CM | POA: Diagnosis present

## 2022-02-09 DIAGNOSIS — W1830XA Fall on same level, unspecified, initial encounter: Secondary | ICD-10-CM | POA: Diagnosis present

## 2022-02-09 DIAGNOSIS — E669 Obesity, unspecified: Secondary | ICD-10-CM | POA: Diagnosis present

## 2022-02-09 DIAGNOSIS — D649 Anemia, unspecified: Secondary | ICD-10-CM | POA: Diagnosis present

## 2022-02-09 DIAGNOSIS — G4733 Obstructive sleep apnea (adult) (pediatric): Secondary | ICD-10-CM | POA: Diagnosis present

## 2022-02-09 DIAGNOSIS — E1151 Type 2 diabetes mellitus with diabetic peripheral angiopathy without gangrene: Secondary | ICD-10-CM | POA: Diagnosis present

## 2022-02-09 DIAGNOSIS — Z7952 Long term (current) use of systemic steroids: Secondary | ICD-10-CM

## 2022-02-09 DIAGNOSIS — U099 Post covid-19 condition, unspecified: Secondary | ICD-10-CM | POA: Diagnosis present

## 2022-02-09 LAB — CBC WITH DIFFERENTIAL/PLATELET
Abs Immature Granulocytes: 0 10*3/uL (ref 0.00–0.07)
Basophils Absolute: 0.1 10*3/uL (ref 0.0–0.1)
Basophils Relative: 1 %
Eosinophils Absolute: 0 10*3/uL (ref 0.0–0.5)
Eosinophils Relative: 0 %
HCT: 24.6 % — ABNORMAL LOW (ref 39.0–52.0)
Hemoglobin: 7.2 g/dL — ABNORMAL LOW (ref 13.0–17.0)
Lymphocytes Relative: 69 %
Lymphs Abs: 7.5 10*3/uL — ABNORMAL HIGH (ref 0.7–4.0)
MCH: 31.6 pg (ref 26.0–34.0)
MCHC: 29.3 g/dL — ABNORMAL LOW (ref 30.0–36.0)
MCV: 107.9 fL — ABNORMAL HIGH (ref 80.0–100.0)
Monocytes Absolute: 0.5 10*3/uL (ref 0.1–1.0)
Monocytes Relative: 5 %
Neutro Abs: 2.7 10*3/uL (ref 1.7–7.7)
Neutrophils Relative %: 25 %
Platelets: 170 10*3/uL (ref 150–400)
RBC: 2.28 MIL/uL — ABNORMAL LOW (ref 4.22–5.81)
RDW: 21.2 % — ABNORMAL HIGH (ref 11.5–15.5)
WBC: 10.8 10*3/uL — ABNORMAL HIGH (ref 4.0–10.5)
nRBC: 1.2 % — ABNORMAL HIGH (ref 0.0–0.2)

## 2022-02-09 LAB — POC OCCULT BLOOD, ED: Fecal Occult Bld: NEGATIVE

## 2022-02-09 LAB — COMPREHENSIVE METABOLIC PANEL
ALT: 21 U/L (ref 0–44)
AST: 76 U/L — ABNORMAL HIGH (ref 15–41)
Albumin: 2.2 g/dL — ABNORMAL LOW (ref 3.5–5.0)
Alkaline Phosphatase: 72 U/L (ref 38–126)
Anion gap: 5 (ref 5–15)
BUN: 14 mg/dL (ref 8–23)
CO2: 21 mmol/L — ABNORMAL LOW (ref 22–32)
Calcium: 8 mg/dL — ABNORMAL LOW (ref 8.9–10.3)
Chloride: 108 mmol/L (ref 98–111)
Creatinine, Ser: 1.19 mg/dL (ref 0.61–1.24)
GFR, Estimated: >60 mL/min (ref >60)
Glucose, Bld: 113 mg/dL — ABNORMAL HIGH (ref 70–99)
Potassium: 4.6 mmol/L (ref 3.5–5.1)
Sodium: 134 mmol/L — ABNORMAL LOW (ref 135–145)
Total Bilirubin: 1.1 mg/dL (ref 0.3–1.2)
Total Protein: 8.8 g/dL — ABNORMAL HIGH (ref 6.5–8.1)

## 2022-02-09 LAB — URINALYSIS, ROUTINE W REFLEX MICROSCOPIC
Bilirubin Urine: NEGATIVE
Glucose, UA: NEGATIVE mg/dL
Ketones, ur: NEGATIVE mg/dL
Leukocytes,Ua: NEGATIVE
Nitrite: NEGATIVE
Protein, ur: 30 mg/dL — AB
Specific Gravity, Urine: 1.017 (ref 1.005–1.030)
pH: 5 (ref 5.0–8.0)

## 2022-02-09 LAB — VITAMIN B12: Vitamin B-12: 270 pg/mL (ref 180–914)

## 2022-02-09 LAB — RETICULOCYTES
Immature Retic Fract: 32 % — ABNORMAL HIGH (ref 2.3–15.9)
RBC.: 2.35 MIL/uL — ABNORMAL LOW (ref 4.22–5.81)
Retic Count, Absolute: 43.2 10*3/uL (ref 19.0–186.0)
Retic Ct Pct: 1.8 % (ref 0.4–3.1)

## 2022-02-09 LAB — HIV ANTIBODY (ROUTINE TESTING W REFLEX): HIV Screen 4th Generation wRfx: NONREACTIVE

## 2022-02-09 LAB — FOLATE: Folate: 33 ng/mL (ref >5.9)

## 2022-02-09 LAB — IRON AND TIBC
Iron: 125 ug/dL (ref 45–182)
Saturation Ratios: 36 % (ref 17.9–39.5)
TIBC: 343 ug/dL (ref 250–450)
UIBC: 218 ug/dL

## 2022-02-09 LAB — SAVE SMEAR(SSMR), FOR PROVIDER SLIDE REVIEW

## 2022-02-09 LAB — TECHNOLOGIST SMEAR REVIEW

## 2022-02-09 LAB — PREPARE RBC (CROSSMATCH)

## 2022-02-09 LAB — FERRITIN: Ferritin: 332 ng/mL (ref 24–336)

## 2022-02-09 MED ORDER — FOLIC ACID 1 MG PO TABS
500.0000 ug | ORAL_TABLET | Freq: Every day | ORAL | Status: DC
  Administered 2022-02-09 – 2022-02-11 (×3): 0.5 mg via ORAL
  Filled 2022-02-09 (×3): qty 1

## 2022-02-09 MED ORDER — APIXABAN 5 MG PO TABS
5.0000 mg | ORAL_TABLET | Freq: Two times a day (BID) | ORAL | Status: DC
  Administered 2022-02-09 – 2022-02-12 (×7): 5 mg via ORAL
  Filled 2022-02-09 (×7): qty 1

## 2022-02-09 MED ORDER — OXYCODONE HCL 5 MG PO TABS
5.0000 mg | ORAL_TABLET | Freq: Four times a day (QID) | ORAL | Status: DC | PRN
  Administered 2022-02-09 – 2022-02-11 (×4): 5 mg via ORAL
  Filled 2022-02-09 (×4): qty 1

## 2022-02-09 MED ORDER — ACETAMINOPHEN 500 MG PO TABS
1000.0000 mg | ORAL_TABLET | Freq: Four times a day (QID) | ORAL | Status: DC | PRN
  Administered 2022-02-09: 1000 mg via ORAL
  Filled 2022-02-09: qty 2

## 2022-02-09 MED ORDER — ACETAMINOPHEN 500 MG PO TABS
1000.0000 mg | ORAL_TABLET | Freq: Three times a day (TID) | ORAL | Status: DC
  Administered 2022-02-09 – 2022-02-12 (×8): 1000 mg via ORAL
  Filled 2022-02-09 (×8): qty 2

## 2022-02-09 MED ORDER — FENTANYL CITRATE PF 50 MCG/ML IJ SOSY
50.0000 ug | PREFILLED_SYRINGE | INTRAMUSCULAR | Status: DC | PRN
  Administered 2022-02-09 (×2): 50 ug via INTRAVENOUS
  Filled 2022-02-09 (×2): qty 1

## 2022-02-09 MED ORDER — HYDROMORPHONE HCL 1 MG/ML IJ SOLN
1.0000 mg | Freq: Once | INTRAMUSCULAR | Status: AC
  Administered 2022-02-09: 1 mg via INTRAVENOUS
  Filled 2022-02-09: qty 1

## 2022-02-09 MED ORDER — MIDODRINE HCL 5 MG PO TABS
10.0000 mg | ORAL_TABLET | Freq: Two times a day (BID) | ORAL | Status: DC
  Administered 2022-02-10 (×2): 10 mg via ORAL
  Filled 2022-02-09 (×2): qty 2

## 2022-02-09 MED ORDER — SODIUM CHLORIDE 0.9 % IV SOLN: 10.0000 mL/h | Freq: Once | INTRAVENOUS | Status: DC

## 2022-02-09 MED ORDER — FLUDROCORTISONE ACETATE 0.1 MG PO TABS
0.1000 mg | ORAL_TABLET | Freq: Every day | ORAL | Status: DC
  Administered 2022-02-10: 0.1 mg via ORAL
  Filled 2022-02-09: qty 1

## 2022-02-09 MED ORDER — FLUOXETINE HCL 20 MG PO CAPS
40.0000 mg | ORAL_CAPSULE | Freq: Every day | ORAL | Status: DC
  Administered 2022-02-09 – 2022-02-11 (×3): 40 mg via ORAL
  Filled 2022-02-09 (×3): qty 2

## 2022-02-09 MED ORDER — ACETAMINOPHEN 500 MG PO TABS: 1000.0000 mg | ORAL_TABLET | Freq: Three times a day (TID) | ORAL | Status: DC

## 2022-02-09 MED ORDER — MIDODRINE HCL 5 MG PO TABS
10.0000 mg | ORAL_TABLET | Freq: Two times a day (BID) | ORAL | Status: DC
Start: 2022-02-09 — End: 2022-02-09

## 2022-02-09 NOTE — Progress Notes (Signed)
Orthopedic Tech Progress Note ?Patient Details:  ?Lance Pham ?July 23, 1951 ?[id number] ? ?Ortho Devices ?Type of Ortho Device: Coapt ?Ortho Device/Splint Location: RUE ?Ortho Device/Splint Interventions: Ordered, Application, Adjustment ?  ?Post Interventions ?Patient Tolerated: Well ?Instructions Provided: Adjustment of device, Care of device ?Splint applied with assistance from RN. ? ?[place] ?02/09/2022, 2:49 PM ? ?

## 2022-02-09 NOTE — TOC Initial Note (Signed)
Transition of Care (TOC) - Initial/Assessment Note  ? ? ?Patient Details  ?Name: Lance Pham ?MRN: [id number] ?Date of Birth: 31-Dec-1950 ? ?Transition of Care (TOC) CM/SW Contact:    ?Verdell Carmine, RN ?Phone Number: ?02/09/2022, 2:59 PM ? ?Clinical Narrative:                 ? ?Transition of Care Department [facility]) has reviewed patient and no TOC needs have been identified at this time. We will continue to monitor patient advancement through interdisciplinary progression rounds. If new patient transition needs arise, please place a TOC consult. ?  ?  ?  ?  ? ? ?Patient Goals and CMS Choice ?  ?  ?  ? ?Expected Discharge Plan and Services ?  ?  ?  ?  ?  ?                ?  ?  ?  ?  ?  ?  ?  ?  ?  ?  ? ?Prior Living Arrangements/Services ?  ?  ?  ?       ?  ?  ?  ?  ? ?Activities of Daily Living ?  ?  ? ?Permission Sought/Granted ?  ?  ?   ?   ?   ?   ? ?Emotional Assessment ?  ?  ?  ?  ?  ?  ? ?Admission diagnosis:  Symptomatic anemia [D64.9] ?Patient Active Problem List  ? Diagnosis Date Noted  ? Symptomatic anemia 02/09/2022  ? COVID-19   ? Sleep apnea   ? PONV (postoperative nausea and vomiting)   ? Hypertension   ? BBB (bundle branch block)   ? Cataract   ? Clotting disorder ([place])   ? Arthritis   ? A-fib ([place])   ? Acute intermittent porphyria ([name]) 12/04/2020  ? Acute upper respiratory infection 12/04/2020  ? Allergic rhinitis 12/04/2020  ? Benign prostatic hyperplasia with lower urinary tract symptoms 12/04/2020  ? Constipation 12/04/2020  ? 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[place]) 12/04/2020  ? Cutaneous abscess 12/04/2020  ? Gastro-esophageal reflux disease without esophagitis 12/04/2020  ? Generalized anxiety disorder 12/04/2020  ? Hyperglycemia due to type 2 diabetes mellitus ([place]) 12/04/2020  ? Hypothyroidism 12/04/2020  ? Iron deficiency 12/04/2020  ? 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 Mixed hyperlipidemia 12/04/2020  ? Moderate recurrent major depression ([place]) 12/04/2020  ? Myopathy 12/04/2020  ? Other long  term (current) drug therapy 12/04/2020  ? Other vitamin B12 deficiency anemias 12/04/2020  ? Overactive bladder 12/04/2020  ? Peripheral vascular disease ([place]) 12/04/2020  ? Recurrent major depression in full remission ([place]) 12/04/2020  ? 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 Vitamin D deficiency 12/04/2020  ? 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 Episodic lightheadedness 10/14/2019  ? 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 Palpitations 10/14/2019  ? RBBB (right bundle branch block) 10/14/2019  ? CAD in native artery 06/22/2019  ? Paroxysmal atrial fibrillation ([place]) 06/22/2019  ? Shortness of breath 06/08/2019  ? Abnormal myocardial perfusion study 07/14/2018  ? Chest discomfort 07/14/2018  ? Dyslipidemia 07/14/2018  ? Essential hypertension 07/14/2018  ? 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 ?PCP:  Bonnita Nasuti, MD ?Pharmacy:   ?[facility] [place], [place] [facility] [place] ?[address] ?[place] [address] ?Phone: [phone] Fax: [phone] ? ? ? ? ?Social Determinants of Health (SDOH) Interventions ?  ? ?  Readmission Risk Interventions ?   ? View : No data to display.  ?  ?  ?  ? ? ? ?

## 2022-02-09 NOTE — ED Provider Notes (Addendum)
[facility] EMERGENCY DEPARTMENT Provider Note   CSN: [id number] Arrival date & time: 02/09/22  1101     History  Chief Complaint  Patient presents with   Lance Pham is a 71 y.o. male.  HPI     70yo male with history atrial fibrillation on eliquis, CVA, POTS, congestive heart failure, admission for congestive heart failure, DOE in February at [facility], presents with concern for syncope and arm pain.    Stood up at [employer] at church this AM, felt lightheaded and fell down landing on his right side. Had deformity to right arm, placed in sling.  Felt like his lightheadedness was secondary to his underlying POTS. Feels back to baseline at this time.   Denies CP, dyspnea, nausea, vomiting, diarrhea,bloody stools, abdominal pain, headache, neck pain. Is being worked up for hematuria by urology but has had no gross hematuria.  Reports that his stools are chronically dark due to being on iron, but he has had no change in stool color.  The pain is on was severe, helped with the fentanyl given by EMS, worsened with movement.  Denies any numbness.  Past Medical History:  Diagnosis Date   A-fib (HCC)    hx of   Abnormal myocardial perfusion study 07/14/2018   Acute intermittent porphyria (HCC) 12/04/2020   Acute upper respiratory infection 12/04/2020   Allergic rhinitis 12/04/2020   Arthritis    RIGHT knee   BBB (bundle branch block)    hx of R BBB   Benign prostatic hyperplasia with lower urinary tract symptoms 12/04/2020   Binge eating disorder 12/04/2020   CAD in native artery 06/22/2019   Cataract    bilateral sx   Chest discomfort 07/14/2018   Chill 12/04/2020   Clotting disorder (HCC)    has a genetic clotting dx -unknown name   Constipation 12/04/2020   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HCC) 12/04/2020   Cutaneous abscess 12/04/2020   Dyslipidemia 07/14/2018   Episodic lightheadedness 10/14/2019   Essential hypertension 07/14/2018   Fever 12/04/2020    Gastro-esophageal reflux disease without esophagitis 12/04/2020   Generalized anxiety disorder 12/04/2020   Hyperglycemia due to type 2 diabetes mellitus (HCC) 12/04/2020   Hyperprolactinemia (HCC) 06/02/2017   Hypertension    on meds   Hypothyroidism 12/04/2020   Iron deficiency 12/04/2020   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 3 mm pituitary microadenoma. Likely nonfunctional.   PONV (postoperative nausea and vomiting)    RBBB (right bundle branch block) 10/14/2019   Recurrent major depression in full remission (HCC) 12/04/2020   Shortness of breath 06/08/2019   Sleep apnea    use CPAP   Stroke (cerebrum) (HCC) 11/21/2020   Testicular hypofunction 12/04/2020   Vitamin D deficiency 12/04/2020   Vomiting without nausea 12/04/2020    Home Medications Prior to Admission medications   Medication Sig Start Date End Date Taking? Authorizing Provider  albuterol (VENTOLIN HFA) 108 (90 Base) MCG/ACT inhaler Inhale 1 puff into the lungs every 4 (four) hours as needed  for wheezing or shortness of breath.    [provider]  apixaban (ELIQUIS) 5 MG TABS tablet Take 5 mg by mouth 2 (two) times daily.    [provider]  buPROPion (WELLBUTRIN SR) 150 MG 12 hr tablet Take 150 mg by mouth daily. 10/12/21   [provider]  buPROPion (WELLBUTRIN) 75 MG tablet Take 75 mg by mouth daily.    [provider]  cyanocobalamin (,VITAMIN B-12,) 1000 MCG/ML injection Inject 1,000 mg into the  muscle every 30 (thirty) days. 11/23/20   [provider]  ferrous sulfate 324 (65 Fe) MG TBEC Take 65 mg by mouth daily after supper.    [provider]  fludrocortisone (FLORINEF) 0.1 MG tablet Take 1 tablet (0.1 mg total) by mouth daily. 07/03/21   Baldo Daub, MD  FLUoxetine (PROZAC) 40 MG capsule Take 40 mg by mouth at bedtime. 01/16/20   [provider]  folic acid (FOLVITE) 400 MCG tablet Take 400 mcg by mouth at bedtime.    [provider]  losartan (COZAAR) 50 MG tablet Take 50 mg by mouth daily. 08/19/21   [provider]  midodrine (PROAMATINE) 10 MG tablet Take 1 tablet (10 mg total) by mouth 2 (two) times daily. 09/03/21   Baldo Daub, MD  tamsulosin (FLOMAX) 0.4 MG CAPS capsule Take 0.4 mg by mouth daily.    [provider]  testosterone cypionate (DEPOTESTOSTERONE CYPIONATE) 200 MG/ML injection SMARTSIG:1 Milliliter(s) IM Every 10 Days 08/08/21   [provider]  testosterone cypionate (DEPOTESTOTERONE CYPIONATE) 100 MG/ML injection Inject 100 mg into the muscle every Thursday. For IM use only    [provider]  tolterodine (DETROL LA) 4 MG 24 hr capsule Take 4 mg by mouth daily.    [provider]      Allergies    Nsaids and Tamiflu [oseltamivir]    Review of Systems   Review of Systems  Physical Exam Updated Vital Signs BP (!) 179/82 (BP Location: Left Arm)   Pulse 65   Temp 98.1 F (36.7 C) (Oral)   Resp 18   SpO2 97%  Physical Exam Vitals and nursing note reviewed.  Constitutional:      General: He is not in acute distress.    Appearance: He is well-developed. He is not diaphoretic.  HENT:     Head: Normocephalic and atraumatic.  Eyes:     Conjunctiva/sclera: Conjunctivae normal.  Cardiovascular:     Rate and Rhythm: Normal rate and regular rhythm.     Heart sounds: Normal heart sounds. No murmur heard.   No friction rub. No gallop.  Pulmonary:     Effort: Pulmonary effort  is normal. No respiratory distress.     Breath sounds: Normal breath sounds. No wheezing or rales.  Abdominal:     General: There is no distension.     Palpations: Abdomen is soft.     Tenderness: There is no abdominal tenderness. There is no guarding.  Musculoskeletal:        General: Swelling, tenderness and deformity (right arm) present.     Cervical back: Normal range of motion.  Skin:    General: Skin is warm and dry.  Neurological:     Mental Status: He is alert and oriented to person, place, and time.     Motor: No weakness.    ED Results / Procedures / Treatments   Labs (all labs ordered are listed, but only abnormal results are displayed) Labs Reviewed  CBC WITH DIFFERENTIAL/PLATELET - Abnormal; Notable for the following components:      Result Value   WBC 10.8 (*)    RBC 2.28 (*)    Hemoglobin 7.2 (*)    HCT 24.6 (*)    MCV 107.9 (*)    MCHC 29.3 (*)    RDW 21.2 (*)    nRBC 1.2 (*)    Lymphs Abs 7.5 (*)    All other components within normal limits  COMPREHENSIVE METABOLIC PANEL - Abnormal; Notable for the following components:   Sodium 134 (*)    CO2 21 (*)    Glucose, Bld 113 (*)    Calcium 8.0 (*)    Total Protein 8.8 (*)    Albumin 2.2 (*)    AST 76 (*)    All other components within normal limits  RETICULOCYTES - Abnormal; Notable for the following components:   RBC. 2.35 (*)    Immature Retic Fract 32.0 (*)    All other components within normal limits  URINALYSIS, ROUTINE W REFLEX MICROSCOPIC - Abnormal; Notable for the following components:   Color, Urine AMBER (*)    APPearance CLOUDY (*)    Hgb urine dipstick SMALL (*)    Protein, ur 30 (*)    Bacteria, UA RARE (*)    All other components within normal limits  VITAMIN B12  FOLATE  IRON AND TIBC  FERRITIN  HIV ANTIBODY (ROUTINE TESTING W REFLEX)  TECHNOLOGIST SMEAR REVIEW  SAVE SMEAR(SSMR), FOR PROVIDER SLIDE REVIEW  COPPER, SERUM  PATHOLOGIST SMEAR REVIEW  PROTEIN ELECTROPHORESIS, SERUM   UIFE/LIGHT CHAINS/TP QN, 24-HR UR  HEMOGLOBIN AND HEMATOCRIT, BLOOD  CBC  COMPREHENSIVE METABOLIC PANEL  POC OCCULT BLOOD, ED  TYPE AND SCREEN  PREPARE RBC (CROSSMATCH)    EKG EKG Interpretation  Date/Time:  Sunday February 09 2022 11:11:30 EDT Ventricular Rate:  56 PR Interval:    QRS Duration: 163 QT Interval:  515 QTC Calculation: 498 R Axis:   -63 Text Interpretation: Normal sinus rhythm RBBB and LAFB Left ventricular hypertrophy No significant change since last tracing Confirmed by Alvira Monday ([id number]) on 02/09/2022 11:19:00 AM  Radiology DG Shoulder Right  Result Date: 02/09/2022 CLINICAL DATA:  Fall with trauma to the right shoulder and arm. EXAM: RIGHT SHOULDER - 2+ VIEW COMPARISON:  None. FINDINGS: No fracture or dislocation at the shoulder joint. Humeral diaphyseal fracture described on separate report. IMPRESSION: Negative shoulder evaluation.  See results of humerus exam. Electronically Signed   By: Paulina Fusi M.D.   On: 02/09/2022 12:23   CT Head Wo Contrast  Result Date: 02/09/2022 CLINICAL DATA:  Patient fell from standing. EXAM: CT HEAD WITHOUT CONTRAST CT CERVICAL SPINE WITHOUT CONTRAST TECHNIQUE: Multidetector CT imaging of the head and cervical spine was performed following the standard protocol without intravenous contrast. Multiplanar CT image reconstructions of the cervical spine were also generated. RADIATION DOSE REDUCTION: This exam was performed according to the departmental dose-optimization program which includes automated exposure control, adjustment of the mA and/or kV according to patient size and/or use of iterative reconstruction technique. COMPARISON:  Head CT 11/21/2020. FINDINGS: CT HEAD FINDINGS Brain: There is no evidence for acute hemorrhage, hydrocephalus, mass lesion, or abnormal extra-axial fluid collection. No definite CT evidence for acute infarction. Vascular: No hyperdense vessel or unexpected calcification. Skull: No evidence for fracture.  No worrisome lytic or sclerotic lesion. Sinuses/Orbits: Visualized paranasal sinuses are clear. Right mastoid effusion is similar to prior. Other: None. CT CERVICAL SPINE FINDINGS Alignment: Normal. Skull  base and vertebrae: No acute fracture. No primary bone lesion or focal pathologic process. Soft tissues and spinal canal: No prevertebral fluid or swelling. No visible canal hematoma. Disc levels: Loss of disc height with endplate degeneration noted C6-7 and C7-T1. Upper chest: Unremarkable. Other: None. IMPRESSION: 1. No acute intracranial abnormality. 2. No cervical spine fracture or subluxation. 3. Degenerative changes in the lower cervical spine. Electronically Signed   By: Kennith Center M.D.   On: 02/09/2022 11:59   CT Cervical Spine Wo Contrast  Result Date: 02/09/2022 CLINICAL DATA:  Patient fell from standing. EXAM: CT HEAD WITHOUT CONTRAST CT CERVICAL SPINE WITHOUT CONTRAST TECHNIQUE: Multidetector CT imaging of the head and cervical spine was performed following the standard protocol without intravenous contrast. Multiplanar CT image reconstructions of the cervical spine were also generated. RADIATION DOSE REDUCTION: This exam was performed according to the departmental dose-optimization program which includes automated exposure control, adjustment of the mA and/or kV according to patient size and/or use of iterative reconstruction technique. COMPARISON:  Head CT 11/21/2020. FINDINGS: CT HEAD FINDINGS Brain: There is no evidence for acute hemorrhage, hydrocephalus, mass lesion, or abnormal extra-axial fluid collection. No definite CT evidence for acute infarction. Vascular: No hyperdense vessel or unexpected calcification. Skull: No evidence for fracture. No worrisome lytic or sclerotic lesion. Sinuses/Orbits: Visualized paranasal sinuses are clear. Right mastoid effusion is similar to prior. Other: None. CT CERVICAL SPINE FINDINGS Alignment: Normal. Skull base and vertebrae: No acute fracture. No  primary bone lesion or focal pathologic process. Soft tissues and spinal canal: No prevertebral fluid or swelling. No visible canal hematoma. Disc levels: Loss of disc height with endplate degeneration noted C6-7 and C7-T1. Upper chest: Unremarkable. Other: None. IMPRESSION: 1. No acute intracranial abnormality. 2. No cervical spine fracture or subluxation. 3. Degenerative changes in the lower cervical spine. Electronically Signed   By: Kennith Center M.D.   On: 02/09/2022 11:59   DG Humerus Right  Result Date: 02/09/2022 CLINICAL DATA:  Fall with pain and deformity. EXAM: RIGHT HUMERUS - 2+ VIEW COMPARISON:  None. FINDINGS: Mildly comminuted complete transverse fracture of the mid diaphysis of the humerus with slight anterior angulation and displacement of about 1.5 cm. Cannot completely rule out the possibility of an underlying lytic lesion. IMPRESSION: Mildly comminuted, angulated and displaced fracture of the mid humeral diaphysis. Question underlying lytic abnormality, though this is not certain. Electronically Signed   By: Paulina Fusi M.D.   On: 02/09/2022 12:25    Procedures .Critical Care Performed by: Alvira Monday, MD Authorized by: Alvira Monday, MD   Critical care provider statement:    Critical care time (minutes):  30   Critical care was time spent personally by me on the following activities:  Development of treatment plan with patient or surrogate, discussions with consultants, evaluation of patient's response to treatment, examination of patient, ordering and review of laboratory studies, ordering and review of radiographic studies, ordering and performing treatments and interventions, pulse oximetry, re-evaluation of patient's condition and review of old charts    Medications Ordered in ED Medications  0.9 %  sodium chloride infusion (has no administration in time range)  oxyCODONE (Oxy IR/ROXICODONE) immediate release tablet 5 mg (5 mg Oral Given by Other 02/09/22 2244)   apixaban (ELIQUIS) tablet 5 mg (5 mg Oral Given 02/09/22 2107)  FLUoxetine (PROZAC) capsule 40 mg (40 mg Oral Given 02/09/22 2107)  fludrocortisone (FLORINEF) tablet 0.1 mg (has no administration in time range)  folic acid (FOLVITE) tablet 0.5 mg (0.5  mg Oral Given 02/09/22 2108)  midodrine (PROAMATINE) tablet 10 mg (has no administration in time range)  acetaminophen (TYLENOL) tablet 1,000 mg (1,000 mg Oral Given 02/09/22 2108)  HYDROmorphone (DILAUDID) injection 1 mg (1 mg Intravenous Given 02/09/22 1431)    ED Course/ Medical Decision Making/ A&P                           Medical Decision Making Amount and/or Complexity of Data Reviewed Labs: ordered. Radiology: ordered.  Risk Prescription drug management. Decision regarding hospitalization.   71yo male with history atrial fibrillation on eliquis, CVA, POTS, congestive heart failure, admission for congestive heart failure, DOE in February at [facility], presents with concern for syncope and arm pain.  Regarding the trauma of the fall: CT head and cervical spine completed shows no evidence of intracranial bleeding or fracture.  X-ray of the shoulder and arm show midshaft humerus fracture.  There are no signs of compartment syndrome, he is neurovascular intact, this is a closed fracture.  Discussed with orthopedics, Dr.Dean. Will place in coaptation splint, sling, have Ortho see while he is in the hospital. No sign of other injuries by history or exam. Radiology also reading question underlying lytic abnormality.  DDx for syncope includes cardiac arrhythmia, MI, PE, electrolyte abnormality, hypovolemia including dehydration and anemia/GI bleed, infection, POTS, orthostatic syncope.  No sign of infection or sepsis, denies chest pain or shortness of breath and have low suspicion for MI or PE.  No significant electrolyte abnormalities.  Lab work is significant for new hemoglobin of 7.2, from prior values of 9.  He describes having chronically dark stool  secondary to taking iron.  On my rectal exam, I was not able to obtain a significant stool sample which tested negative for blood, possibly due to it being a poor sample.   Will admit given decrease in hgb to 7.2, pt on eliquis, syncope today.  Ordered 1U pRBC and consented for blood and admitted for further care.          Final Clinical Impression(s) / ED Diagnoses Final diagnoses:  Syncope, unspecified syncope type  Anemia, unspecified type  Closed displaced comminuted fracture of shaft of right humerus, initial encounter    Rx / DC Orders ED Discharge Orders     None         Alvira Monday, MD 02/09/22 2350    Alvira Monday, MD 02/09/22 2351

## 2022-02-09 NOTE — Hospital Course (Addendum)
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 See hospital course by problem below. ?  ?Presyncopal episode, multifactorial ?Patient presented with presyncopal episode found to be likely multifactorial in etiology. He has ten year history of presyncopal episodes acutely worsened in the last few months and was previously being treated for suspected orthostatic hypotension secondary to COVID related dysautonomia with midodrine BID and Florinef daily. Patient found to have worsened macrocytic anemia, as below, and evidence of RBBB and LAFB on admission EKG, likely contributing to presyncopal episodes. Cardiology was consulted. TTE was performed which showed no significant changes compared to previous on 11/2020 excluding new onset cardiac pathology contributing to presentation. Orthostatic vitals were obtained and patient found to be profoundly orthostatic with a significant total decrease in systolic BP of 85IDPO with sitting to standing and HR remaining inappropriately in the 60s. Patient was trailed on abdominal binder and medications were adjusted; Florinef was discontinued, midodrine was decreased to 60m TID, and Mestinon 376mBID was started. Daily orthostatic vital on patient obtained to monitor response to medication changes. Patient showed some improvement on orthostatic vitals with total decrease in systolic BP of 6324MPNTith sitting to standing with unchanged HR in the 60s on repeat testing. Orthostatics on day of discharge with abdominal binder placed showed further  improvement with total decrease in systolic BP of 44 mmHg with sitting to standing. On discharge, patient expressing subjective improvement to symptoms and return to baseline with improved dizziness/lightheadedness with standing. He will continue to work with PT outpatient and discharge on midodrine 52m43mID and Mestinon 12m22mD. He will need close follow-up with outpatient cardiologist to manage medication regimen and monitor changes seen on EKG. Patient to schedule appointment with already established provider, Dr. MunlBettina Gavia?2. Symptomatic acute on chronic hypo-proliferative macrocytic anemia ?History of iron deficiency anemia ?Hx of B12 deficiency ?Patient presented with hemoglobin 7.2, MCV 107, and symptoms of presyncope, generalized weakness, fatigue, and SOB on exertion. On chart review Hgb found to be downtrending over the last four months. X-ray on admission for humerus fracture was obtained, as below, with concern for lytic lesion.  Patient was given 1u pRBC transfusion and further worked up for cause of symptomatic macrocytic anemia. Folic acid found to be normal and given hx of IDA, iron panel done showing normal iron studies. B12 found to be low-normal, MMA level obtained and found to be wnl, B12 repleated with IM injections while inpatient. Given hx of Lap-Band surgery Copper level obtained and found to be within normal range.  Patient found to have low retic index indicating hyperproliferation of RBCs, with protein gap but normal kidney function and normal calcium levels. Workup for Multiple Myeloma as source of macrocytic anemia initiated, SPEP and UPEP currently pending will follow up results with patient. Skeletal survey was done, patient found to have no additional lytic or blastic osseous lesions. Given concern for malignant metastasis causing lytic bone lesion, CT Chest/Abd/pelvis obtained to look for primary source. CT with subcentimeter nodular densities in the left upper lung fields  suggesting scarring or granulomas or neoplastic process, patient to have follow-up CT in 6  months to re-evaluate. 14 mm nodule of the left adrenal also seen on imaging, unchanged from CT on 06/20/2018, suggesting incidental benign adenoma. No primary malignancy identified at this time. Patient remained at stable Hgb during admission requiring no additional pRBC transfusions. On discharge patient asymptomatic with stable Hgb of 8. Etiology remains unclear, patient to follow up with IMTS or PCP for continued monitoring. ? ?3. Angulated and displaced fracture of the mid humeral diaphysis ?Patient presenting with ground-level fall secondary to presyncopal episode as above. X-ray obtained of R humerus found to have angulated and displaced fracture of the mid ?humeral diaphysis. Corrected calcium normal at 9.4. Orthopedics consulted and splint applied by [facility]. Patient maintained on appropriate pain management regimen with Oxy 95m q6hrs prn while inpatient. He denied numbness, tingling, pain in his distal RUE throughout admission; low concern for compartment syndrome. Ortho will proceed with non-operative management, outpatient follow up with Dr. DMarlou Saafter discharge.  ?  ?4.Opioid related constipation  ?Patient with hx of chronic loose stools found to be constipated while admitted after starting opioid for pain control as above. Appropriate bowel regime was given, improved on discharge. ?  ?5. Paroxysmal atrial fibrillation ?History of embolic CVA ?Was continued on home Eliquis 5 mg twice daily with cardiac monitoring during admission. Patient remained in NSR throughout admission.  ?  ?6. Depression ?Patient continued on home Prozac 40 mg daily. ?

## 2022-02-09 NOTE — H&P (Addendum)
? ? ? ?Date: 02/09/2022     ?     ?     ?Patient Name:  Lance Pham MRN: [id number]  ?DOB: December 21, 1950 Age / Sex: 71 y.o., male   ?PCP: Bonnita Nasuti, MD    ?     ?Medical Service: Internal Medicine Teaching Service    ?     ?Attending Physician: Dr. Charise Killian, MD    ?First Contact: Dr. Wayland Denis Pager: [phone]  ?Second Contact: Dr. Iona Beard Pager: [phone]  ?     ?After Hours (After 5p/  First Contact Pager: [phone]  ?weekends / holidays): Second Contact Pager: [phone]  ? ?Chief Complaint: Ground level fall, R arm pain/deformity, presyncopal episode ? ?History of Present Illness:  ?Kaleo R Nhan is a 71 yo male with PMHx of PAF on Eliquis, RBBB, LAFB, HLD, hx HTN, recent hx orthostatic hypotension (2/2 dysautonomia in the context of COVID-19 severe infection fatigue on midodrine and florinef), OSA, hx embolic stroke (R MCA distal M1, proximal M2) without residual deficits, chronic IDA with macrocytosis, obesity s/p gastritic banding surgery, who presented to [facility] ED after ground level fall at church with presyncopal symptoms, now with R arm pain and gross deformity. ? ?Patient seen in the ED with patient's wife at bedside.  Patient's wife assisted with history taking.  Patient reports he was at church earlier today, had been standing and then left the pew when he began to feel dizzy, lightheaded.  He then stepped towards the pew to sit down but felt weak and fell on his right side into the pew before he was able to catch himself.  He did not lose consciousness or hit his head.  He had severe pain in his right arm for which he presented to the emergency room. ? ?Patient has had symptoms of dizziness upon standing for the last 10 years, this has gotten substantially worse in the last few months. Recent hospitalization for presyncopal episode 12/2021, 2 months ago. Patient's wife reporting he spends most of his time at home lying down, he feels "uncomfortable" "foggy" when he is stilling upright. When he  does walk around the home, he does not use an assistive device. Has been prescribed midodrine and florinef for blood pressure support, endorses adherence to midodrine and is unsure if he has been taking the Florinef recently.  Patient's wife states he often misses his morning dose of midodrine because he is sleeping. ? ?During last hospitalization patient was treated for UTI, UA with microhematuria.  Patient denies dysuria.  Does endorse urinary frequency every 2 hours, though also has history of BPH status post UroLift surgery.  Patient denies gross hematuria.  Patient also denying hematemesis.  Patient unsure what his stool looks like, reports chronic loose stools.  Poor p.o. intake since losing taste secondary to COVID 1 year ago.  Reports weight loss due to this.  Patient's mother had colon cancer, patient's multiple sisters and brother diagnosed with colon polyps.  Patient reports he is due for colonoscopy in November.  Last colonoscopy 09/25/2020 with Dr. Jackquline Denmark, showed 3 sessile polyps which were removed, diverticula, nonbleeding internal hemorrhoids.  Pathology showed tubular adenoma, no high-grade malignancy.  ? ?ED Course: On arrival, patient bradycardic with HR in the 50s and normotensive systolic 254Y.  Good saturations on room air.  Hemoglobin 7.2 in the ED.  1 unit of PRBCs ordered.  Fentanyl given for mildly comminuted, angulated and displaced fracture of the mid humeral diaphysis seen on  humerus x-ray.  ED provider consulted orthopedics.  Medicine called for admission for symptomatic anemia. ? ?Meds:  ?Current Outpatient Medications  ?Medication Instructions  ? albuterol (VENTOLIN HFA) 108 (90 Base) MCG/ACT inhaler 1 puff, Inhalation, Every 4 hours PRN  ? apixaban (ELIQUIS) 5 mg, Oral, 2 times daily  ? buPROPion (WELLBUTRIN SR) 150 mg, Oral, Daily  ? buPROPion (WELLBUTRIN) 75 mg, Oral, Daily  ? cyanocobalamin ((VITAMIN B-12)) 1,000 mg, Intramuscular, Every 30 days  ? ferrous sulfate 65 mg,  Oral, Daily after supper  ? fludrocortisone (FLORINEF) 0.1 mg, Oral, Daily  ? FLUoxetine (PROZAC) 40 mg, Oral, Daily at bedtime  ? folic acid (FOLVITE) 939 mcg, Oral, Daily at bedtime  ? losartan (COZAAR) 50 mg, Oral, Daily  ? midodrine (PROAMATINE) 10 mg, Oral, 2 times daily  ? tamsulosin (FLOMAX) 0.4 mg, Oral, Daily  ? testosterone cypionate (DEPOTESTOSTERONE CYPIONATE) 200 MG/ML injection SMARTSIG:1 Milliliter(s) IM Every 10 Days  ? testosterone cypionate (DEPOTESTOTERONE CYPIONATE) 100 mg, Intramuscular, Every Thu, For IM use only  ? tolterodine (DETROL LA) 4 mg, Oral, Daily  ?Patient has not needed albuterol.  Not currently taking Wellbutrin.  Patient is unsure if he is taking Florinef.  He has skipped some of his vitamin B12 injections.  Losartan was discontinued.  Not taking Flomax.  Tolterodine discontinued. ? ?Allergies: ?Allergies as of 02/09/2022 - Review Complete 02/09/2022  ?Allergen Reaction Noted  ? Nsaids Other (See Comments) 11/20/2020  ? Tamiflu [oseltamivir] Nausea Only and Other (See Comments) 11/16/2020  ? ?Past Medical History:  ?Diagnosis Date  ? A-fib ([place])   ? hx of  ? Abnormal myocardial perfusion study 07/14/2018  ? Acute intermittent porphyria ([place]) 12/04/2020  ? Acute upper respiratory infection 12/04/2020  ? Allergic rhinitis 12/04/2020  ? Arthritis   ? RIGHT knee  ? BBB (bundle branch block)   ? hx of R BBB  ? Benign prostatic hyperplasia with lower urinary tract symptoms 12/04/2020  ? Binge eating disorder 12/04/2020  ? CAD in native artery 06/22/2019  ? Cataract   ? bilateral sx  ? Chest discomfort 07/14/2018  ? Chill 12/04/2020  ? Clotting disorder ([place])   ? has a genetic clotting dx -unknown name  ? Constipation 12/04/2020  ? 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 Cutaneous abscess 12/04/2020  ? Dyslipidemia 07/14/2018  ? Episodic lightheadedness 10/14/2019  ? Essential hypertension 07/14/2018  ? Fever 12/04/2020  ? Gastro-esophageal reflux disease without esophagitis 12/04/2020  ? Generalized  anxiety disorder 12/04/2020  ? Hyperglycemia due to type 2 diabetes mellitus ([place]) 12/04/2020  ? Hyperprolactinemia ([place]) 06/02/2017  ? Hypertension   ? on meds  ? Hypothyroidism 12/04/2020  ? Iron deficiency 12/04/2020  ? 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 Mixed hyperlipidemia 12/04/2020  ? Moderate recurrent major depression ([place]) 12/04/2020  ? Myopathy 12/04/2020  ? Other long term (current) drug therapy 12/04/2020  ? Other vitamin B12 deficiency anemias 12/04/2020  ? Overactive bladder 12/04/2020  ? Palpitations 10/14/2019  ? Paroxysmal atrial fibrillation ([place]) 06/22/2019  ? Peripheral vascular disease ([place]) 12/04/2020  ? 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 Formatting of this note might be different from the original. 3 mm pituitary microadenoma. Likely nonfunctional.  ? PONV (postoperative nausea and vomiting)   ? RBBB (right bundle branch block) 10/14/2019  ? Recurrent major depression in full remission ([place]) 12/04/2020  ? Shortness of breath 06/08/2019  ? Sleep apnea   ? use CPAP  ? Stroke (cerebrum) ([place]) 11/21/2020  ?  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 Vitamin D deficiency 12/04/2020  ? Vomiting without nausea 12/04/2020  ?Questionable POTS ?Gastric banding surgery in [place] ? ?Family History:  ?Family History  ?Problem Relation Age of Onset  ? Colon cancer Mother 19  ? Colon polyps Mother 32  ? Thyroid cancer Father 25  ?     parathyroid CA  ? Colon polyps Sister   ? Colon polyps Brother   ? Colon polyps Sister   ? Colon polyps Sister   ? Colon polyps Sister   ? Esophageal cancer Neg Hx   ? Stomach cancer Neg Hx   ? Rectal cancer Neg Hx   ?History of clotting disorder/thrombophilia, unspecified ? ?Social History:  ?Patient lives in [place], [place] with his wife.  He denies alcohol use, tobacco use, illicit substance use, other supplementation use aside from above.  He manages his own ADLs as well as his own medications and telephone calls, otherwise IADLs are managed by his wife.   He spends most of his time sedentary, when he does ambulate he does so without assistive devices. ? ?Review of Systems: ?A complete ROS was negative except as per HPI.  ? ?Physical Exam: ?Blood pressure (!) 1

## 2022-02-09 NOTE — ED Triage Notes (Signed)
Pt bib [facility] from home d/t fall. Pt was walking out the door, felt lightheaded, fell landed on R side. Deformity noted on R arm. Pulses and sensations intact. Hx of POTS. VSS. A&O X4.  ? ?'100mg'$  Fentanyl given by EMS.  ?

## 2022-02-10 ENCOUNTER — Observation Stay (HOSPITAL_COMMUNITY): Payer: PPO

## 2022-02-10 DIAGNOSIS — I480 Paroxysmal atrial fibrillation: Secondary | ICD-10-CM

## 2022-02-10 DIAGNOSIS — D649 Anemia, unspecified: Secondary | ICD-10-CM | POA: Diagnosis not present

## 2022-02-10 DIAGNOSIS — R55 Syncope and collapse: Secondary | ICD-10-CM | POA: Diagnosis not present

## 2022-02-10 LAB — ECHOCARDIOGRAM COMPLETE
Area-P 1/2: 4.21 cm2
Calc EF: 54.7 %
S' Lateral: 4.2 cm
Single Plane A2C EF: 54.1 %
Single Plane A4C EF: 53.9 %

## 2022-02-10 LAB — COMPREHENSIVE METABOLIC PANEL
ALT: 23 U/L (ref 0–44)
AST: 62 U/L — ABNORMAL HIGH (ref 15–41)
Albumin: 2.2 g/dL — ABNORMAL LOW (ref 3.5–5.0)
Alkaline Phosphatase: 73 U/L (ref 38–126)
Anion gap: 3 — ABNORMAL LOW (ref 5–15)
BUN: 13 mg/dL (ref 8–23)
CO2: 23 mmol/L (ref 22–32)
Calcium: 8.3 mg/dL — ABNORMAL LOW (ref 8.9–10.3)
Chloride: 106 mmol/L (ref 98–111)
Creatinine, Ser: 1.18 mg/dL (ref 0.61–1.24)
GFR, Estimated: >60 mL/min (ref >60)
Glucose, Bld: 123 mg/dL — ABNORMAL HIGH (ref 70–99)
Potassium: 3.6 mmol/L (ref 3.5–5.1)
Sodium: 132 mmol/L — ABNORMAL LOW (ref 135–145)
Total Bilirubin: 0.7 mg/dL (ref 0.3–1.2)
Total Protein: 9 g/dL — ABNORMAL HIGH (ref 6.5–8.1)

## 2022-02-10 LAB — BPAM RBC
Blood Product Expiration Date: 202304122359
ISSUE DATE / TIME: 202304091811
Unit Type and Rh: 9500

## 2022-02-10 LAB — PATHOLOGIST SMEAR REVIEW

## 2022-02-10 LAB — TYPE AND SCREEN
ABO/RH(D): O NEG
Antibody Screen: NEGATIVE
Unit division: 0

## 2022-02-10 LAB — CBC
HCT: 23.7 % — ABNORMAL LOW (ref 39.0–52.0)
Hemoglobin: 7.7 g/dL — ABNORMAL LOW (ref 13.0–17.0)
MCH: 32 pg (ref 26.0–34.0)
MCHC: 32.5 g/dL (ref 30.0–36.0)
MCV: 98.3 fL (ref 80.0–100.0)
Platelets: 150 10*3/uL (ref 150–400)
RBC: 2.41 MIL/uL — ABNORMAL LOW (ref 4.22–5.81)
RDW: 22.1 % — ABNORMAL HIGH (ref 11.5–15.5)
WBC: 12 10*3/uL — ABNORMAL HIGH (ref 4.0–10.5)
nRBC: 1.2 % — ABNORMAL HIGH (ref 0.0–0.2)

## 2022-02-10 MED ORDER — MIDODRINE HCL 5 MG PO TABS
5.0000 mg | ORAL_TABLET | Freq: Three times a day (TID) | ORAL | Status: DC
  Administered 2022-02-11 – 2022-02-12 (×5): 5 mg via ORAL
  Filled 2022-02-10 (×5): qty 1

## 2022-02-10 MED ORDER — CYANOCOBALAMIN 1000 MCG/ML IJ SOLN
1000.0000 ug | Freq: Every day | INTRAMUSCULAR | Status: DC
  Administered 2022-02-10: 1000 ug via INTRAMUSCULAR
  Filled 2022-02-10 (×3): qty 1

## 2022-02-10 MED ORDER — PYRIDOSTIGMINE BROMIDE 60 MG PO TABS
30.0000 mg | ORAL_TABLET | Freq: Two times a day (BID) | ORAL | Status: DC
  Administered 2022-02-10 – 2022-02-12 (×4): 30 mg via ORAL
  Filled 2022-02-10 (×5): qty 0.5

## 2022-02-10 MED ORDER — HYDROMORPHONE HCL 1 MG/ML IJ SOLN
1.0000 mg | Freq: Once | INTRAMUSCULAR | Status: AC
  Administered 2022-02-10: 1 mg via INTRAVENOUS
  Filled 2022-02-10: qty 1

## 2022-02-10 MED ORDER — ONDANSETRON HCL 4 MG/2ML IJ SOLN
4.0000 mg | Freq: Once | INTRAMUSCULAR | Status: DC | PRN
Start: 2022-02-10 — End: 2022-02-12

## 2022-02-10 MED ORDER — MELATONIN 3 MG PO TABS
3.0000 mg | ORAL_TABLET | Freq: Once | ORAL | Status: AC
  Administered 2022-02-10: 3 mg via ORAL
  Filled 2022-02-10: qty 1

## 2022-02-10 NOTE — Evaluation (Signed)
Occupational Therapy Evaluation ?Patient Details ?Name: Lance Pham ?MRN: [id number] ?DOB: 01-31-1951 ?Today's Date: 02/10/2022 ? ? ?History of Present Illness 71 yo male adm 4/9 after fall at church with Rt humerus fx. PMHx:PAF on Eliquis, RBBB, HLD, HTN, orthostatic hypotension (2/2 dysautonomia in the context of COVID-19 severe infection fatigue on midodrine and florinef), OSA, embolic CVA without residual deficits, chronic IDA with macrocytosis, obesity s/p gastritic banding surgery  ? ?Clinical Impression ?  ?Pt is normally independent. He is limited in his walking distances by lightheadedness. Pt presents with mild shoulder pain, fatigue, lightheadedness in standing and nausea upon sitting on toilet and back into chair. RN notified and took BPs, reported to MD. Pt unable to tolerate standing BP. Began educating pt and wife in compensatory strategies for ADLs, positioning R UE in bed and chair and sling use. Will follow acutely. ?   ? ?Recommendations for follow up therapy are one component of a multi-disciplinary discharge planning process, led by the attending physician.  Recommendations may be updated based on patient status, additional functional criteria and insurance authorization.  ? ?Follow Up Recommendations ? Follow physician's recommendations for discharge plan and follow up therapies  ?  ?Assistance Recommended at Discharge Frequent or constant Supervision/Assistance  ?Patient can return home with the following A little help with walking and/or transfers;A little help with bathing/dressing/bathroom;Assistance with cooking/housework ? ?  ?Functional Status Assessment ? Patient has had a recent decline in their functional status and demonstrates the ability to make significant improvements in function in a reasonable and predictable amount of time.  ?Equipment Recommendations ? None recommended by OT  ?  ?Recommendations for Other Services   ? ? ?  ?Precautions / Restrictions Precautions ?Precautions:  Fall;Shoulder ?Type of Shoulder Precautions: no shoulder movement ?Shoulder Interventions: Shoulder sling/immobilizer;At all times;Off for dressing/bathing/exercises ?Precaution Booklet Issued: Yes (comment) ?Precaution Comments: watch BP ?Required Braces or Orthoses: Splint/Cast ?Splint/Cast: RUE splint with non-op humerus fx ?Restrictions ?Weight Bearing Restrictions: Yes ?RUE Weight Bearing: Non weight bearing  ? ?  ? ?Mobility Bed Mobility ?  ?  ?  ?  ?  ?  ?  ?  ?  ? ?Transfers ?Overall transfer level: Needs assistance ?  ?Transfers: Sit to/from Stand ?Sit to Stand: Supervision ?  ?  ?  ?  ?  ?General transfer comment: from chair and toilet ?  ? ?  ?Balance Overall balance assessment: No apparent balance deficits (not formally assessed) ?  ?  ?  ?  ?  ?  ?  ?  ?  ?  ?  ?  ?  ?  ?  ?  ?  ?  ?   ? ?ADL either performed or assessed with clinical judgement  ? ?ADL Overall ADL's : Needs assistance/impaired ?Eating/Feeding: Set up;Sitting ?  ?Grooming: Wash/dry hands;Sitting;Set up ?  ?Upper Body Bathing: Moderate assistance;Sitting ?  ?Lower Body Bathing: Moderate assistance;Sit to/from stand ?  ?Upper Body Dressing : Moderate assistance;Sitting ?  ?Lower Body Dressing: Minimal assistance;Sit to/from stand ?Lower Body Dressing Details (indicate cue type and reason): shorts with elastic waist ?Toilet Transfer: Ambulation;Regular Toilet;Grab bars;Min guard ?  ?Toileting- Clothing Manipulation and Hygiene: Minimal assistance;Sit to/from stand ?Toileting - Clothing Manipulation Details (indicate cue type and reason): performed pericare independently in sitting ?  ?  ?Functional mobility during ADLs: Min guard ?   ? ? ? ?Vision   ?   ?   ?Perception   ?  ?Praxis   ?  ? ?Pertinent  Vitals/Pain Pain Assessment ?Pain Assessment: Faces ?Faces Pain Scale: Hurts a little bit ?Pain Location: R UE ?Pain Descriptors / Indicators: Discomfort ?Pain Intervention(s): Monitored during session, Repositioned  ? ? ? ?Hand Dominance  Right ?  ?Extremity/Trunk Assessment Upper Extremity Assessment ?Upper Extremity Assessment: RUE deficits/detail ?RUE Deficits / Details: splinted humerus, full AROM elbow to hand ?RUE: Unable to fully assess due to immobilization ?RUE Coordination: decreased gross motor ?  ?Lower Extremity Assessment ?Lower Extremity Assessment: Defer to PT evaluation ?  ?Cervical / Trunk Assessment ?Cervical / Trunk Assessment: Normal ?  ?Communication Communication ?Communication: HOH ?  ?Cognition Arousal/Alertness: Awake/alert ?Behavior During Therapy: Flat affect ?Overall Cognitive Status: Within Functional Limits for tasks assessed ?  ?  ?  ?  ?  ?  ?  ?  ?  ?  ?  ?  ?  ?  ?  ?  ?General Comments: wife advocates for pt ?  ?  ?General Comments    ? ?  ?Exercises   ?  ?Shoulder Instructions    ? ? ?Home Living Family/patient expects to be discharged to:: Private residence ?Living Arrangements: Spouse/significant other ?Available Help at Discharge: Family;Available 24 hours/day ?Type of Home: House ?Home Access: Stairs to enter ?Entrance Stairs-Number of Steps: 1 ?  ?Home Layout: Two level;Able to live on main level with bedroom/bathroom ?  ?  ?Bathroom Shower/Tub: Walk-in shower ?  ?Bathroom Toilet: Handicapped height ?  ?  ?Home Equipment: Shower seat - built in;Rollator (4 wheels) ?  ?Additional Comments: lack of appetite post covid ?  ? ?  ?Prior Functioning/Environment Prior Level of Function : Independent/Modified Independent ?  ?  ?  ?  ?  ?  ?Mobility Comments: limited gait grossly 150' due to dizziness and prolonged weakness (covid x 3) ?  ?  ? ?  ?  ?OT Problem List: Decreased activity tolerance;Pain;Impaired UE functional use ?  ?   ?OT Treatment/Interventions:    ?  ?OT Goals(Current goals can be found in the care plan section) Acute Rehab OT Goals ?OT Goal Formulation: With patient ?Time For Goal Achievement: 02/24/22 ?Potential to Achieve Goals: Good ?ADL Goals ?Pt Will Perform Grooming: with  supervision;standing ?Pt Will Perform Upper Body Bathing: with min assist;sitting ?Pt Will Perform Upper Body Dressing: with modified independence;sitting ?Pt Will Transfer to Toilet: with modified independence;ambulating ?Pt Will Perform Toileting - Clothing Manipulation and hygiene: with modified independence;sit to/from stand ?Pt/caregiver will Perform Home Exercise Program: Increased ROM;Right Upper extremity;Independently (AROM R elbow to hand)  ?OT Frequency:   ?  ? ?Co-evaluation   ?  ?  ?  ?  ? ?  ?AM-PAC OT "6 Clicks" Daily Activity     ?Outcome Measure Help from another person eating meals?: A Little ?Help from another person taking care of personal grooming?: A Little ?Help from another person toileting, which includes using toliet, bedpan, or urinal?: A Little ?Help from another person bathing (including washing, rinsing, drying)?: A Lot ?Help from another person to put on and taking off regular upper body clothing?: A Lot ?Help from another person to put on and taking off regular lower body clothing?: A Little ?6 Click Score: 16 ?  ?End of Session Equipment Utilized During Treatment: Other (comment) (sling) ?Nurse Communication: Mobility status;Other (comment) (aware of pt's nausea and dizziness) ? ?Activity Tolerance: Treatment limited secondary to medical complications (Comment) (pt with lightheadedness and nausea after ambulation) ?Patient left: in chair;with call bell/phone within reach;with family/visitor present ? ?OT Visit Diagnosis: Pain;History  of falling (Z91.81) ?Pain - Right/Left: Right ?Pain - part of body: Shoulder  ?              ?Time: 4497-5300 ?OT Time Calculation (min): 42 min ?Charges:  OT General Charges ?$OT Visit: 1 Visit ?OT Evaluation ?$OT Eval Moderate Complexity: 1 Mod ?OT Treatments ?$Self Care/Home Management : 23-37 mins ? ?Nestor Lewandowsky, OTR/L ?Acute Rehabilitation Services ?Pager: [phone] ?Office: [phone]  ?Lance Pham ?02/10/2022, 1:48 PM ?

## 2022-02-10 NOTE — Progress Notes (Signed)
Pt has home CPAP set up at bedside and will self admin when ready. RT will cont to monitor as needed.  ?

## 2022-02-10 NOTE — Care Management Obs Status (Signed)
MEDICARE OBSERVATION STATUS NOTIFICATION ? ? ?Patient Details  ?Name: Lance Pham ?MRN: [id number] ?Date of Birth: May 03, 1951 ? ? ?Medicare Observation Status Notification Given:  Yes ? ? ? ?Pollie Friar, RN ?02/10/2022, 3:26 PM ?

## 2022-02-10 NOTE — Progress Notes (Addendum)
? ?Subjective: ? ?Patient is seen at bedside with his wife present. His right arm pain is moderately controlled on current regimen; he did require PRN dilaudid last night and had some increased anxiety due to pain. Endorsing some numbness with positional placement of arm, not currently present. ? ?He reports urinating well but has not yet had a BM. Wife reports ongoing diarrhea, and does not believe he needs a bowel regimen at this time.  ? ?Reports not ambulating 2/2 pain from arm injury. No SOB, chest pain, palpitations, or dizziness/lightheadedness while supine. Discussed need for further work-up. Patient agreeable, all questions answered. ? ?Objective: ? ?Vital signs in last 24 hours: ?Vitals:  ? 02/09/22 1933 02/09/22 2034 02/09/22 2258 02/10/22 0559  ?BP: (!) 178/78 (!) 173/79 (!) 179/82 (!) 159/73  ?Pulse: (!) 58 64 65 60  ?Resp: 18 18 18 18   ?Temp: 98.4 ?F (36.9 ?C) 98.2 ?F (36.8 ?C) 98.1 ?F (36.7 ?C) 97.9 ?F (36.6 ?C)  ?TempSrc: Oral Oral Oral Oral  ?SpO2:    95%  ? ?Weight change:  ? ?Intake/Output Summary (Last 24 hours) at 02/10/2022 1051 ?Last data filed at 02/09/2022 2104 ?Gross per 24 hour  ?Intake 570 ml  ?Output 500 ml  ?Net 70 ml  ? ?Physical Exam: ?Constitutional: Elderly caucasian male laying comfortably in bed, conversant, NAD ?HEENT: atraumatic, normocephalic, moist mucus membranes ?Cardio: Normal rate and rhythm, no murmurs  ?Pulm: CTAB, no increased work of breathing on room air, no crackles or wheezes ?Abd: soft, nondistended, nontender, + bowel sounds ?Extremities: right arm in splint, no numbness to R fingers or arm, normal pulse, no edema to bilateral LE, normal skin turgor ?Skin: warm and dry ?Neuro: Alert and oriented x3, answering questions appropriately ?Psych: normal affect ? ?Assessment/Plan: ? ?Principal Problem: ?  Symptomatic anemia ? 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 ?  ?#Presyncopal episode, multifactorial ?Presyncopal episode likely multifactorial in etiology. Patient with long history of presyncopal episodes acutely worsened in the last few months and on midodrine BID and Florinef daily for suspected orthostatic hypotension secondary to COVID related dysautonomia. Patient is euvolemic on exam and does not endorse SOB, chest pain, palpitations, or dizziness/lightheadedness while supine. Orthostatics pending. Patient seen by PT who ambulated patient, ambulation limited by dizziness with HR 55-70, recommending OP PT. Plan to obtain orthostatic vitals today. TTE this morning showed no significant changes compared to previous on 11/2020, do not think cardiac pathology currently contributing to presentation. Given evidence of RBBB and LAFB on admission EKG, he should follow up with outpatient provider once discharged to determine need for possible pacemaker. There was some concern for UTI related orthostatic hypotension given recent admission 02/23 for similar reason, however UA nonconcerning for infection. Hgb only mildly improved post 1u pRBC, today at 7.7. Macrocytic anemia as below, likely contributing to symptoms.   ?Plan: ?-Consult to cardiology, appreciate recommendations ?-Cardiac monitoring ?-Orthostatic vitals ?-Continue midodrine 10 mg BID, Florinef 0.1 mg daily ?-PT/OT eval and treat ?  ?#Symptomatic acute on chronic hypo-proliferative macrocytic anemia ?#History of iron deficiency anemia ?#Hx of B12 deficiency ?Hgb only mildly improved post 1u pRBC  yesterday, today at 7.7. His retic index is 0.53, indicating hypoproliferation of RBC, an  inappropriate response given anemia. Given low-normal B12, will get MMA level to rule out B12 deficiency and repleat today. Copper deficiency remains on the differential given hx of Lap-Band surgery, Copper level pending. Patient admitted with humerus fracture due to ground level fall with x-ray concerning for lytic lesion. Given these findings and low retic index, macrocytic anemia could be secondary to Multiple Myeloma. He does have protein gap but kidney function and calcium are wnl. SPEP and UPEP are pending, will obtain bone survey for lytic lesions. Blood smear shows unremarkable pathology, but final read still pending. UA with Hgb no Rbcs. Doubt acute blood loss from GI, GU source at this time. Next CBC tm morning.    ?Plan: ?-Post 1 unit PRBCs ?-Skeletal survey ?-Follow-up Blood smear final read ?-Follow-up copper ?-Follow-up MMA ?-AM CBC ?-Follow-up SPEP, UPEP, immunofixation ?-On folic acid supplementation ?-Begin B12 IM supplementation ?-Zofran once PRN for nausea ?  ?#Angulated and displaced fracture of the mid humeral diaphysis ?Ground-level fall, corrected calcium normal at 9.4.  Orthopedics consulted in the ED.  Splint applied by [facility]. Ortho will proceed with non-operative management, outpatient follow up with Dr. Marlou Sa after discharge. Pain currently adequately managed. Patient denies numbness, tingling, pain in his distal RUE, low concern for compartment syndrome.  ?Plan: ?-Orthopedics following, appreciate recommendations ?-Splint in place ?-Tylenol 1000 mg TID scheduled ?-Oxycodone 5 mg every 6 hours as needed ?-Can get additional dose of dilaudid if needed ?  ?#Paroxysmal atrial fibrillation ?#History of embolic CVA ?Patient remains in NSR. Will continue home Eliquis 5 mg twice daily and cardiac monitoring. ?  ?#Depression ?Continued on home Prozac 40 mg daily ?  ?Diet: Regular  ?VTE: Eliquis ?IVF: None ?Code: Full ? ? LOS: 0 days  ? ?Rodriguez-Teodoro, Quintella Reichert, Medical Student ?02/10/2022,  10:51 AM  ? ?Attestation for Student Documentation: ? ?I personally was present and performed or re-performed the history, physical exam and medical decision-making activities of this service and have verified that the service and findings are accurately documented in the student?s note. ? ?Wayland Denis, MD ?02/10/22,  1:35 PM ?Pager: [phone] ?Internal Medicine Resident, PGY-1 ?[facility]  ?  ?Please contact the on call pager after 5 pm and on weekends at [phone]. ? ? ?

## 2022-02-10 NOTE — Progress Notes (Signed)
Right humeral shaft fracture transverse with some displacement is noted in a patient who sustained a fall at home yesterday ?Currently in coaptation splint ?Radial nerve intact by report ?Plan initial course of nonop treatment; however, the transverse nature of this fracture may lead to nonunion which would require ORIF. ?Full consult to follow ?

## 2022-02-10 NOTE — Consult Note (Addendum)
?Cardiology Consultation:  ? ?Patient ID: Lance Pham ?MRN: [id number]; DOB: 03-Jan-1951 ? ?Admit date: 02/09/2022 ?Date of Consult: 02/10/2022 ? ?PCP:  Bonnita Nasuti, MD ?  ?[facility] ?Cardiologist:  Shirlee More, MD     ? ? ?Patient Profile:  ? ?Lance Pham is a 71 y.o. 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 Dareen Piano. ? ?History of Present Illness:  ? ?Lance Pham is a 71 year old male with above medical history who is followed by Dr. Bettina Gavia. Per chart review, patient developed chest discomfort in August 2019. Underwent a myocardial perfusion scan in 06/2018 that suggested a fixed anteroseptal defect without correlating wall motion abnormality. Patient did not have further episodes of chest pain, but he did have dyspnea on exertion. Underwent LHC on 06/15/2019 that showed distal LAD disease that was no amenable to intervention, and nonobstructive disease otherwise During the procedure, patient developed jaw pain, hypertension. Later developed atrial fibrillation. Converted to sinus rhythm following LHC.  ? ?In a follow up appointment in 10/2019, patient admitted to frequent episodes of lightheadedness and palpitations. Wore a 7 day monitor hat showed PACs, PVCs, a run of wide-complex tachycardia, and 1 run of a junctional rhythm. Patient saw an electrophysiologist who recommended sleep study, but no further workup.  ? ?Patient admitted to [facility] in 02/980 for embolic stroke, thought to be caused by atrial fibrillation. Started on eliquis. Echocardiogram 11/22/20 showed mild LVH, LVEF 55-60%.  ? ?At a cardiology visit in 12/2020, it was noted that patient had symptomatic hypotension. Thought to be caused by over treating his hypertension. Patient was  later hospitalized for COVID-19 infection, in 06/2021. Noted to have orthostatic hypotension that continued after he was discharged. 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 Last seen by cardiology on 09/03/21, at that appointment he was doing well and his orthostatic hypotension was improving.  ? ?Patient presented to the ED on 4/9 following a ground level fall onto his right arm. Prior to fall, patient became lightheaded. Labs in the ED showed Na 134, K 4.6, creatinine 1.19, albumin 2.2, WBC 10.8, hemoglobin 7.2, platelets 170.  ?EKG showed sinus rhythm, HR 58, RBBB, LAFB. (RBBB and LAFB chronic, noted in 11/2020) ?CT cervical spine showed no acute intracranial abnormalities, no cervical spine fractures or subluxations ?CT head showed no acute intracranial abnormality  ?XR right humerous showed a comminuted, angulated, and displaced fracture of the mid humeral diaphysis  ?Echocardiogram showed EF 55-60%, grade II diastolic dysfunction, normal RV systolic function, moderately dilated LA,. No significant changes when compared to echo from 11/2020.  ? ?On interview, patient reports that he has had dizziness upon standing for over 10 years. Denies chest pain, denies palpitations. Does not believe he had LOC, but felt like he was going to pass out after standing up quickly at church.  Symptoms present almost every day. Patient reports drinking a lot of water, but wife believes that he does not drink enough and that his urine is usually orange. Patient denies other symptoms of autonomic dysfunction (being cold all of the time, constipation, etc)  ? ? ? ?Past Medical History:  ?Diagnosis Date  ? A-fib ([place])   ? hx of  ? Abnormal myocardial perfusion study  07/14/2018  ? Acute intermittent porphyria ([place]) 12/04/2020  ? Acute upper respiratory infection 12/04/2020  ? Allergic rhinitis 12/04/2020  ? Arthritis   ? RIGHT knee  ? BBB (bundle branch block)   ? hx of R BBB  ?  Benign prostatic hyperplasia with lower urinary tract symptoms 12/04/2020  ? Binge eating disorder 12/04/2020  ? CAD in native artery 06/22/2019  ? Cataract   ? bilateral sx  ? Chest discomfort 07/14/2018  ? Chill 12/04/2020  ? Clotting disorder ([place])   ? has a genetic clotting dx -unknown name  ? Constipation 12/04/2020  ? 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[place]) 12/04/2020  ? Cutaneous abscess 12/04/2020  ? Dyslipidemia 07/14/2018  ? Episodic lightheadedness 10/14/2019  ? Essential hypertension 07/14/2018  ? Fever 12/04/2020  ? Gastro-esophageal reflux disease without esophagitis 12/04/2020  ? Generalized anxiety disorder 12/04/2020  ? Hyperglycemia due to type 2 diabetes mellitus ([place]) 12/04/2020  ? 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 Hypertension   ? on meds  ? Hypothyroidism 12/04/2020  ? Iron deficiency 12/04/2020  ? 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 Mixed hyperlipidemia 12/04/2020  ? Moderate recurrent major depression ([place]) 12/04/2020  ? Myopathy 12/04/2020  ? Other long term (current) drug therapy 12/04/2020  ? Other vitamin B12 deficiency anemias 12/04/2020  ? Overactive bladder 12/04/2020  ? Palpitations 10/14/2019  ? Paroxysmal atrial fibrillation ([place]) 06/22/2019  ? Peripheral vascular disease ([place]) 12/04/2020  ? 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 Formatting of this note might be different from the original. 3 mm pituitary microadenoma. Likely nonfunctional.  ? PONV (postoperative nausea and vomiting)   ? RBBB (right bundle branch block) 10/14/2019  ? Recurrent major depression in full remission ([place]) 12/04/2020  ? Shortness of breath 06/08/2019  ? Sleep apnea   ? use CPAP  ? Stroke (cerebrum) ([place]) 11/21/2020  ? 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 Vitamin D deficiency 12/04/2020  ? Vomiting without nausea 12/04/2020  ? ? ?Past Surgical History:  ?Procedure Laterality Date  ? CATARACT EXTRACTION, BILATERAL    ? CHOLECYSTECTOMY    ? COLONOSCOPY  2017  ? RG-mag citrtae (good)-TICS/TA x 1  ? FOOT SURGERY  Left   ? tendon link  ? HAMMER TOE SURGERY    ? KNEE SURGERY Right   ? LAPAROSCOPIC GASTRIC BANDING    ? POLYPECTOMY  2017  ? TA x 1  ? WISDOM TOOTH EXTRACTION    ? WRIST RECONSTRUCTION Right   ?  ? ?Home Medications:  ?Prior to Admission medications   ?Medication Sig Start Date End Date Taking? Authorizing Provider  ?acetaminophen (TYLENOL) 500 MG tablet Take 1,000 mg by mouth every 6 (six) hours as needed for mild pain or headache.   Yes [provider]  ?albuterol (VENTOLIN HFA) 108 (90 Base) MCG/ACT inhaler Inhale 1 puff into the lungs every 4 (four) hours as needed for wheezing or shortness of breath.   Yes [provider]  ?apixaban (ELIQUIS) 5 MG TABS tablet Take 5 mg by mouth 2 (two) times daily.   Yes [provider]  ?benzonatate (TESSALON) 200 MG capsule Take 200 mg by mouth 3 (three) times daily as needed for cough. 01/27/22  Yes [provider]  ?buPROPion (WELLBUTRIN SR) 150 MG 12 hr tablet Take 150 mg by mouth daily. 10/12/21  Yes [provider]  ?cyanocobalamin (,VITAMIN B-12,) 1000 MCG/ML injection Inject 1,000 mg into the muscle every 30 (thirty) days. 11/23/20  Yes [provider]  ?ferrous  sulfate 324 (65 Fe) MG TBEC Take 324 mg by mouth daily after supper.   Yes [provider]  ?fludrocortisone (FLORINEF) 0.1 MG tablet Take 1 tablet (0.1 mg total) by mouth daily. 07/03/21  Yes Richardo Priest, MD  ?FLUoxetine (PROZAC) 40 MG capsule Take 40 mg by mouth at bedtime. 01/16/20  Yes [provider]  ?folic acid (FOLVITE) 454 MCG tablet Take 400 mcg by mouth at bedtime.   Yes [provider]  ?losartan (COZAAR) 50 MG tablet Take 50 mg by mouth daily. 08/19/21  Yes [provider]  ?midodrine (PROAMATINE) 10 MG tablet Take 1 tablet (10 mg total) by mouth 2 (two) times daily. 09/03/21  Yes Richardo Priest, MD  ?testosterone cypionate (DEPOTESTOSTERONE CYPIONATE) 200 MG/ML injection Inject 200 mg into the muscle once a  week. Thursday's 08/08/21  Yes [provider]  ? ? ?Inpatient Medications: ?Scheduled Meds: ? acetaminophen  1,000 mg Oral TID  ? apixaban  5 mg Oral BID  ? cyanocobalamin  1,000 mcg Intramuscular

## 2022-02-10 NOTE — TOC Initial Note (Signed)
Transition of Care (TOC) - Initial/Assessment Note  ? ? ?Patient Details  ?Name: Lance Pham ?MRN: [id number] ?Date of Birth: [birth year] ? ?Transition of Care (TOC) CM/SW Contact:    ?Pollie Friar, RN ?Phone Number: ?02/10/2022, 3:28 PM ? ?Clinical Narrative:                 ?Patient is from home with his spouse. Spouse works M-F 9 am -5 pm. Pt states he has gone through a broken wrist in the past and has managed at home and continued to drive.  ?He denies any issues with his home medications.  ?Recommendations for outpatient therapy. Pt prefers to attend in [place]. CM will arrange and provide information on the AVS. ?TOC following. ? ?Expected Discharge Plan: OP Rehab ?Barriers to Discharge: Continued Medical Work up ? ? ?Patient Goals and CMS Choice ?  ?  ?Choice offered to / list presented to : Patient ? ?Expected Discharge Plan and Services ?Expected Discharge Plan: OP Rehab ?  ?Discharge Planning Services: CM Consult ?  ?Living arrangements for the past 2 months: [place] ?                ?  ?  ?  ?  ?  ?  ?  ?  ?  ?  ? ?Prior Living Arrangements/Services ?Living arrangements for the past 2 months: [place] ?Lives with:: Spouse ?Patient language and need for interpreter reviewed:: Yes ?Do you feel safe going back to the place where you live?: Yes      ?  ?  ?Current home services: DME (built in shower seat) ?Criminal Activity/Legal Involvement Pertinent to Current Situation/Hospitalization: No - Comment as needed ? ?Activities of Daily Living ?  ?  ? ?Permission Sought/Granted ?  ?  ?   ?   ?   ?   ? ?Emotional Assessment ?Appearance:: Appears stated age ?Attitude/Demeanor/Rapport: Engaged ?Affect (typically observed): Accepting ?Orientation: : Oriented to Self, Oriented to Place, Oriented to  Time, Oriented to Situation ?  ?Psych Involvement: No (comment) ? ?Admission diagnosis:  Symptomatic anemia [D64.9] ?Patient Active Problem List  ? Diagnosis Date Noted  ? Symptomatic anemia  02/09/2022  ? COVID-19   ? Sleep apnea   ? PONV (postoperative nausea and vomiting)   ? Hypertension   ? BBB (bundle branch block)   ? Cataract   ? Clotting disorder ([place])   ? Arthritis   ? A-fib ([place])   ? Acute intermittent porphyria ([place]) 12/04/2020  ? Acute upper respiratory infection 12/04/2020  ? Allergic rhinitis 12/04/2020  ? Benign prostatic hyperplasia with lower urinary tract symptoms 12/04/2020  ? Constipation 12/04/2020  ? 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[place]) 12/04/2020  ? Cutaneous abscess 12/04/2020  ? Gastro-esophageal reflux disease without esophagitis 12/04/2020  ? Generalized anxiety disorder 12/04/2020  ? Hyperglycemia due to type 2 diabetes mellitus ([place]) 12/04/2020  ? Hypothyroidism 12/04/2020  ? Iron deficiency 12/04/2020  ? 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 Mixed hyperlipidemia 12/04/2020  ? Moderate recurrent major depression ([place]) 12/04/2020  ? Myopathy 12/04/2020  ? Other long term (current) drug therapy 12/04/2020  ? Other vitamin B12 deficiency anemias 12/04/2020  ? Overactive bladder 12/04/2020  ? Peripheral vascular disease ([place]) 12/04/2020  ? Recurrent major depression in full remission ([place]) 12/04/2020  ? 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 Vitamin D deficiency 12/04/2020  ? Stroke (cerebrum) ([place]) 11/21/2020  ? Episodic lightheadedness 10/14/2019  ? 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 Palpitations 10/14/2019  ?  RBBB (right bundle branch block) 10/14/2019  ? CAD in native artery 06/22/2019  ? Paroxysmal atrial fibrillation ([place]) 06/22/2019  ? Shortness of breath 06/08/2019  ? Abnormal myocardial perfusion study 07/14/2018  ? Chest discomfort 07/14/2018  ? Dyslipidemia 07/14/2018  ? Essential hypertension 07/14/2018  ? 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 ?PCP:  Bonnita Nasuti, MD ?Pharmacy:   ?[facility], [place] [address] ?[address] ?[place]  [address] ?Phone: [phone] Fax: [phone] ? ? ? ? ?Social Determinants of Health (SDOH) Interventions ?  ? ?Readmission Risk Interventions ?   ? View : No data to display.  ?  ?  ?  ? ? ? ?

## 2022-02-10 NOTE — TOC CAGE-AID Note (Addendum)
Transition of Care (TOC) - CAGE-AID Screening ? ? ?Patient Details  ?Name: Lance Pham ?MRN: [id number] ?Date of Birth: Sep 29, 1951 ? ?Transition of Care (TOC) CM/SW Contact:    ?Travarius Lange C Tarpley-Carter, LCSWA ?Phone Number: ?02/10/2022, 1:18 PM ? ? ?Clinical Narrative: ?Pt is unable to participate in Cage Aid. ?Pt currently is not in room.  CSW will assess at a better time. ? ?Passenger transport manager, MSW, LCSW-A ?Pronouns:  She/Her/Hers ?[facility] of [facility] ?Clinical Social Worker ?Direct Number:  [phone] ?[email or web address]  ? ?CAGE-AID Screening: ?Substance Abuse Screening unable to be completed due to: : Patient unable to participate ? ?  ?  ?  ?  ?  ? ?  ? ?  ? ? ? ? ? ? ?

## 2022-02-10 NOTE — Progress Notes (Signed)
Internal Medicine Attending:  ? ?I saw and examined the patient. I reviewed the resident?s H&P note and I agree with the resident?s findings and plan as documented in the resident?s note. ? ?In brief, patient is a 71-year-old male with a past medical history of paroxysmal A-fib on Eliquis, right bundle branch block/left anterior fascicular block, hyperlipidemia, hypertension, orthostatic hypotension secondary to dysautonomia from COVID-19 infection, OSA, embolic stroke, anemia, obesity status post gastric banding who presented to the ED with right arm and shoulder pain after a fall in the setting of presyncopal symptoms.  Patient does report a chronic history of orthostasis and is on midodrine and Florinef for this.  States that he did not take these medications prior to the church service as it was an early service.  Patient got up from a seated position and felt lightheaded and dizzy and fell into the pew before he was able to catch himself.  No LOC, no head trauma, no fevers or chills, no abdominal pain, no nausea or vomiting, no diarrhea.  In the ED, patient was found to have worsening anemia with a hemoglobin of 7.2 as well as a mildly comminuted, angulated and displaced fracture of the mid humeral diaphysis and was admitted for further evaluation. ? ?Today, patient states that he has persistent pain in the right arm but that this is controlled on current pain regimen.  Denies any other complaints at this time.  On exam, patient is in no apparent distress.  Right upper extremity is in a sling.  Lungs are clear to auscultation bilaterally.  Cardiovascular exam reveals regular rate and rhythm with normal heart sounds.  Patient has trace bilateral lower extremity edema on exam which is nontender to palpation.  Patient is currently oriented x3 with a normal mood and affect. ? ?Patient was admitted to the hospital for work-up of his presyncopal symptoms as well as right humeral fracture.  The etiology behind his  presyncope is likely multifactorial in the setting of worsening anemia (hemoglobin 7.2), chronic orthostasis with medication nonadherence as well as possible conduction abnormality given his right bundle branch block/left anterior fascicular block on EKG.  We will continue with cardiac monitoring for now.  2D echo remains unchanged from prior.  Would consider EP work-up in the setting of conduction defect as well as presyncopal event.  Will discuss with cardiology.  We will continue Florinef and midodrine for now.  PT/OT to follow-up. ? ?Etiology behind the patient's chronic anemia remains uncertain at this time.  His iron studies are within normal limits and his B12 and folate levels were normal as well.  Although his B12 levels are low normal and he would benefit from B12 supplementation especially given his history of gastric bypass.  It is also possible the patient has a copper deficiency given his history of gastric bypass which would cause macrocytic anemia as well.  We will obtain copper levels.  There is also concern for possible underlying multiple myeloma.  X-ray showed possible lytic lesion at the site of the fracture and patient has a decreased anion gap as well as an elevated total protein of 9 and a decreased albumin of 2.2 concerning for possible underlying myeloma.  Patient also has decreased reticulocyte count for the degree of anemia he has which is also concerning for underlying myeloma.  We will follow-up SPEP, quantitative immunoglobulins and urine light chain as well as a skeletal survey to look for lytic lesions in his bones.  We will monitor CBC daily.    We will transfuse for hemoglobin less than 7.  No further work-up at this time ? ?Ortho follow-up recommendations appreciated.  Plan is for initial nonoperative management with splint and nonweightbearing of his right arm.  Patient will have close follow-up with Dr. Dean and orthopedics and will set up an appointment to follow-up with him in 1  to 2 weeks. ?

## 2022-02-10 NOTE — Consult Note (Signed)
Reason for Consult:Right humerus fx ?Referring Physician: Aldine Contes ?Time called: 1043 ?Time at bedside: 1143 ? ? ?Lance Pham is an 71 y.o. male.  ?HPI: [place] was at church when he became lightheaded and fell into a pew. He hit his upper arm on the lip of the pew and had immediate pain. He was brought to the ED where x-rays showed a right humerus fx. He was splinted and orthopedic surgery was consulted. He is RHD. ? ?Past Medical History:  ?Diagnosis Date  ? A-fib ([place])   ? hx of  ? Abnormal myocardial perfusion study 07/14/2018  ? Acute intermittent porphyria ([place]) 12/04/2020  ? Acute upper respiratory infection 12/04/2020  ? Allergic rhinitis 12/04/2020  ? Arthritis   ? RIGHT knee  ? BBB (bundle branch block)   ? hx of R BBB  ? Benign prostatic hyperplasia with lower urinary tract symptoms 12/04/2020  ? Binge eating disorder 12/04/2020  ? CAD in native artery 06/22/2019  ? Cataract   ? bilateral sx  ? Chest discomfort 07/14/2018  ? Chill 12/04/2020  ? Clotting disorder ([place])   ? has a genetic clotting dx -unknown name  ? Constipation 12/04/2020  ? 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 Cutaneous abscess 12/04/2020  ? Dyslipidemia 07/14/2018  ? Episodic lightheadedness 10/14/2019  ? Essential hypertension 07/14/2018  ? Fever 12/04/2020  ? Gastro-esophageal reflux disease without esophagitis 12/04/2020  ? Generalized anxiety disorder 12/04/2020  ? Hyperglycemia due to type 2 diabetes mellitus ([place]) 12/04/2020  ? 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 Hypertension   ? on meds  ? Hypothyroidism 12/04/2020  ? Iron deficiency 12/04/2020  ? 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 Mixed hyperlipidemia 12/04/2020  ? 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 Myopathy 12/04/2020  ? Other long term (current) drug therapy 12/04/2020  ? Other vitamin B12 deficiency anemias 12/04/2020  ? Overactive bladder 12/04/2020  ? Palpitations 10/14/2019  ? Paroxysmal atrial fibrillation ([place]) 06/22/2019  ?  Peripheral vascular disease ([place]) 12/04/2020  ? Pituitary microadenoma ([place]) 06/02/2017  ? Formatting of this note might be different from the original. 3 mm pituitary microadenoma. Likely nonfunctional.  ? PONV (postoperative nausea and vomiting)   ? RBBB (right bundle branch block) 10/14/2019  ? Recurrent major depression in full remission ([place]) 12/04/2020  ? Shortness of breath 06/08/2019  ? Sleep apnea   ? use CPAP  ? 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 Vitamin D deficiency 12/04/2020  ? Vomiting without nausea 12/04/2020  ? ? ?Past Surgical History:  ?Procedure Laterality Date  ? CATARACT EXTRACTION, BILATERAL    ? CHOLECYSTECTOMY    ? COLONOSCOPY  2017  ? RG-mag citrtae (good)-TICS/TA x 1  ? FOOT SURGERY Left   ? tendon link  ? HAMMER TOE SURGERY    ? KNEE SURGERY Right   ? LAPAROSCOPIC GASTRIC BANDING    ? POLYPECTOMY  2017  ? TA x 1  ? WISDOM TOOTH EXTRACTION    ? WRIST RECONSTRUCTION Right   ? ? ?Family History  ?Problem Relation Age of Onset  ? Colon cancer Mother 14  ? Colon polyps Mother 61  ? Thyroid cancer Father 77  ?     parathyroid CA  ? Colon polyps Sister   ? Colon polyps Brother   ? Colon polyps Sister   ? Colon polyps Sister   ? Colon polyps Sister   ? Esophageal cancer Neg Hx   ?  Stomach cancer Neg Hx   ? Rectal cancer Neg Hx   ? ? ?Social History:  reports that he has never smoked. He has never used smokeless tobacco. He reports that he does not drink alcohol and does not use drugs. ? ?Allergies:  ?Allergies  ?Allergen Reactions  ? Nsaids Other (See Comments)  ?  Patient had a Lap band placed is suppose to have NO NSAIDS due to the possibility of esophageal erosion leading to band "coming through"  ? Tamiflu [Oseltamivir] Nausea Only and Other (See Comments)  ?  Stomach upset  ? ? ?Medications: I have reviewed the patient's current medications. ? ?Results for orders placed or performed during the hospital encounter of 02/09/22 (from the past 48 hour(s))  ?CBC with  Differential     Status: Abnormal  ? Collection Time: 02/09/22 11:33 AM  ?Result Value Ref Range  ? WBC 10.8 (H) 4.0 - 10.5 K/uL  ? RBC 2.28 (L) 4.22 - 5.81 MIL/uL  ? Hemoglobin 7.2 (L) 13.0 - 17.0 g/dL  ? HCT 24.6 (L) 39.0 - 52.0 %  ? MCV 107.9 (H) 80.0 - 100.0 fL  ? MCH 31.6 26.0 - 34.0 pg  ? MCHC 29.3 (L) 30.0 - 36.0 g/dL  ? RDW 21.2 (H) 11.5 - 15.5 %  ? Platelets 170 150 - 400 K/uL  ? nRBC 1.2 (H) 0.0 - 0.2 %  ? Neutrophils Relative % 25 %  ? Neutro Abs 2.7 1.7 - 7.7 K/uL  ? Lymphocytes Relative 69 %  ? Lymphs Abs 7.5 (H) 0.7 - 4.0 K/uL  ? Monocytes Relative 5 %  ? Monocytes Absolute 0.5 0.1 - 1.0 K/uL  ? Eosinophils Relative 0 %  ? Eosinophils Absolute 0.0 0.0 - 0.5 K/uL  ? Basophils Relative 1 %  ? Basophils Absolute 0.1 0.0 - 0.1 K/uL  ? Abs Immature Granulocytes 0.00 0.00 - 0.07 K/uL  ?  Comment: Performed at [facility], [employer] [address], [place], [place] [address]  ?Comprehensive metabolic panel     Status: Abnormal  ? Collection Time: 02/09/22 11:33 AM  ?Result Value Ref Range  ? Sodium 134 (L) 135 - 145 mmol/L  ? Potassium 4.6 3.5 - 5.1 mmol/L  ? Chloride 108 98 - 111 mmol/L  ? CO2 21 (L) 22 - 32 mmol/L  ? Glucose, Bld 113 (H) 70 - 99 mg/dL  ?  Comment: Glucose reference range applies only to samples taken after fasting for at least 8 hours.  ? BUN 14 8 - 23 mg/dL  ? Creatinine, Ser 1.19 0.61 - 1.24 mg/dL  ? Calcium 8.0 (L) 8.9 - 10.3 mg/dL  ? Total Protein 8.8 (H) 6.5 - 8.1 g/dL  ? Albumin 2.2 (L) 3.5 - 5.0 g/dL  ? AST 76 (H) 15 - 41 U/L  ? ALT 21 0 - 44 U/L  ? Alkaline Phosphatase 72 38 - 126 U/L  ? Total Bilirubin 1.1 0.3 - 1.2 mg/dL  ? GFR, Estimated >60 >60 mL/min  ?  Comment: (NOTE) ?Calculated using the CKD-EPI Creatinine Equation (2021) ?  ? Anion gap 5 5 - 15  ?  Comment: Performed at [facility], [place] [address] [place], [place] [address]  ?Vitamin B12     Status: None  ? Collection Time: 02/09/22 12:34 PM  ?Result Value Ref Range  ? Vitamin B-12 270 180 - 914 pg/mL  ?  Comment:  (NOTE) ?This assay is not validated for testing neonatal or ?myeloproliferative syndrome specimens  for Vitamin B12 levels. ?Performed at [facility], [place] [address] [place], [place] ?[address] ?  ?Folate     Status: None  ? Collection Time: 02/09/22 12:34 PM  ?Result Value Ref Range  ? Folate 33.0 >5.9 ng/mL  ?  Comment: RESULTS CONFIRMED BY MANUAL DILUTION ?Performed at [facility], [place] [address], [place], [place] [address] ?  ?Iron and TIBC     Status: None  ? Collection Time: 02/09/22 12:34 PM  ?Result Value Ref Range  ? Iron 125 45 - 182 ug/dL  ? TIBC 343 250 - 450 ug/dL  ? Saturation Ratios 36 17.9 - 39.5 %  ? UIBC 218 ug/dL  ?  Comment: Performed at [facility], [place] [address] [place], [place] [address]  ?Ferritin     Status: None  ? Collection Time: 02/09/22 12:34 PM  ?Result Value Ref Range  ? Ferritin 332 24 - 336 ng/mL  ?  Comment: Performed at [facility], [place] [address], [place], [place] [address]  ?Type and screen [place]     Status: None  ? Collection Time: 02/09/22 12:46 PM  ?Result Value Ref Range  ? ABO/RH(D) O NEG   ? Antibody Screen NEG   ? Sample Expiration 02/12/2022,2359   ? Unit Number [id number]   ? Blood Component Type RBC LR PHER1   ? Unit division 00   ? Status of Unit ISSUED,FINAL   ? Transfusion Status OK TO TRANSFUSE   ? Crossmatch Result    ?  Compatible ?Performed at [facility], [place] [address] [place], [place] [address] ?  ?POC occult blood, ED     Status: None  ? Collection Time: 02/09/22 12:53 PM  ?Result Value Ref Range  ? Fecal Occult Bld NEGATIVE NEGATIVE  ?Prepare RBC (crossmatch)     Status: None  ? Collection Time: 02/09/22 12:53 PM  ?Result Value Ref Range  ? Order Confirmation    ?  ORDER PROCESSED BY BLOOD BANK ?Performed at [facility], [place] [address] [place], [place] [address] ?  ?Urinalysis, Routine w reflex microscopic Urine, Clean Catch     Status: Abnormal  ? Collection Time: 02/09/22  2:52 PM   ?Result Value Ref Range  ? Color, Urine AMBER (A) YELLOW  ?  Comment: BIOCHEMICALS MAY BE AFFECTED BY COLOR  ? APPearance CLOUDY (A) CLEAR  ? Specific Gravity, Urine 1.017 1.005 - 1.030  ? pH 5.0 5.0 - 8.0

## 2022-02-10 NOTE — Progress Notes (Signed)
?  Echocardiogram ?2D Echocardiogram has been performed. ? ?Lance Pham ?02/10/2022, 8:41 AM ?

## 2022-02-10 NOTE — Evaluation (Signed)
Physical Therapy Evaluation ?Patient Details ?Name: Lance Pham ?MRN: [id number] ?DOB: 24-Nov-1950 ?Today's Date: 02/10/2022 ? ?History of Present Illness ? 71 yo male adm 4/9 after fall at church with Rt humerus fx. 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 Pt pleasant with wife present throughout session and reports continued decline in functional tolerance since having Covid x 3 and remains without sense of smell or taste and no appetite. Pt with HR 55-70 during session with drops in HR at rest and with standing. Pt reports frequent orthostatic hypotension mostly exhibited with static standing and limiting his activity tolerance due to fear of syncope. Pt encouraged to try compression stockings and use of rollator (once arm healed) to allow increased safety and security in the event of hypotension with mobility. Pt with decreased activity tolerance this session due to dizziness and pain. Pt will benefit from acute therapy to maximize independence and function for return home with wife. ? ?SpO2 93-97% on RA ?HR 55-70   ?   ? ?Recommendations for follow up therapy are one component of a multi-disciplinary discharge planning process, led by the attending physician.  Recommendations may be updated based on patient status, additional functional criteria and insurance authorization. ? ?Follow Up Recommendations Outpatient PT ? ?  ?Assistance Recommended at Discharge Intermittent Supervision/Assistance  ?Patient can return home with the following ? A little help with bathing/dressing/bathroom;Assistance with cooking/housework;Direct supervision/assist for medications management;A little help with walking and/or transfers ? ?  ?Equipment Recommendations None recommended by  PT  ?Recommendations for Other Services ?    ?  ?Functional Status Assessment Patient has had a recent decline in their functional status and demonstrates the ability to make significant improvements in function in a reasonable and predictable amount of time.  ? ?  ?Precautions / Restrictions Precautions ?Precautions: Fall ?Required Braces or Orthoses: Splint/Cast ?Splint/Cast: RUE splint with non-op humerus fx ?Restrictions ?RUE Weight Bearing: Non weight bearing  ? ?  ? ?Mobility ? Bed Mobility ?Overal bed mobility: Modified Independent ?  ?  ?  ?  ?  ?  ?General bed mobility comments: pt able to roll to left and rise from surface without physical assist, HOB 20 degrees ?  ? ?Transfers ?Overall transfer level: Needs assistance ?  ?Transfers: Sit to/from Stand ?Sit to Stand: Supervision ?  ?  ?  ?  ?  ?General transfer comment: supervision for safety from bed, chair and toilet x 5 total trials ?  ? ?Ambulation/Gait ?Ambulation/Gait assistance: Min guard ?Gait Distance (Feet): 60 Feet ?Assistive device: None ?Gait Pattern/deviations: Step-through pattern ?  ?Gait velocity interpretation: 1.31 - 2.62 ft/sec, indicative of limited community ambulator ?  ?General Gait Details: pt walked 10', 5', 60', 60' respectively with seated rest after each trial limited by dizziness with HR 55-70 ? ?Stairs ?  ?  ?  ?  ?  ? ?Wheelchair Mobility ?  ? ?Modified Rankin (Stroke Patients Only) ?  ? ?  ? ?Balance Overall balance assessment: No apparent balance deficits (not formally assessed) ?  ?  ?  ?  ?  ?  ?  ?  ?  ?  ?  ?  ?  ?  ?  ?  ?  ?  ?   ? ? ? ?Pertinent Vitals/Pain Pain Assessment ?Pain Assessment:  0-10 ?Pain Score: 4  ?Pain Location: RUE 4/10 at rest, 7/10 with gait ?Pain Descriptors / Indicators: Guarding, Constant ?Pain Intervention(s): Limited activity within patient's tolerance, Monitored during session, Repositioned, Premedicated before session  ? ? ?Home Living Family/patient expects to be discharged to:: Private  residence ?Living Arrangements: Spouse/significant other ?Available Help at Discharge: Family;Available 24 hours/day ?Type of Home: House ?Home Access: Stairs to enter ?  ?Entrance Stairs-Number of Steps: 1 ?  ?Home Layout: Two level;Able to live on main level with bedroom/bathroom ?Home Equipment: Shower seat - built in;Rollator (4 wheels) ?Additional Comments: lack of appetite post covid  ?  ?Prior Function Prior Level of Function : Independent/Modified Independent ?  ?  ?  ?  ?  ?  ?Mobility Comments: limited gait grossly 150' due to dizziness and prolonged weakness (covid x 3) ?  ?  ? ? ?Hand Dominance  ?   ? ?  ?Extremity/Trunk Assessment  ? Upper Extremity Assessment ?Upper Extremity Assessment: Defer to OT evaluation ?  ? ?Lower Extremity Assessment ?Lower Extremity Assessment: Overall WFL for tasks assessed ?  ? ?Cervical / Trunk Assessment ?Cervical / Trunk Assessment: Normal  ?Communication  ? Communication: HOH  ?Cognition Arousal/Alertness: Awake/alert ?Behavior During Therapy: Flat affect ?Overall Cognitive Status: Within Functional Limits for tasks assessed ?  ?  ?  ?  ?  ?  ?  ?  ?  ?  ?  ?  ?  ?  ?  ?  ?  ?  ?  ? ?  ?General Comments   ? ?  ?Exercises    ? ?Assessment/Plan  ?  ?PT Assessment All further PT needs can be met in the next venue of care  ?PT Problem List Decreased activity tolerance;Decreased mobility;Decreased strength;Pain ? ?   ?  ?PT Treatment Interventions     ? 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 ?  ?Frequency   ?  ? ? ?Co-evaluation   ?  ?  ?  ?  ? ? ?  ?AM-PAC PT "6 Clicks" Mobility  ?Outcome Measure Help needed turning from your back to your side while in a flat bed without using bedrails?: None ?Help needed moving from lying on your back to sitting on the  side of a flat bed without using bedrails?: A Little ?Help needed moving to and from a bed to a chair (including a wheelchair)?: A Little ?Help needed standing up from a chair using your arms (e.g., wheelchair or bedside chair)?: A Little ?Help needed to walk in hospital room?: A Little ?Help needed climbing 3-5 steps with a railing? : A Little ?6 Click Score: 19 ? ?  ?End of Session Equipment Utilized During Treatment: Other (comment) (RUE sling) ?Activity Tolerance: Patient tolerated treatment well ?Patient left: in chair;with call bell/phone within reach;with family/visitor present ?Nurse Communication: Mobility status ?PT Visit Diagnosis: Other abnormalities of gait and mobility (R26.89);Pain ?Pain - Right/Left: Right ?Pain - part of body: Arm ?  ? ?Time: 5462-7035 ?PT Time Calculation (min) (ACUTE ONLY): 43 min ? ? ?Charges:   PT Evaluation ?$PT Eval Moderate Complexity: 1 Mod ?PT Treatments ?$Gait Training: 8-22 mins ?  ?   ? ? ?Tarena Gockley P, PT ?Acute Rehabilitation Services ?Pager: [phone] ?Office: [phone] ? ? ?Cheyane Ayon B Olanda Boughner ?02/10/2022, 12:02  PM ? ?

## 2022-02-11 ENCOUNTER — Inpatient Hospital Stay (HOSPITAL_COMMUNITY): Payer: PPO

## 2022-02-11 DIAGNOSIS — G4733 Obstructive sleep apnea (adult) (pediatric): Secondary | ICD-10-CM | POA: Diagnosis present

## 2022-02-11 DIAGNOSIS — E2740 Unspecified adrenocortical insufficiency: Secondary | ICD-10-CM | POA: Diagnosis present

## 2022-02-11 DIAGNOSIS — I2510 Atherosclerotic heart disease of native coronary artery without angina pectoris: Secondary | ICD-10-CM | POA: Diagnosis present

## 2022-02-11 DIAGNOSIS — E669 Obesity, unspecified: Secondary | ICD-10-CM | POA: Diagnosis present

## 2022-02-11 DIAGNOSIS — E039 Hypothyroidism, unspecified: Secondary | ICD-10-CM | POA: Diagnosis present

## 2022-02-11 DIAGNOSIS — I10 Essential (primary) hypertension: Secondary | ICD-10-CM | POA: Diagnosis present

## 2022-02-11 DIAGNOSIS — D619 Aplastic anemia, unspecified: Secondary | ICD-10-CM | POA: Diagnosis present

## 2022-02-11 DIAGNOSIS — Z888 Allergy status to other drugs, medicaments and biological substances status: Secondary | ICD-10-CM | POA: Diagnosis not present

## 2022-02-11 DIAGNOSIS — D539 Nutritional anemia, unspecified: Secondary | ICD-10-CM | POA: Diagnosis present

## 2022-02-11 DIAGNOSIS — S42301A Unspecified fracture of shaft of humerus, right arm, initial encounter for closed fracture: Secondary | ICD-10-CM | POA: Diagnosis present

## 2022-02-11 DIAGNOSIS — U099 Post covid-19 condition, unspecified: Secondary | ICD-10-CM | POA: Diagnosis present

## 2022-02-11 DIAGNOSIS — D649 Anemia, unspecified: Secondary | ICD-10-CM | POA: Diagnosis not present

## 2022-02-11 DIAGNOSIS — E782 Mixed hyperlipidemia: Secondary | ICD-10-CM | POA: Diagnosis present

## 2022-02-11 DIAGNOSIS — Z7901 Long term (current) use of anticoagulants: Secondary | ICD-10-CM | POA: Diagnosis not present

## 2022-02-11 DIAGNOSIS — E1151 Type 2 diabetes mellitus with diabetic peripheral angiopathy without gangrene: Secondary | ICD-10-CM | POA: Diagnosis present

## 2022-02-11 DIAGNOSIS — Y9222 Religious institution as the place of occurrence of the external cause: Secondary | ICD-10-CM | POA: Diagnosis not present

## 2022-02-11 DIAGNOSIS — R55 Syncope and collapse: Secondary | ICD-10-CM | POA: Diagnosis not present

## 2022-02-11 DIAGNOSIS — Z800 Family history of malignant neoplasm of digestive organs: Secondary | ICD-10-CM | POA: Diagnosis not present

## 2022-02-11 DIAGNOSIS — S42321A Displaced transverse fracture of shaft of humerus, right arm, initial encounter for closed fracture: Secondary | ICD-10-CM | POA: Diagnosis present

## 2022-02-11 DIAGNOSIS — I452 Bifascicular block: Secondary | ICD-10-CM | POA: Diagnosis present

## 2022-02-11 DIAGNOSIS — D689 Coagulation defect, unspecified: Secondary | ICD-10-CM | POA: Diagnosis present

## 2022-02-11 DIAGNOSIS — Z8371 Family history of colonic polyps: Secondary | ICD-10-CM | POA: Diagnosis not present

## 2022-02-11 DIAGNOSIS — I480 Paroxysmal atrial fibrillation: Secondary | ICD-10-CM | POA: Diagnosis present

## 2022-02-11 DIAGNOSIS — W1830XA Fall on same level, unspecified, initial encounter: Secondary | ICD-10-CM | POA: Diagnosis present

## 2022-02-11 DIAGNOSIS — T402X5A Adverse effect of other opioids, initial encounter: Secondary | ICD-10-CM | POA: Diagnosis present

## 2022-02-11 DIAGNOSIS — Z79899 Other long term (current) drug therapy: Secondary | ICD-10-CM | POA: Diagnosis not present

## 2022-02-11 DIAGNOSIS — K5903 Drug induced constipation: Secondary | ICD-10-CM | POA: Diagnosis present

## 2022-02-11 DIAGNOSIS — G901 Familial dysautonomia [Riley-Day]: Secondary | ICD-10-CM | POA: Diagnosis present

## 2022-02-11 LAB — CBC WITH DIFFERENTIAL/PLATELET
Abs Immature Granulocytes: 0.33 10*3/uL — ABNORMAL HIGH (ref 0.00–0.07)
Basophils Absolute: 0.1 10*3/uL (ref 0.0–0.1)
Basophils Relative: 1 %
Eosinophils Absolute: 0 10*3/uL (ref 0.0–0.5)
Eosinophils Relative: 0 %
HCT: 24 % — ABNORMAL LOW (ref 39.0–52.0)
Hemoglobin: 7.8 g/dL — ABNORMAL LOW (ref 13.0–17.0)
Immature Granulocytes: 3 %
Lymphocytes Relative: 66 %
Lymphs Abs: 6.9 10*3/uL — ABNORMAL HIGH (ref 0.7–4.0)
MCH: 31.7 pg (ref 26.0–34.0)
MCHC: 32.5 g/dL (ref 30.0–36.0)
MCV: 97.6 fL (ref 80.0–100.0)
Monocytes Absolute: 0.6 10*3/uL (ref 0.1–1.0)
Monocytes Relative: 5 %
Neutro Abs: 2.6 10*3/uL (ref 1.7–7.7)
Neutrophils Relative %: 25 %
Platelets: 155 10*3/uL (ref 150–400)
RBC: 2.46 MIL/uL — ABNORMAL LOW (ref 4.22–5.81)
RDW: 21.6 % — ABNORMAL HIGH (ref 11.5–15.5)
Smear Review: ADEQUATE
WBC: 10.5 10*3/uL (ref 4.0–10.5)
nRBC: 1.4 % — ABNORMAL HIGH (ref 0.0–0.2)

## 2022-02-11 LAB — BASIC METABOLIC PANEL
Anion gap: 5 (ref 5–15)
BUN: 13 mg/dL (ref 8–23)
CO2: 24 mmol/L (ref 22–32)
Calcium: 8.1 mg/dL — ABNORMAL LOW (ref 8.9–10.3)
Chloride: 103 mmol/L (ref 98–111)
Creatinine, Ser: 1.15 mg/dL (ref 0.61–1.24)
GFR, Estimated: >60 mL/min (ref >60)
Glucose, Bld: 93 mg/dL (ref 70–99)
Potassium: 3.9 mmol/L (ref 3.5–5.1)
Sodium: 132 mmol/L — ABNORMAL LOW (ref 135–145)

## 2022-02-11 LAB — MAGNESIUM: Magnesium: 1.7 mg/dL (ref 1.7–2.4)

## 2022-02-11 LAB — COPPER, SERUM: Copper: 107 ug/dL (ref 69–132)

## 2022-02-11 MED ORDER — IOHEXOL 300 MG/ML  SOLN
100.0000 mL | Freq: Once | INTRAMUSCULAR | Status: AC | PRN
  Administered 2022-02-11: 100 mL via INTRAVENOUS

## 2022-02-11 MED ORDER — IOHEXOL 9 MG/ML PO SOLN
ORAL | Status: AC
  Administered 2022-02-11: 500 mL
  Filled 2022-02-11: qty 1000

## 2022-02-11 MED ORDER — SENNOSIDES-DOCUSATE SODIUM 8.6-50 MG PO TABS
1.0000 | ORAL_TABLET | Freq: Every day | ORAL | Status: DC
  Administered 2022-02-11: 1 via ORAL
  Filled 2022-02-11: qty 1

## 2022-02-11 NOTE — Progress Notes (Signed)
?  Mobility Specialist Criteria Algorithm Info. ? ? ? 02/11/22 1223  ?Therapy Vitals  ?Patient Position (if appropriate) Orthostatic Vitals  ?Orthostatic Lying   ?BP- Lying 152/64  ?Pulse- Lying 61  ?Orthostatic Sitting  ?BP- Sitting 156/57  ?Pulse- Sitting 58  ?Orthostatic Standing at 0 minutes  ?BP- Standing at 0 minutes 125/56  ?Pulse- Standing at 0 minutes 64  ?Orthostatic Standing at 3 minutes  ?BP- Standing at 3 minutes 93/53  ?Pulse- Standing at 3 minutes 61  ?Oxygen Therapy  ?SpO2 97 %  ?O2 Device Room Air  ?Pulse Oximetry Type Continuous  ?Mobility  ?Activity Stood at bedside;Dangled on edge of bed  ?Range of Motion/Exercises Active;All extremities  ?Level of Assistance Modified independent, requires aide device or extra time  ?Assistive Device None  ?RUE Weight Bearing NWB  ?Activity Response Tolerated well; RN notified ?(+ orthostatic, asymptomatic)  ? ?Patient received in bed agreeable to participate in mobility. Completed orthostatic VS per request of RN. Deferred ambulation secondary to + ortho VS, was asymptomatic throughout. Pt was left dangling EOB with all needs met, call bell in reach.  ? ?02/11/2022 ?12:26 PM ? ?Martinique Tripp, CMS, BS EXP ?Acute Rehabilitation Services  ?AOZHY:[phone] ?Office: [phone] ? ?

## 2022-02-11 NOTE — Progress Notes (Signed)
24 hour urine collection completed. Nurse took urine specimen to lab. ?

## 2022-02-11 NOTE — TOC CAGE-AID Note (Signed)
Transition of Care (TOC) - CAGE-AID Screening ? ? ?Patient Details  ?Name: Lance Pham ?MRN: [id number] ?Date of Birth: [birth year] ? ?Transition of Care (TOC) CM/SW Contact:    ?Shantasia Hunnell C Tarpley-Carter, LCSWA ?Phone Number: ?02/11/2022, 10:24 AM ? ? ?Clinical Narrative: ?Pt participated in [place].  Pt stated he does not use substance or ETOH.  Pt was not offered resources, due to no usage of substance or ETOH.   ? ?Passenger transport manager, MSW, LCSW-A ?Pronouns:  She/Her/Hers ?[facility]itions of Care ?Clinical Social Worker ?Direct Number:  [phone] ?[email or web address] ? ?CAGE-AID Screening: ?Substance Abuse Screening unable to be completed due to: : Patient unable to participate ? ?Have You Ever Felt You Ought to Cut Down on Your Drinking or Drug Use?: No ?Have People Annoyed You By Critizing Your Drinking Or Drug Use?: No ?Have You Felt Bad Or Guilty About Your Drinking Or Drug Use?: No ?Have You Ever Had a Drink or Used Drugs First Thing In The Morning to Steady Your Nerves or to Get Rid of a Hangover?: No ?CAGE-AID Score: 0 ? ?Substance Abuse Education Offered: No ? ?  ? ? ? ? ? ? ?

## 2022-02-11 NOTE — Progress Notes (Signed)
? ?Progress Note ? ?Patient Name: Lance Pham ?Date of Encounter: 02/11/2022 ? ?[facility] Cardiologist: Shirlee More, MD  ? ?Subjective  ? ?NO SOB or CP   Has not been up yet   ? ?Inpatient Medications  ?  ?Scheduled Meds: ? acetaminophen  1,000 mg Oral TID  ? apixaban  5 mg Oral BID  ? cyanocobalamin  1,000 mcg Intramuscular Daily  ? FLUoxetine  40 mg Oral QHS  ? folic acid  035 mcg Oral QHS  ? midodrine  5 mg Oral TID  ? pyridostigmine  30 mg Oral BID  ? ?Continuous Infusions: ? sodium chloride    ? ?PRN Meds: ?ondansetron (ZOFRAN) IV, oxyCODONE  ? ?Vital Signs  ?  ?Vitals:  ? 02/10/22 1313 02/10/22 1957 02/11/22 0025 02/11/22 0448  ?BP: (!) 118/53 (!) 151/65 (!) 158/67 (!) 160/60  ?Pulse: 70 61 (!) 59 (!) 55  ?Resp:  18 18 18   ?Temp: (!) 96.9 ?F (36.1 ?C) 98.5 ?F (36.9 ?C) 98.1 ?F (36.7 ?C) 97.8 ?F (36.6 ?C)  ?TempSrc: Axillary Oral Oral Oral  ?SpO2: 93% 93% 94% 95%  ? ? ?Intake/Output Summary (Last 24 hours) at 02/11/2022 0900 ?Last data filed at 02/11/2022 0449 ?Gross per 24 hour  ?Intake 240 ml  ?Output 350 ml  ?Net -110 ml  ? ? ?  09/03/2021  ? 12:18 PM 07/03/2021  ?  4:40 PM 06/03/2021  ?  8:03 AM  ?Last 3 Weights  ?Weight (lbs) 285 lb 283 lb 0.6 oz 297 lb  ?Weight (kg) 129.275 kg 128.386 kg 134.718 kg  ?   ? ?Telemetry  ?  ?SR  - Personally Reviewed ? ?ECG  ?  ?NO new  - Personally Reviewed ? ?Physical Exam  ? ?GEN: No acute distress.   ?Neck: No JVD ?Cardiac: RRR, no murmurs,  ?Respiratory: Clear to auscultation bilaterally. ?GI: Soft, nontender, non-distended  ?MS: No edema;  R shoulder in bandage/arm in sling   ?Neuro:  Nonfocal  ?Psych: Normal affect  ? ?Labs  ?  ?High Sensitivity Troponin:  No results for input(s): TROPONINIHS in the last 720 hours.   ?Chemistry ?Recent Labs  ?Lab 02/09/22 ?1133 02/10/22 ?0013 02/11/22 ?0159  ?NA 134* 132* 132*  ?K 4.6 3.6 3.9  ?CL 108 106 103  ?CO2 21* 23 24  ?GLUCOSE 113* 123* 93  ?BUN 14 13 13   ?CREATININE 1.19 1.18 1.15  ?CALCIUM 8.0* 8.3* 8.1*  ?MG  --   --   1.7  ?PROT 8.8* 9.0*  --   ?ALBUMIN 2.2* 2.2*  --   ?AST 76* 62*  --   ?ALT 21 23  --   ?ALKPHOS 72 73  --   ?BILITOT 1.1 0.7  --   ?GFRNONAA >60 >60 >60  ?ANIONGAP 5 3* 5  ?  ?Lipids No results for input(s): CHOL, TRIG, HDL, LABVLDL, LDLCALC, CHOLHDL in the last 168 hours.  ?Hematology ?Recent Labs  ?Lab 02/09/22 ?1133 02/09/22 ?1641 02/10/22 ?0013 02/11/22 ?0159  ?WBC 10.8*  --  12.0* 10.5  ?RBC 2.28* 2.35* 2.41* 2.46*  ?HGB 7.2*  --  7.7* 7.8*  ?HCT 24.6*  --  23.7* 24.0*  ?MCV 107.9*  --  98.3 97.6  ?MCH 31.6  --  32.0 31.7  ?MCHC 29.3*  --  32.5 32.5  ?RDW 21.2*  --  22.1* 21.6*  ?PLT 170  --  150 155  ? ?Thyroid No results for input(s): TSH, FREET4 in the last 168 hours.  ?BNPNo results for  input(s): BNP, PROBNP in the last 168 hours.  ?DDimer No results for input(s): DDIMER in the last 168 hours.  ? ?Radiology  ?  ?DG Shoulder Right ? ?Result Date: 02/09/2022 ?CLINICAL DATA:  Fall with trauma to the right shoulder and arm. EXAM: RIGHT SHOULDER - 2+ VIEW COMPARISON:  None. FINDINGS: No fracture or dislocation at the shoulder joint. Humeral diaphyseal fracture described on separate report. IMPRESSION: Negative shoulder evaluation.  See results of humerus exam. Electronically Signed   By: Nelson Chimes M.D.   On: 02/09/2022 12:23  ? ?CT Head Wo Contrast ? ?Result Date: 02/09/2022 ?CLINICAL DATA:  Patient fell from standing. EXAM: CT HEAD WITHOUT CONTRAST CT CERVICAL SPINE WITHOUT CONTRAST TECHNIQUE: Multidetector CT imaging of the head and cervical spine was performed following the standard protocol without intravenous contrast. Multiplanar CT image reconstructions of the cervical spine were also generated. RADIATION DOSE REDUCTION: This exam was performed according to the departmental dose-optimization program which includes automated exposure control, adjustment of the mA and/or kV according to patient size and/or use of iterative reconstruction technique. COMPARISON:  Head CT 11/21/2020. FINDINGS: CT HEAD FINDINGS  Brain: There is no evidence for acute hemorrhage, hydrocephalus, mass lesion, or abnormal extra-axial fluid collection. No definite CT evidence for acute infarction. Vascular: No hyperdense vessel or unexpected calcification. Skull: No evidence for fracture. No worrisome lytic or sclerotic lesion. Sinuses/Orbits: Visualized paranasal sinuses are clear. Right mastoid effusion is similar to prior. Other: None. CT CERVICAL SPINE FINDINGS Alignment: Normal. Skull base and vertebrae: No acute fracture. No primary bone lesion or focal pathologic process. Soft tissues and spinal canal: No prevertebral fluid or swelling. No visible canal hematoma. Disc levels: Loss of disc height with endplate degeneration noted C6-7 and C7-T1. Upper chest: Unremarkable. Other: None. IMPRESSION: 1. No acute intracranial abnormality. 2. No cervical spine fracture or subluxation. 3. Degenerative changes in the lower cervical spine. Electronically Signed   By: Misty Stanley M.D.   On: 02/09/2022 11:59  ? ?CT Cervical Spine Wo Contrast ? ?Result Date: 02/09/2022 ?CLINICAL DATA:  Patient fell from standing. EXAM: CT HEAD WITHOUT CONTRAST CT CERVICAL SPINE WITHOUT CONTRAST TECHNIQUE: Multidetector CT imaging of the head and cervical spine was performed following the standard protocol without intravenous contrast. Multiplanar CT image reconstructions of the cervical spine were also generated. RADIATION DOSE REDUCTION: This exam was performed according to the departmental dose-optimization program which includes automated exposure control, adjustment of the mA and/or kV according to patient size and/or use of iterative reconstruction technique. COMPARISON:  Head CT 11/21/2020. FINDINGS: CT HEAD FINDINGS Brain: There is no evidence for acute hemorrhage, hydrocephalus, mass lesion, or abnormal extra-axial fluid collection. No definite CT evidence for acute infarction. Vascular: No hyperdense vessel or unexpected calcification. Skull: No evidence for  fracture. No worrisome lytic or sclerotic lesion. Sinuses/Orbits: Visualized paranasal sinuses are clear. Right mastoid effusion is similar to prior. Other: None. CT CERVICAL SPINE FINDINGS Alignment: Normal. Skull base and vertebrae: No acute fracture. No primary bone lesion or focal pathologic process. Soft tissues and spinal canal: No prevertebral fluid or swelling. No visible canal hematoma. Disc levels: Loss of disc height with endplate degeneration noted C6-7 and C7-T1. Upper chest: Unremarkable. Other: None. IMPRESSION: 1. No acute intracranial abnormality. 2. No cervical spine fracture or subluxation. 3. Degenerative changes in the lower cervical spine. Electronically Signed   By: Misty Stanley M.D.   On: 02/09/2022 11:59  ? ?DG Bone Survey Met ? ?Result Date: 02/10/2022 ?CLINICAL DATA:  Possible pathologic  fracture right humerus. Evaluate for additional osseous metastatic disease. EXAM: METASTATIC BONE SURVEY COMPARISON:  Radiographs of the right humerus and shoulder obtained same day FINDINGS: Skull: No focal lytic or blastic osseous lesion. Cervical Spine: No focal lytic or blastic osseous lesion. Thoracic Spine: No focal lytic or blastic osseous lesion. Chest: No focal lytic or blastic osseous lesion. Lumbar Spine: No focal lytic or blastic osseous lesion. Pelvis: No focal lytic or blastic osseous lesion. Right Upper Extremity: Comminuted fracture through the mid humeral diaphysis with suggestion of permeative underlying lucency concerning for metastatic focus. Evidence of remote prior ORIF of distal radius fracture with volar plate and screw construct. Left Upper Extremity: No focal lytic or blastic osseous lesion. Right Lower Extremity: No focal lytic or blastic osseous lesion. Degenerative osteoarthritis of the knee joint. Scattered dermal calcifications. Left Lower Extremity: No focal lytic or blastic osseous lesion. Degenerative osteoarthritis of the knee joint. Scattered dermal and soft tissue  calcifications. IMPRESSION: 1. Mildly comminuted fracture through the mid shaft of the right humerus. The underlying bone appears somewhat irregular raising concern for an ill-defined permeative lesion. 2. No

## 2022-02-11 NOTE — Plan of Care (Signed)

## 2022-02-11 NOTE — Progress Notes (Addendum)
? ?Subjective: ? ?Patient is seen at bedside with his wife present. He reports feeling much better today. He states that he felt nauseous yesterday while sitting on toilet for BM, states was straining a bit, no emetic episodes. He was able to ambulate well with physical therapy yesterday but began to feel dizzy and lightheaded and had to sit down. This is similar to how he feels at home when ambulating. Does not endorse SOB, palpitations, or dizziness/lightheadedness with supine. ? ?We discussed results of bone survey and recent lab work. Discussed need for further imaging to search for possible primary source of malignancy, patient agreeable. ? 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 Lap band surgery 10 years ago.  ? ?Objective: ? ?Vital signs in last 24 hours: ?Vitals:  ? 02/10/22 1313 02/10/22 1957 02/11/22 0025 02/11/22 0448  ?BP: (!) 118/53 (!) 151/65 (!) 158/67 (!) 160/60  ?Pulse: 70 61 (!) 59 (!) 55  ?Resp:  18 18 18   ?Temp: (!) 96.9 ?F (36.1 ?C) 98.5 ?F (36.9 ?C) 98.1 ?F (36.7 ?C) 97.8 ?F (36.6 ?C)  ?TempSrc: Axillary Oral Oral Oral  ?SpO2: 93% 93% 94% 95%  ? ?Weight change:  ? ?Intake/Output Summary (Last 24 hours) at 02/11/2022 1019 ?Last data filed at 02/11/2022 0449 ?Gross per 24 hour  ?Intake 240 ml  ?Output 350 ml  ?Net -110 ml  ? ?Physical Exam: ?Constitutional: Elderly caucasian male laying comfortably in bed, alert and conversant, NAD ?HEENT: normocephalic, atraumatic, moist mucus membranes ?Cardio: Normal rate and rhythm, no murmurs ?Pulm: CTAB, no increased work of breathing on room air, no crackles or wheezes ?Abd: soft, nontender, nondistended, + normal bowel sounds ?Extremities: right arm in sling, no edema to bilateral LE, normal skin turgor ?Neuro: alert and oriented x3, answering questions appropriately ?Psych: normal mood and affect ? ?Assessment/Plan: ? ?Principal  Problem: ?  Symptomatic anemia ? 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 ?  ?#Presyncopal episode, multifactorial ?Presyncopal episode likely multifactorial in etiology. 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 He remains euvolemic on exam and does not endorse SOB, palpitations, or dizziness/lightheadedness while supine. Patient found to be profoundly orthostatic yesterday with a significant drop in systolic BP of [id number] with sitting to standing and continued drop of additional 46mHg with prolonged standing with HR remaining in 60s. Orthostatic hypotension secondary to autonomic dysfunction likely. Medications were adjusted and patient started on Mestinon yesterday, as below per cardio recs.  Today patient asymptomatic during orthostatic vitals, with some improvement in vitals; systolic BP decrease of [id number] sitting to standing and continued decrease of additional 320mg with prolonged standing, HR continues to inappropriately remain in 60s. Will continue to obtain daily orthostatics on patient and monitor response to medication changes. Patient to continue working with PT/OT. Given evidence of RBBB and LAFB on admission EKG, could be contributing presyncopal  episodes. Hgb stable post 1u pRBC, however only mildly improved from admit Hgb 7.2 to 7.8 today.  Macrocytic anemia as below, likely contributing to  symptoms.   ?Plan: ?-Consult to cardiology, appreciate recommendations ?-Cardiac monitoring ?-Daily orthostatic vitals ?-Florinef discontinued, midodrine decreased to 5 mg TID ?-Start Mestinon 71m BID  ?-To trial abdominal binder and thigh compression shorts, encourage increased po intake ?-PT/OT eval and treat ?  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 Patient asymptomatic while supine, but endorsing dizziness with standing and ambulation secondary to above, but anemia likely contributory.  Skeletal survey was done yesterday for concern for possible multiple myeloma. No additional lytic or blastic osseous lesions on imaging were found, making MM less likely but could be early on in disease, work up pending as below. Previously seen lytic lesion of humerus and patients significant weight loss of 65 lb loss over the past 6 months is however still concerning for possible metastasis from other primary source. Humerus is the second most common site of metastatic disease involving long bones primarily from breast, lung, prostate malignancies, followed by the thyroid and kidney. Will obtain CT Chest/Abd/pelvis today to look for primary source. MMA level pending to rule out B12 deficiency given low-normal B12. Copper deficiency remains on the differential given hx of Lap-Band surgery, Copper level pending. Final blood smear read showing anisocytosis and anisochromia, commonly seen in anemia. Next CBC tm morning.    ?Plan: ?-Post 1 unit PRBCs ?-Follow-up CT Chest/Abd/pelvis  ?-Follow-up copper ?-Follow-up MMA ?-AM CBC ?-Follow-up SPEP, UPEP, immunofixation ?-On folic acid supplementation ?-Continue B12 IM supplementation ?-Zofran once PRN for nausea ?  ?#Angulated and displaced fracture of the mid humeral diaphysis ?Ground-level fall, corrected  calcium normal at 9.4.  Orthopedics consulted in the ED.  Splint applied by [facility]. Ortho will proceed with non-operative management, outpatient follow up with Dr. DMarlou Saafter discharge. Pain currently adequately managed. Patient denies numbness, tingling, pain in his distal RUE, low concern for compartment syndrome.  ?Plan: ?-Orthopedics following, appreciate recommendations ?-Splint in place ?-Tylenol 1000 mg TID scheduled ?-Oxycodone 5 mg every 6 hours as needed ?-Can get additional dose of dilaudid if needed ? ?#Opioid related constipation  ?Patient with hx of chronic loose stools. Yesterday with episode of nausea while straining and with prolonged sitting on toilet for BM. States has only on BM since admission, but this is common for him. Patient also on opioid for pain control as above, likely contributing to constipation. Will add bowel regime as below. ?-Senna 1 tab daily ? ?#Paroxysmal atrial fibrillation ?#History of embolic CVA ?Patient remains in NSR. Will continue home Eliquis 5 mg twice daily and cardiac monitoring. ?  ?#Depression ?Continued on home Prozac 40 mg daily ?  ?Diet: Regular  ?VTE: Eliquis ?IVF: None ?Code: Full ? ? LOS: 2 days  ? ?Rodriguez-Teodoro, GQuintella Reichert Medical Student ?02/11/2022, 10:19 AM  ?

## 2022-02-12 DIAGNOSIS — S42321A Displaced transverse fracture of shaft of humerus, right arm, initial encounter for closed fracture: Secondary | ICD-10-CM

## 2022-02-12 LAB — PROTEIN ELECTROPHORESIS, SERUM
A/G Ratio: 0.5 — ABNORMAL LOW (ref 0.7–1.7)
Albumin ELP: 3.1 g/dL (ref 2.9–4.4)
Alpha-1-Globulin: 0.2 g/dL (ref 0.0–0.4)
Alpha-2-Globulin: 0.6 g/dL (ref 0.4–1.0)
Beta Globulin: 0.7 g/dL (ref 0.7–1.3)
Gamma Globulin: 4.5 g/dL — ABNORMAL HIGH (ref 0.4–1.8)
Globulin, Total: 6.1 g/dL — ABNORMAL HIGH (ref 2.2–3.9)
M-Spike, %: 4.2 g/dL — ABNORMAL HIGH
Total Protein ELP: 9.2 g/dL — ABNORMAL HIGH (ref 6.0–8.5)

## 2022-02-12 LAB — CBC
HCT: 25.2 % — ABNORMAL LOW (ref 39.0–52.0)
Hemoglobin: 8 g/dL — ABNORMAL LOW (ref 13.0–17.0)
MCH: 31 pg (ref 26.0–34.0)
MCHC: 31.7 g/dL (ref 30.0–36.0)
MCV: 97.7 fL (ref 80.0–100.0)
Platelets: 182 10*3/uL (ref 150–400)
RBC: 2.58 MIL/uL — ABNORMAL LOW (ref 4.22–5.81)
RDW: 21.1 % — ABNORMAL HIGH (ref 11.5–15.5)
WBC: 10.9 10*3/uL — ABNORMAL HIGH (ref 4.0–10.5)
nRBC: 1.9 % — ABNORMAL HIGH (ref 0.0–0.2)

## 2022-02-12 LAB — BASIC METABOLIC PANEL
Anion gap: 3 — ABNORMAL LOW (ref 5–15)
BUN: 12 mg/dL (ref 8–23)
CO2: 27 mmol/L (ref 22–32)
Calcium: 8.5 mg/dL — ABNORMAL LOW (ref 8.9–10.3)
Chloride: 101 mmol/L (ref 98–111)
Creatinine, Ser: 1.2 mg/dL (ref 0.61–1.24)
GFR, Estimated: >60 mL/min (ref >60)
Glucose, Bld: 84 mg/dL (ref 70–99)
Potassium: 3.6 mmol/L (ref 3.5–5.1)
Sodium: 131 mmol/L — ABNORMAL LOW (ref 135–145)

## 2022-02-12 LAB — METHYLMALONIC ACID, SERUM: Methylmalonic Acid, Quantitative: 268 nmol/L (ref 0–378)

## 2022-02-12 MED ORDER — POLYETHYLENE GLYCOL 3350 17 G PO PACK: 17.0000 g | PACK | Freq: Every day | ORAL | Status: DC | PRN

## 2022-02-12 MED ORDER — MIDODRINE HCL 5 MG PO TABS: 5.0000 mg | ORAL_TABLET | Freq: Three times a day (TID) | ORAL | 0 refills | Status: DC

## 2022-02-12 MED ORDER — SENNOSIDES-DOCUSATE SODIUM 8.6-50 MG PO TABS: 2.0000 | ORAL_TABLET | Freq: Every evening | ORAL | 0 refills | Status: AC | PRN

## 2022-02-12 MED ORDER — POLYETHYLENE GLYCOL 3350 17 G PO PACK: 17.0000 g | PACK | Freq: Every day | ORAL | 0 refills | Status: AC | PRN

## 2022-02-12 MED ORDER — PYRIDOSTIGMINE BROMIDE 60 MG PO TABS
30.0000 mg | ORAL_TABLET | Freq: Two times a day (BID) | ORAL | 0 refills | Status: AC
Start: 2022-02-12 — End: 2022-03-14

## 2022-02-12 MED ORDER — OXYCODONE HCL 5 MG PO TABS: 5.0000 mg | ORAL_TABLET | Freq: Four times a day (QID) | ORAL | 0 refills | Status: AC | PRN

## 2022-02-12 MED ORDER — CYANOCOBALAMIN 1000 MCG/ML IJ SOLN
1000.0000 ug | INTRAMUSCULAR | 0 refills | Status: AC
Start: 2022-02-12 — End: ?

## 2022-02-12 NOTE — Discharge Summary (Signed)
? ?Name: Lance Pham ?MRN: [id number] ?DOB: [birth year] 71 y.o. ?PCP: Bonnita Nasuti, MD ? ?Date of Admission: 02/09/2022 11:01 AM ?Date of Discharge: 02/12/22 ?Attending Physician: Aldine Contes, MD ? ?Discharge Diagnosis: ?1. Symptomatic acute on chronic hypo-proliferative macrocytic anemia ?2. Angulated and displaced fracture of the mid humeral diaphysis ?3. Opioid induced constipation  ?4. Presyncopal episodes, multifactorial ? ?Discharge Medications: ?Allergies as of 02/12/2022   ? ?   Reactions  ? Nsaids Other (See Comments)  ? Patient had a Lap band placed is suppose to have NO NSAIDS due to the possibility of esophageal erosion leading to band "coming through"  ? Tamiflu [oseltamivir] Nausea Only, Other (See Comments)  ? Stomach upset  ? ?  ? ?  ?Medication List  ?  ? ?STOP taking these medications   ? ?fludrocortisone 0.1 MG tablet ?Commonly known as: FLORINEF ?  ?losartan 50 MG tablet ?Commonly known as: COZAAR ?  ?testosterone cypionate 200 MG/ML injection ?Commonly known as: DEPOTESTOSTERONE CYPIONATE ?  ? ?  ? ?TAKE these medications   ? ?acetaminophen 500 MG tablet ?Commonly known as: TYLENOL ?Take 1,000 mg by mouth every 6 (six) hours as needed for mild pain or headache. ?  ?albuterol 108 (90 Base) MCG/ACT inhaler ?Commonly known as: VENTOLIN HFA ?Inhale 1 puff into the lungs every 4 (four) hours as needed for wheezing or shortness of breath. ?  ?apixaban 5 MG Tabs tablet ?Commonly known as: ELIQUIS ?Take 5 mg by mouth 2 (two) times daily. ?  ?benzonatate 200 MG capsule ?Commonly known as: TESSALON ?Take 200 mg by mouth 3 (three) times daily as needed for cough. ?  ?buPROPion 150 MG 12 hr tablet ?Commonly known as: WELLBUTRIN SR ?Take 150 mg by mouth daily. ?  ?cyanocobalamin 1000 MCG/ML injection ?Commonly known as: (VITAMIN B-12) ?Inject 1 mL (1,000 mcg total) into the muscle every 30 (thirty) days. ?What changed: how much to take ?  ?ferrous sulfate 324 (65 Fe) MG Tbec ?Take 324 mg by mouth daily  after supper. ?  ?FLUoxetine 40 MG capsule ?Commonly known as: PROZAC ?Take 40 mg by mouth at bedtime. ?  ?folic acid 798 MCG tablet ?Commonly known as: FOLVITE ?Take 400 mcg by mouth at bedtime. ?  ?midodrine 5 MG tablet ?Commonly known as: PROAMATINE ?Take 1 tablet (5 mg total) by mouth 3 (three) times daily. ?What changed:  ?medication strength ?how much to take ?when to take this ?  ?oxyCODONE 5 MG immediate release tablet ?Commonly known as: Oxy IR/ROXICODONE ?Take 1 tablet (5 mg total) by mouth every 6 (six) hours as needed for up to 3 days for severe pain. ?  ?polyethylene glycol 17 g packet ?Commonly known as: MIRALAX / GLYCOLAX ?Take 17 g by mouth daily as needed for mild constipation or moderate constipation. ?  ?pyridostigmine 60 MG tablet ?Commonly known as: MESTINON ?Take 0.5 tablets (30 mg total) by mouth 2 (two) times daily. ?  ?senna-docusate 8.6-50 MG tablet ?Commonly known as: Senokot-S ?Take 2 tablets by mouth at bedtime as needed for mild constipation or moderate constipation. ?  ? ?  ? ? ?Disposition and follow-up:   ?[name] was [facility] in Stable condition.  At the hospital follow up visit please address: ? ?Symptomatic acute on chronic hypo-proliferative macrocytic anemia ? ?Angulated and displaced fracture of the mid humeral diaphysis ? ?Presyncopal episode, multifactorial ? ?Labs / imaging needed at time of follow-up: CBC ? ?Pending labs/ test needing follow-up: SPEP,  UPEP and immunofixation, MMA ? ?Follow-up Appointments: ? Follow-up Information   ? ? [facility] Follow up.   ?Why: The outpatient rehab will contact you for the first appointment ?Contact information: ?[facility] ?[address] ?[place] ? [place],  ?[address] ?[phone] ? ?  ?  ? ? Hague, Rosalyn Charters, MD Follow up.   ?Specialty: Internal Medicine ?Contact information: ?[address] ?[place] [address] ?[phone] ? ? ?  ?   ? ? Richardo Priest, MD .   ?Specialties: Cardiology, Radiology ?Contact information: ?[address] ?[place] [address] ?[phone] ? ? ?  ?  ? ? Meredith Pel, MD Follow up.   ?Specialty: Orthopedic Surgery ?Why: This orthopedic office will set up an appointment for you in 1 week.  If you have not heard from them please call them to find out your appointment time and date. ?Contact information: ?[address] ?[place] [address] ?[phone] ? ? ?  ?  ? ?  ?  ? ?  ? ? ?Hospital Course by Problem List: ? 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  ? ?#Presyncopal symptoms, multifactorial ?Patient presented with presyncopal episode, fell backward into the pew at church after rising to stand. Etiology of episode likely multifactorial in etiology. He has ten year history of presyncopal episodes acutely worsened in the last few months and was previously being treated for suspected orthostatic hypotension secondary to COVID related dysautonomia with midodrine BID and Florinef daily. Patient also found to have worsening macrocytic anemia, as below, and evidence of RBBB and LAFB on admission EKG, both of which may be contributing to presyncopal episodes. Cardiology was consulted sue to concern for bifascicular block. TTE was performed as part of syncopal workup to evaluate for structural heart disease which showed no significant changes compared to previous on 11/2020. Orthostatic vitals were obtained and patient found to be profoundly  orthostatic with a significant total decrease in systolic BP of 14HFWY when transitioning from sitting to standing and HR remaining inappropriately in the 60s. Patient was provided an abdominal binder and medications were adjusted; Florinef was discontinued (Cortisol normal per OP cardiology workup), midodrine was decreased to '5mg'$  TID, and Mestinon '30mg'$  BID was started. Midodrine can cause supine hypertension whereas there is evidence that Mestinon can be used for orthostatic hypotension without causing supine hypertension. Daily orthostatic vital on patient obtained to monitor response to medication changes. Orthostatics on day of discharge with abdominal binder placed showed further improvement with total decrease in systolic BP of 44 mmHg with sitting to standing. On discharge, patient expressing subjective improvement to symptoms with only intermittent dizziness/lightheadedness with standing. He will continue to work with PT outpatient and discharge on midodrine '5mg'$  TID and Mestinon '30mg'$  BID. He will need close follow-up with outpatient cardiologist to manage medication regimen and evaluate significance of RBBB and LAFB, possible bifascicular block, on EKG which can also cause syncope. Patient to schedule appointment with already established provider, Dr. Bettina Gavia. We will try to provide documentation of this hospitalization and workup for his PCP. ? ?#Symptomatic acute on  chronic hypo-proliferative macrocytic anemia ?#History of iron deficiency anemia ?#Hx of B12 deficiency ?Patient presented with hemoglobin 7.2, MCV 107, and symptoms of presyncope, generalized weakness, fatigue, and SOB on exertion. On chart review Hgb found to be downtrending over the last four months. Patient was given 1u pRBC transfusion and further worked up for cause of symptomatic macrocytic anemia. Folic acid found to be normal and given hx of IDA, iron panel was completed and normal. Patient has hx of B12 deficiency on monthly B12  injections though has been skipping injections. B12 found to be low-normal, MMA level obtained and found to be wnl, B12 repleated with IM injections while inpatient. Given hx of Lap-Band surgery Copper level

## 2022-02-12 NOTE — Progress Notes (Signed)
Patient seen this morning.  Radial nerve function intact with intact wrist extension and EPL function.  Mild pain to palpation.  Coaptation splint in good alignment.  Plan at this time is to follow-up in clinic next week to change over to Sarmiento brace and repeat radiographs. ?

## 2022-02-12 NOTE — Progress Notes (Signed)
Discharge instructions reviewed with pt and his wife by RN Chester Holstein SWOT RN.  ?Copy of instructions given to pt. Scripts were sent to pt's pharmacy. ? ?Pt d/c'd via wheelchair with belongings, with wife.            ?Escorted by  hospital volunteer.  ?

## 2022-02-12 NOTE — Progress Notes (Addendum)
? ?Progress Note ? ?Patient Name: Lance Pham ?Date of Encounter: 02/12/2022 ? ?[facility] Cardiologist: Shirlee More, MD  ? ?Subjective  ? ?Pt feeling a little less dizzy today    Orthostatics not done yet   ? ?Inpatient Medications  ?  ?Scheduled Meds: ? acetaminophen  1,000 mg Oral TID  ? apixaban  5 mg Oral BID  ? cyanocobalamin  1,000 mcg Intramuscular Daily  ? FLUoxetine  40 mg Oral QHS  ? folic acid  330 mcg Oral QHS  ? midodrine  5 mg Oral TID  ? pyridostigmine  30 mg Oral BID  ? senna-docusate  1 tablet Oral QHS  ? ?Continuous Infusions: ? sodium chloride    ? ?PRN Meds: ?ondansetron (ZOFRAN) IV, oxyCODONE, polyethylene glycol  ? ?Vital Signs  ?  ?Vitals:  ? 02/11/22 2335 02/11/22 2336 02/12/22 0000 02/12/22 0400  ?BP: 139/65 139/68 140/70 138/68  ?Pulse: 62 62  60  ?Resp: 16 16 16 16   ?Temp: 97.9 ?F (36.6 ?C) 97.9 ?F (36.6 ?C) 97.9 ?F (36.6 ?C) 97.9 ?F (36.6 ?C)  ?TempSrc: Oral Oral Oral Oral  ?SpO2:      ?Weight:  129.3 kg    ?Height:  6' 2"  (1.88 m)    ? ? ?Intake/Output Summary (Last 24 hours) at 02/12/2022 0946 ?Last data filed at 02/12/2022 0600 ?Gross per 24 hour  ?Intake 1390 ml  ?Output 1500 ml  ?Net -110 ml  ? ? ?  02/11/2022  ? 11:36 PM 09/03/2021  ? 12:18 PM 07/03/2021  ?  4:40 PM  ?Last 3 Weights  ?Weight (lbs) 285 lb 0.9 oz 285 lb 283 lb 0.6 oz  ?Weight (kg) 129.3 kg 129.275 kg 128.386 kg  ?   ? ?Telemetry  ?  ?SR - Personally Reviewed ? ?ECG  ?  ?NO new  - Personally Reviewed ? ?Physical Exam  ? ?Othostatic VS yesterday ? ?Laying 152/64   P 61   Sitting 156/57 P58   Stanidng 125/56 P 64   Standing 3 min  93/53   P 61   ?GEN: No acute distress.   ?Neck: No JVD ?Cardiac: RRR, no murmur    ?Respiratory: Clear to auscultation bilaterally. ?GI: Soft, nontender, non-distended  ?MS: No edema;  R shoulder in bandage/arm in sling   ?Neuro:  Nonfocal  ?Psych: Normal affect  ? ?Labs  ?  ?High Sensitivity Troponin:  No results for input(s): TROPONINIHS in the last 720 hours.   ?Chemistry ?Recent Labs   ?Lab 02/09/22 ?1133 02/10/22 ?0013 02/11/22 ?0159 02/12/22 ?0256  ?NA 134* 132* 132* 131*  ?K 4.6 3.6 3.9 3.6  ?CL 108 106 103 101  ?CO2 21* 23 24 27   ?GLUCOSE 113* 123* 93 84  ?BUN 14 13 13 12   ?CREATININE 1.19 1.18 1.15 1.20  ?CALCIUM 8.0* 8.3* 8.1* 8.5*  ?MG  --   --  1.7  --   ?PROT 8.8* 9.0*  --   --   ?ALBUMIN 2.2* 2.2*  --   --   ?AST 76* 62*  --   --   ?ALT 21 23  --   --   ?ALKPHOS 72 73  --   --   ?BILITOT 1.1 0.7  --   --   ?GFRNONAA >60 >60 >60 >60  ?ANIONGAP 5 3* 5 3*  ?  ?Lipids No results for input(s): CHOL, TRIG, HDL, LABVLDL, LDLCALC, CHOLHDL in the last 168 hours.  ?Hematology ?Recent Labs  ?Lab 02/10/22 ?0013 02/11/22 ?0159 02/12/22 ?  0256  ?WBC 12.0* 10.5 10.9*  ?RBC 2.41* 2.46* 2.58*  ?HGB 7.7* 7.8* 8.0*  ?HCT 23.7* 24.0* 25.2*  ?MCV 98.3 97.6 97.7  ?MCH 32.0 31.7 31.0  ?MCHC 32.5 32.5 31.7  ?RDW 22.1* 21.6* 21.1*  ?PLT 150 155 182  ? ?Thyroid No results for input(s): TSH, FREET4 in the last 168 hours.  ?BNPNo results for input(s): BNP, PROBNP in the last 168 hours.  ?DDimer No results for input(s): DDIMER in the last 168 hours.  ? ?Radiology  ?  ?CT CHEST ABDOMEN PELVIS W CONTRAST ? ?Result Date: 02/11/2022 ?CLINICAL DATA:  Suspected malignancy EXAM: CT CHEST, ABDOMEN, AND PELVIS WITH CONTRAST TECHNIQUE: Multidetector CT imaging of the chest, abdomen and pelvis was performed following the standard protocol during bolus administration of intravenous contrast. RADIATION DOSE REDUCTION: This exam was performed according to the departmental dose-optimization program which includes automated exposure control, adjustment of the mA and/or kV according to patient size and/or use of iterative reconstruction technique. CONTRAST:  130m OMNIPAQUE IOHEXOL 300 MG/ML  SOLN COMPARISON:  CTA chest done on 06/20/2018 FINDINGS: CT CHEST FINDINGS Cardiovascular: There is homogeneous enhancement in thoracic aorta. Small pocket of air is seen in the main pulmonary artery, possibly introduced during venipuncture.  There are no filling defects in the central pulmonary artery branches. Mediastinum/Nodes: No new significant lymphadenopathy seen. Thyroid is smaller than usual in size. Lungs/Pleura: In the image 52 of series 4, there is 5 mm nodule in the anterior left upper lobe. In the image 61, there is 7 mm linear nodular density in the left upper lobe. Small patchy infiltrates are seen in the posterior lower lung fields, more so on the left side. Minimal pleural effusions are seen, more so on the left side. There is no pneumothorax. Musculoskeletal: Unremarkable. CT ABDOMEN PELVIS FINDINGS Hepatobiliary: No focal abnormality is seen in the liver. Gallbladder is not seen. Surgical clips are seen in gallbladder fossa. There is no significant dilation of bile ducts. Pancreas: No focal abnormality is seen. Spleen: Unremarkable Adrenals/Urinary Tract: There is 14 mm nodule in the left adrenal. This finding has not changed significantly. There is no hydronephrosis. There is perinephric stranding, more so on the right side. There are no renal or ureteral stones. Urinary bladder is not distended. Stomach/Bowel: Small hiatal hernia is seen. There is previous gastric lap band placement. Small bowel loops are not dilated. Appendix is not dilated. There is mild wall thickening in the ascending colon and hepatic flexure. There is mild pericolic stranding without loculated fluid collections posterior to the ascending colon, possibly perinephric stranding. There is incomplete distention of right colon. Scattered diverticula are seen in colon without signs of focal acute diverticulitis. Vascular/Lymphatic: Scattered arterial calcifications are seen. There are subcentimeter nodes in the retroperitoneum and mesentery. Reproductive: There are coarse calcifications in the enlarged prostate. There are scattered calcifications in the base of the penis. Other: There is no ascites or pneumoperitoneum. Small umbilical hernia containing fat is seen.  Small bilateral inguinal hernias containing fat are noted. Musculoskeletal: There are no focal lytic or sclerotic lesions. IMPRESSION: There is no significant lymphadenopathy in the mediastinum. There are subcentimeter nodular densities in the left upper lung fields. This finding may suggest scarring or granulomas or neoplastic process. Follow-up CT in 6 months may be considered to re-evaluate this finding. Small patchy infiltrates are seen in the both lower lobes, more so on the left side suggesting atelectasis/pneumonia. Minimal bilateral pleural effusions, more so on the left side. There is no evidence  of intestinal obstruction or pneumoperitoneum. There is no hydronephrosis. Appendix is not dilated. There is mild diffuse wall thickening in the ascending colon and hepatic flexure. This may be related to chronic nonspecific colitis. Part of this finding may be due to incomplete distention. There is perinephric stranding around the kidneys, more so on the right side. This may be residual from previous obstruction or chronic inflammation. There is no loculated perinephric fluid collection. There is 14 mm nodule in the left adrenal which may suggest incidental benign adenoma or some other neoplastic process. This finding has not changed significantly in comparison with the CT done on 06/20/2018. Small hiatal hernia.  Diverticulosis of colon.  Enlarged prostate. Other findings as described in the body of the report. Electronically Signed   By: Elmer Picker M.D.   On: 02/11/2022 15:06  ? ?DG Bone Survey Met ? ?Result Date: 02/10/2022 ?CLINICAL DATA:  Possible pathologic fracture right humerus. Evaluate for additional osseous metastatic disease. EXAM: METASTATIC BONE SURVEY COMPARISON:  Radiographs of the right humerus and shoulder obtained same day FINDINGS: Skull: No focal lytic or blastic osseous lesion. Cervical Spine: No focal lytic or blastic osseous lesion. Thoracic Spine: No focal lytic or blastic osseous  lesion. Chest: No focal lytic or blastic osseous lesion. Lumbar Spine: No focal lytic or blastic osseous lesion. Pelvis: No focal lytic or blastic osseous lesion. Right Upper Extremity: Comminuted fractur

## 2022-02-12 NOTE — Progress Notes (Signed)
Physical Therapy Treatment ?Patient Details ?Name: Lance Pham ?MRN: [id number] ?DOB: 1951/08/06 ?Today's Date: 02/12/2022 ? ? ?History of Present Illness 71 yo male adm 4/9 after fall at [employer] with Rt humerus fx. 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 ?  ?PT Comments  ? ? Pt was able to ambulate within the room today without UE support, LOB, or assistance, but remains limited in mobility by symptomatic orthostatic hypotension even with the abdominal binder donned, see below. Educated pt on functional strengthening exercises like sit <> stands, waiting when initially coming to stand before ambulating to ensure safety, checking BP in various positions, use of a rollator with L UE only to have seated surface available for safety, using urinal for safety at night, elevation of R UE at rest, use of R UE sling, proper use and donning of abdominal binder, and wearing the abdominal binder and compression stockings and performing leg and UE exercises in sitting prior to standing and then once standing to improve BP stability. They verbalized understanding. Will continue to follow acutely. Current recommendations remain appropriate. ? ?BP (all with abdominal binder donned):  ?144/68 supine ?143/68 sitting ?123/55 standing (symptomatic) ?100/53 standing ~3 min (symptomatic) ?122/94 while ambulating (symptomatic) ? ?  ?Recommendations for follow up therapy are one component of a multi-disciplinary discharge planning process, led by the attending physician.  Recommendations may be updated based on patient status, additional functional criteria and insurance authorization. ? ?Follow Up Recommendations ? Outpatient PT ?  ?  ?Assistance Recommended at Discharge Intermittent Supervision/Assistance   ?Patient can return home with the following A little help with bathing/dressing/bathroom;Assistance with cooking/housework;Direct supervision/assist for medications management;A little help with walking and/or transfers;Assist for transportation ?  ?Equipment Recommendations ? None recommended by PT  ?  ?Recommendations for Other Services   ? ? ?  ?Precautions / Restrictions Precautions ?Precautions: Fall;Shoulder ?Type of Shoulder Precautions: no shoulder movement ?Shoulder Interventions: Shoulder sling/immobilizer;At all times;Off for dressing/bathing/exercises ?Precaution Booklet Issued: Yes (comment) (per OT note) ?Precaution Comments: watch BP ?Required Braces or Orthoses: Splint/Cast ?Splint/Cast: RUE splint with non-op humerus fx ?Restrictions ?Weight Bearing Restrictions: Yes ?RUE Weight Bearing: Non weight bearing  ?  ? ?Mobility ? Bed Mobility ?Overal bed mobility: Modified Independent ?  ?  ?  ?  ?  ?  ?General bed mobility comments: Pt able to exit/enter L side of bed with HOB slightly elevated without assistance or use of R UE, extra time though. ?  ? ?Transfers ?Overall transfer level: Needs assistance ?Equipment used: None ?Transfers: Sit to/from Stand ?Sit to Stand: Supervision ?  ?  ?  ?  ?  ?General transfer comment: Supervision for safety, no LOB, good initiation to power up to stand from EOB 2x. ?  ? ?Ambulation/Gait ?Ambulation/Gait assistance: Min guard ?Gait Distance (Feet): 50 Feet ?Assistive device: None ?Gait Pattern/deviations: Step-through pattern ?Gait velocity: reduced ?Gait velocity interpretation: 1.31 - 2.62 ft/sec, indicative of limited community ambulator ?  ?General Gait Details: Pt ambulated to door and back in room to pt tolerance as he reported feeling fatigued in the legs and symptomatic with orthostatic hypotension. No LOB, min guard for safety. Educated pt on using rollator he has at home with L UE only to avoid WB on R in order to provide him a safe place to sit if he gets  orthostatic. Educated pt and wife on use of compression stockings, abdominal binder, and exercises prior to and when standing to improve BP. ? ? ?Stairs ?  ?  ?  ?  ?  ? ? ?Wheelchair Mobility ?  ? ?Modified Rankin (Stroke Patients Only) ?  ? ? ?  ?Balance Overall balance assessment: Mild deficits observed, not formally tested ?  ?  ?  ?  ?  ?  ?  ?  ?  ?  ?  ?  ?  ?  ?  ?  ?  ?  ?  ? ?  ?Cognition Arousal/Alertness: Awake/alert ?Behavior During Therapy: [facility] for tasks assessed/performed ?Overall Cognitive Status: Within Functional Limits for tasks assessed ?  ?  ?  ?  ?  ?  ?  ?  ?  ?  ?  ?  ?  ?  ?  ?  ?  ?  ?  ? ?  ?Exercises   ? ?  ?General Comments General comments (skin integrity, edema, etc.): BP: 144/68 supine, 143/68 sitting, 123/55 standing, 100/53 standing ~3 min, 122/94 while ambulating; all vitals taken with abdominal binder donned; HR 57 supine, 61 sitting, 67 standing, 74 stanidng ~3 min; educated pt on use of urinal at night to avoid getting up and having syncope episode at night as needed; educated pt and wife on elevating R UE; Educated pt and wife on use of compression stockings, abdominal binder, and exercises prior to and when standing to improve BP and to wait to walk away from surface when initally coming to stand to ensure safety; educated them on sit <> stands to improve functional leg strength and endurance ?  ?  ? ?Pertinent Vitals/Pain Pain Assessment ?Pain Assessment: Faces ?Faces Pain Scale: Hurts a little bit ?Pain Location: R UE ?Pain Descriptors / Indicators: Discomfort ?Pain Intervention(s): Limited activity within patient's tolerance, Monitored during session, Repositioned  ? ? ?Home Living   ?  ?  ?  ?  ?  ?  ?  ?  ?  ?   ?  ?Prior Function    ?  ?  ?   ? ?PT Goals (current goals can now be found in the care plan section) Acute Rehab PT Goals ?Patient Stated Goal: to go home ?PT Goal Formulation: With patient/family ?Time For Goal Achievement: 02/17/22 ?Potential to Achieve Goals:  Good ?Progress towards PT goals: Progressing toward goals ? ?  ?Frequency ? ? ? Min 3X/week ? ? ? ?  ?PT Plan Current plan remains appropriate  ? ? ?Co-evaluation   ?  ?  ?  ?  ? ?  ?AM-PAC PT "6 Clicks" Mobility   ?Outcome Measure ? Help needed turning from your back to your side while in a flat bed without using bedrails?: None ?Help needed moving from lying on your back to sitting on the side of a flat bed without using bedrails?: None ?Help needed moving to and from a bed to a chair (including a wheelchair)?: A Little ?Help needed standing up from a chair using your arms (e.g., wheelchair or bedside chair)?: A Little ?Help needed to walk in hospital room?: A Little ?Help needed climbing 3-5 steps with a railing? : A Little ?6 Click Score: 20 ? ?  ?End of Session Equipment Utilized During Treatment: Other (comment) (R UE sling; abdominal binder) ?Activity Tolerance: Patient tolerated treatment well;Other (comment) (limited by orthostatic hypotension) ?Patient left: in bed;with call bell/phone within reach;with family/visitor present ?Nurse Communication: Mobility  status ?PT Visit Diagnosis: Other abnormalities of gait and mobility (R26.89);Pain;Unsteadiness on feet (R26.81);Difficulty in walking, not elsewhere classified (R26.2) ?Pain - Right/Left: Right ?Pain - part of body: Arm ?  ? ? ?Time: 5300-5110 ?PT Time Calculation (min) (ACUTE ONLY): 23 min ? ?Charges:  $Gait Training: 8-22 mins ?$Therapeutic Activity: 8-22 mins          ?          ? ?Moishe Spice, PT, DPT ?Acute Rehabilitation Services  ?Pager: [phone] ?Office: [phone] ? ? ? ?Maretta Bees Pettis ?02/12/2022, 11:39 AM ? ?

## 2022-02-12 NOTE — TOC Transition Note (Signed)
Transition of Care (TOC) - CM/SW Discharge Note ? ? ?Patient Details  ?Name: Lance Pham ?MRN: [id number] ?Date of Birth: Nov 14, 1950 ? ?Transition of Care (TOC) CM/SW Contact:  ?Pollie Friar, RN ?Phone Number: ?02/12/2022, 1:22 PM ? ? ?Clinical Narrative:    ?Patient is discharging home with outpatient therapy through [facility]. Information faxed to [facility]. Information on the AVS.  ?Pt has transportation home today. ? ? ?Final next level of care: OP Rehab ?Barriers to Discharge: No Barriers Identified ? ? ?Patient Goals and CMS Choice ?  ?  ?Choice offered to / list presented to : Patient ? ?Discharge Placement ?  ?           ?  ?  ?  ?  ? ?Discharge Plan and Services ?  ?Discharge Planning Services: CM Consult ?           ?  ?  ?  ?  ?  ?  ?  ?  ?  ?  ? ?Social Determinants of Health (SDOH) Interventions ?  ? ? ?Readmission Risk Interventions ?   ? View : No data to display.  ?  ?  ?  ? ? ? ? ? ?

## 2022-02-13 ENCOUNTER — Other Ambulatory Visit: Payer: Self-pay | Admitting: Cardiology

## 2022-02-17 ENCOUNTER — Telehealth: Payer: Self-pay | Admitting: Orthopedic Surgery

## 2022-02-17 LAB — UPEP/UIFE/LIGHT CHAINS/TP, 24-HR UR
% BETA, Urine: 19.4 %
ALPHA 1 URINE: 2 %
Albumin, U: 12.8 %
Alpha 2, Urine: 3.8 %
Free Kappa Lt Chains,Ur: 639.37 mg/L — ABNORMAL HIGH (ref 1.17–86.46)
Free Kappa/Lambda Ratio: 111 — ABNORMAL HIGH (ref 1.83–14.26)
Free Lambda Lt Chains,Ur: 5.76 mg/L (ref 0.27–15.21)
GAMMA GLOBULIN URINE: 62 %
M-SPIKE %, Urine: 48 % — ABNORMAL HIGH
M-Spike, Mg/24 Hr: 403 mg/24 hr — ABNORMAL HIGH
Total Protein, Urine-Ur/day: 840 mg/24 hr — ABNORMAL HIGH (ref 30–150)
Total Protein, Urine: 33.6 mg/dL
Total Volume: 2500

## 2022-02-17 NOTE — Telephone Encounter (Signed)
Pt's wife Juliann Pulse called requesting an appt for pt. Pt was admitted into [facility] hospital and seen dr. Marlou Sa. Dr. Marlou Sa has no opening until 03/10/22. Pt discharged papers states to be seen by Dr Marlou Sa in I week for fractured right arm. Please call pt's wife and open slot. Phone number is [phone]. ?

## 2022-02-17 NOTE — Telephone Encounter (Signed)
Tried calling patient to get them worked in this week. No answer. Can you please follow up with them on Tuesday? ?

## 2022-02-18 ENCOUNTER — Telehealth: Payer: Self-pay | Admitting: Orthopedic Surgery

## 2022-02-18 ENCOUNTER — Other Ambulatory Visit: Payer: Self-pay | Admitting: Internal Medicine

## 2022-02-18 DIAGNOSIS — C9000 Multiple myeloma not having achieved remission: Secondary | ICD-10-CM

## 2022-02-18 NOTE — Telephone Encounter (Signed)
Pt's wife Juliann Pulse returned call to [facility] Purpura [facility] to set an appt for pt. Please call pt at [phone]. ?

## 2022-02-19 NOTE — Telephone Encounter (Signed)
IC worked in for Friday a.m. ?

## 2022-02-21 ENCOUNTER — Telehealth: Payer: Self-pay | Admitting: Hematology and Oncology

## 2022-02-21 ENCOUNTER — Ambulatory Visit: Payer: PPO | Admitting: Orthopedic Surgery

## 2022-02-21 ENCOUNTER — Ambulatory Visit (INDEPENDENT_AMBULATORY_CARE_PROVIDER_SITE_OTHER): Payer: PPO

## 2022-02-21 DIAGNOSIS — S4991XA Unspecified injury of right shoulder and upper arm, initial encounter: Secondary | ICD-10-CM | POA: Diagnosis not present

## 2022-02-21 NOTE — Telephone Encounter (Signed)
Scheduled appt per 4/18 referral. Pt is aware of appt date and time. Pt is aware to arrive 15 mins prior to appt time and to bring and updated insurance card. Pt is aware of appt location.   ?

## 2022-02-22 ENCOUNTER — Encounter: Payer: Self-pay | Admitting: Orthopedic Surgery

## 2022-02-22 NOTE — Progress Notes (Signed)
? ?Office Visit Note ?  ?Patient: Lance Pham           ?Date of Birth: 1951-05-13           ?MRN: [id number] ?Visit Date: 02/21/2022 ?Requested by: Lance Nasuti, MD ?[address] ?[place],  [place] [address] ?PCP: Lance Nasuti, MD ? ?Subjective: ?Chief Complaint  ?Patient presents with  ? Other  ?  Humerus injury ?DOI 02/09/22  ? ? ?HPI: Lance Pham is a 71 year old patient with right arm pain.  Had a fall almost 2 weeks ago.  Was hospitalized with low blood pressure and anemia.  Since he was discharged laboratory values have come back and show that he has multiple myeloma.  He was having some prodromal symptoms in that arm prior to the fall at church.  That was going on about a month beforehand.  I saw him briefly in the hospital and coaptation splint was placed.  Presents now for follow-up.  He is considering where to go for cancer treatment between [facility] and [facility]. ?             ?ROS: All systems reviewed are negative as they relate to the chief complaint within the history of present illness.  Patient denies  fevers or chills. ? ? ?Assessment & Plan: ?Visit Diagnoses:  ?1. Arm injury, right, initial encounter   ? ? ?Plan: Impression is likely pathologic right humeral shaft fracture midshaft with some potential to heal.  Patient has many many medical comorbidities.  If fracture fixation is required would likely be intramedullary nail.  For now although the fracture is manipulated and the Sarmiento brace is placed.  Overall alignment is improved based on postreduction radiographs.  Come back in about 10 days and we will assess how "sticky" the fracture is becoming to guide whether or not we proceed with surgical intervention ? ?Follow-Up Instructions: Return in about 10 days (around 03/03/2022).  ? ?Orders:  ?Orders Placed This Encounter  ?Procedures  ? XR Humerus Right  ? Ambulatory referral to Hematology / Oncology  ? ?No orders of the defined types were placed in this  encounter. ? ? ? ? Procedures: ?No procedures performed ? ? ?Clinical Data: ?No additional findings. ? ?Objective: ?Vital Signs: There were no vitals taken for this visit. ? ?Physical Exam:  ? ?Constitutional: Patient appears well-developed ?HEENT:  ?Head: Normocephalic ?Eyes:EOM are normal ?Neck: Normal range of motion ?Cardiovascular: Normal rate ?Pulmonary/chest: Effort normal ?Neurologic: Patient is alert ?Skin: Skin is warm ?Psychiatric: Patient has normal mood and affect ? ? ?Ortho Exam: Ortho exam demonstrates some swelling in the humeral shaft region.  EPL FPL interosseous function intact.  Radial pulses intact bilaterally.  There is motion at the fracture site. ? ?Specialty Comments:  ?No specialty comments available. ? ?Imaging: ?No results found. ? ? ?PMFS History: ?Patient Active Problem List  ? Diagnosis Date Noted  ? Symptomatic anemia 02/09/2022  ? Chronic diastolic CHF (congestive heart failure), NYHA class 3 ([place]) 12/13/2021  ? History of CVA (cerebrovascular accident) 12/13/2021  ? POTS (postural orthostatic tachycardia syndrome) 12/13/2021  ? COVID-19   ? Sleep apnea   ? PONV (postoperative nausea and vomiting)   ? Hypertension   ? BBB (bundle branch block)   ? Cataract   ? Clotting disorder ([place])   ? Arthritis   ? A-fib ([place])   ? Acute intermittent porphyria ([place]) 12/04/2020  ? Acute upper respiratory infection 12/04/2020  ? Allergic rhinitis 12/04/2020  ?  Benign prostatic hyperplasia with lower urinary tract symptoms 12/04/2020  ? Constipation 12/04/2020  ? 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[place]) 12/04/2020  ? Cutaneous abscess 12/04/2020  ? Gastro-esophageal reflux disease without esophagitis 12/04/2020  ? Generalized anxiety disorder 12/04/2020  ? Hyperglycemia due to type 2 diabetes mellitus ([place]) 12/04/2020  ? Hypothyroidism 12/04/2020  ? Iron deficiency 12/04/2020  ? 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 Mixed hyperlipidemia 12/04/2020  ? Moderate recurrent major depression ([place]) 12/04/2020  ?  Myopathy 12/04/2020  ? Other long term (current) drug therapy 12/04/2020  ? Other vitamin B12 deficiency anemias 12/04/2020  ? Overactive bladder 12/04/2020  ? Peripheral vascular disease ([place]) 12/04/2020  ? Recurrent major depression in full remission ([place]) 12/04/2020  ? 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 Vitamin D deficiency 12/04/2020  ? Stroke (cerebrum) ([place]) 11/21/2020  ? Episodic lightheadedness 10/14/2019  ? 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 Palpitations 10/14/2019  ? RBBB (right bundle branch block) 10/14/2019  ? CAD in native artery 06/22/2019  ? Paroxysmal atrial fibrillation ([place]) 06/22/2019  ? Shortness of breath 06/08/2019  ? Abnormal myocardial perfusion study 07/14/2018  ? Chest discomfort 07/14/2018  ? Dyslipidemia 07/14/2018  ? Essential hypertension 07/14/2018  ? 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 ?Past Medical History:  ?Diagnosis Date  ? A-fib ([place])   ? hx of  ? Abnormal myocardial perfusion study 07/14/2018  ? Acute intermittent porphyria ([place]) 12/04/2020  ? Acute upper respiratory infection 12/04/2020  ? Allergic rhinitis 12/04/2020  ? Arthritis   ? RIGHT knee  ? BBB (bundle branch block)   ? hx of R BBB  ? Benign prostatic hyperplasia with lower urinary tract symptoms 12/04/2020  ? Binge eating disorder 12/04/2020  ? CAD in native artery 06/22/2019  ? Cataract   ? bilateral sx  ? Chest discomfort 07/14/2018  ? Chill 12/04/2020  ? Clotting disorder ([place])   ? has a genetic clotting dx -unknown name  ? Constipation 12/04/2020  ? 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[place]) 12/04/2020  ? Cutaneous abscess 12/04/2020  ? Dyslipidemia 07/14/2018  ? Episodic lightheadedness 10/14/2019  ? Essential hypertension 07/14/2018  ? Fever 12/04/2020  ? Gastro-esophageal reflux disease without esophagitis 12/04/2020  ? Generalized anxiety disorder 12/04/2020  ? Hyperglycemia due to type 2 diabetes mellitus ([place]) 12/04/2020  ? 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 Hypertension   ? on  meds  ? Hypothyroidism 12/04/2020  ? Iron deficiency 12/04/2020  ? 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 Mixed hyperlipidemia 12/04/2020  ? Moderate recurrent major depression ([place]) 12/04/2020  ? Myopathy 12/04/2020  ? Other long term (current) drug therapy 12/04/2020  ? Other vitamin B12 deficiency anemias 12/04/2020  ? Overactive bladder 12/04/2020  ? Palpitations 10/14/2019  ? Paroxysmal atrial fibrillation ([place]) 06/22/2019  ? Peripheral vascular disease ([place]) 12/04/2020  ? 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 Formatting of this note might be different from the original. 3 mm pituitary microadenoma. Likely nonfunctional.  ? PONV (postoperative nausea and vomiting)   ? RBBB (right bundle branch block) 10/14/2019  ? Recurrent major depression in full remission ([place]) 12/04/2020  ? Shortness of breath 06/08/2019  ? Sleep apnea   ? use CPAP  ? Stroke (cerebrum) ([place]) 11/21/2020  ? 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 Vitamin D deficiency 12/04/2020  ? Vomiting without nausea 12/04/2020  ?  ?Family History  ?Problem Relation Age of Onset  ? Colon cancer Mother 88  ? Colon polyps Mother 23  ? Thyroid cancer  Father 47  ?     parathyroid CA  ? Colon polyps Sister   ? Colon polyps Brother   ? Colon polyps Sister   ? Colon polyps Sister   ? Colon polyps Sister   ? Esophageal cancer Neg Hx   ? Stomach cancer Neg Hx   ? Rectal cancer Neg Hx   ?  ?Past Surgical History:  ?Procedure Laterality Date  ? CATARACT EXTRACTION, BILATERAL    ? CHOLECYSTECTOMY    ? COLONOSCOPY  2017  ? RG-mag citrtae (good)-TICS/TA x 1  ? FOOT SURGERY Left   ? tendon link  ? HAMMER TOE SURGERY    ? KNEE SURGERY Right   ? LAPAROSCOPIC GASTRIC BANDING    ? POLYPECTOMY  2017  ? TA x 1  ? WISDOM TOOTH EXTRACTION    ? WRIST RECONSTRUCTION Right   ? ?Social History  ? ?Occupational History  ? Not on file  ?Tobacco Use  ? Smoking status: Never  ? Smokeless tobacco: Never  ?Vaping Use  ? Vaping Use: Never used  ?Substance and Sexual  Activity  ? Alcohol use: Never  ? Drug use: Never  ? Sexual activity: Not on file  ? ? ? ? ? ?

## 2022-02-24 ENCOUNTER — Telehealth: Payer: Self-pay | Admitting: Orthopedic Surgery

## 2022-02-24 NOTE — Progress Notes (Signed)
?Cardiology Office Note:   ? ?Date:  02/25/2022  ? ?ID:  Lance Pham, DOB 12-21-1950, MRN [id number] ? ?PCP:  Bonnita Nasuti, MD  ?Cardiologist:  Shirlee More, MD   ? ?Referring MD: Bonnita Nasuti, MD  ? ? ?ASSESSMENT:   ? ?1. Orthostatic hypotension   ?2. Anemia, unspecified type   ?3. Multiple myeloma not having achieved remission ([place])   ? ?PLAN:   ? ?In order of problems listed above: ? ?His clinical case is quickly evident from idiopathic orthostatic hypotension autonomic dysfunction post-COVID to severe hypotension anemia associated with myeloma.  Discharged on hospital actually decreased his dose of midodrine.  With a questionable recent steroid usage I would give him stress dose steroids from cortisol level and increase midodrine to 10 mg 3 times daily I suspect he will require further transfusion he received 1 unit of packed cells hemoglobin on the 13th was 8.0 and will need to be seen by Dr. Hinton Rao as an inpatient. ? ? ?Next appointment: To be determined ? ? ?Medication Adjustments/Labs and Tests Ordered: ?Current medicines are reviewed at length with the patient today.  Concerns regarding medicines are outlined above.  ?No orders of the defined types were placed in this encounter. ? ?No orders of the defined types were placed in this encounter. ? ? ?Chief complaint seen here today at the request of my partner Dr. Harrington Challenger he was hypotensive during recent admission [facility] ?His wife tells me they received a phone call that he has multiple myeloma and they are awaiting evaluation by oncology [facility] ?He is profoundly ill the ambulance has been to the home he had an episode where he felt very weak with short of breath they came and evaluated him he is hypotensive at home and his wife is having trouble managing him and he is having trouble sitting up because of low blood pressure ?In my office he is quite symptomatic with a blood pressure of 80/40 sitting. ?I called discussed with Dr.  Hosie Poisson we decided the best course of events was to send him to the emergency room I think he is going to be admitted to the hospital for hypotension likely require repeat transfusion he received 1 unit last admission and she will plan to see him as an inpatient tomorrow morning for his myeloma ? ? ?History of Present Illness:   ? ?Lance Pham is a 71 y.o. male with a hx of symptomatic orthostatic hypotension due to dysautonomia in the context of COVID-19 severe infection fatigue has resolved he feels back to normal and has had no further episodes of fall or fainting, paroxysmal atrial fibrillation on chronic anticoagulation hypertension hyperlipidemia and CAD  last seen 11/13/2020.Underwent LHC on 06/15/2019 that showed distal LAD disease that was no amenable to intervention, and nonobstructive disease otherwise  ? ?He was seen by Dr. Wyatt Haste [facility] consultation 02/12/2022 orthostatic hypotension and autonomic dysfunction.  He was placed on midodrine.  He was recommended to use an abdominal binder.  He was severely anemic with a hemoglobin of 7.8 at that time.  He had an echocardiogram in [facility] which showed normal left ventricular ejection fraction.  His discharge diagnoses symptomatic anemia with fracture of the mid humerus. ? ?Compliance with diet, lifestyle and medications: Yes ? ?I think he has recently been on steroids ?He feels extremely weak has had episodes of profound weakness with the ambulance to the home and has been intermittently short of breath. ?  Past Medical History:  ?Diagnosis Date  ? A-fib ([place])   ? hx of  ? Abnormal myocardial perfusion study 07/14/2018  ? Acute intermittent porphyria ([place]) 12/04/2020  ? Acute upper respiratory infection 12/04/2020  ? Allergic rhinitis 12/04/2020  ? Arthritis   ? RIGHT knee  ? BBB (bundle branch block)   ? hx of R BBB  ? Benign prostatic hyperplasia with lower urinary tract symptoms 12/04/2020  ? Binge eating disorder  12/04/2020  ? CAD in native artery 06/22/2019  ? Cataract   ? bilateral sx  ? Chest discomfort 07/14/2018  ? Chill 12/04/2020  ? Clotting disorder ([place])   ? has a genetic clotting dx -unknown name  ? Constipation 12/04/2020  ? 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[place]) 12/04/2020  ? Cutaneous abscess 12/04/2020  ? Dyslipidemia 07/14/2018  ? Episodic lightheadedness 10/14/2019  ? Essential hypertension 07/14/2018  ? Fever 12/04/2020  ? Gastro-esophageal reflux disease without esophagitis 12/04/2020  ? Generalized anxiety disorder 12/04/2020  ? Hyperglycemia due to type 2 diabetes mellitus ([place]) 12/04/2020  ? 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 Hypertension   ? on meds  ? Hypothyroidism 12/04/2020  ? Iron deficiency 12/04/2020  ? 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 Mixed hyperlipidemia 12/04/2020  ? Moderate recurrent major depression ([place]) 12/04/2020  ? Multiple myeloma ([place])   ? Myopathy 12/04/2020  ? Orthostatic hypotension   ? Other long term (current) drug therapy 12/04/2020  ? Other vitamin B12 deficiency anemias 12/04/2020  ? Overactive bladder 12/04/2020  ? Palpitations 10/14/2019  ? Paroxysmal atrial fibrillation ([place]) 06/22/2019  ? Peripheral vascular disease ([place]) 12/04/2020  ? 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 Formatting of this note might be different from the original. 3 mm pituitary microadenoma. Likely nonfunctional.  ? PONV (postoperative nausea and vomiting)   ? RBBB (right bundle branch block) 10/14/2019  ? Recurrent major depression in full remission ([place]) 12/04/2020  ? Shortness of breath 06/08/2019  ? Sleep apnea   ? use CPAP  ? Stroke (cerebrum) ([place]) 11/21/2020  ? 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 Vitamin D deficiency 12/04/2020  ? Vomiting without nausea 12/04/2020  ? ? ?Past Surgical History:  ?Procedure Laterality Date  ? CATARACT EXTRACTION, BILATERAL    ? CHOLECYSTECTOMY    ? COLONOSCOPY  2017  ? RG-mag citrtae (good)-TICS/TA x 1   ? FOOT SURGERY Left   ? tendon link  ? HAMMER TOE SURGERY    ? KNEE SURGERY Right   ? LAPAROSCOPIC GASTRIC BANDING    ? POLYPECTOMY  2017  ? TA x 1  ? WISDOM TOOTH EXTRACTION    ? WRIST RECONSTRUCTION Right   ? ? ?Current Medications: ?Current Meds  ?Medication Sig  ? acetaminophen (TYLENOL) 500 MG tablet Take 1,000 mg by mouth every 6 (six) hours as needed for mild pain or headache.  ? albuterol (VENTOLIN HFA) 108 (90 Base) MCG/ACT inhaler Inhale 1 puff into the lungs every 4 (four) hours as needed for wheezing or shortness of breath.  ? apixaban (ELIQUIS) 5 MG TABS tablet Take 5 mg by mouth 2 (two) times daily.  ? benzonatate (TESSALON) 200 MG capsule Take 200 mg by mouth 3 (three) times daily as needed for cough.  ? buPROPion (WELLBUTRIN SR) 150 MG 12 hr tablet Take 150 mg by mouth daily.  ? cyanocobalamin (,VITAMIN B-12,) 1000 MCG/ML injection Inject 1 mL (1,000 mcg total) into the muscle every 30 (thirty) days.  ? ferrous sulfate 324 (65 Fe) MG TBEC  Take 324 mg by mouth daily after supper.  ? FLUoxetine (PROZAC) 40 MG capsule Take 40 mg by mouth at bedtime.  ? folic acid (FOLVITE) 063 MCG tablet Take 400 mcg by mouth at bedtime.  ? midodrine (PROAMATINE) 5 MG tablet Take 1 tablet (5 mg total) by mouth 3 (three) times daily.  ? polyethylene glycol (MIRALAX / GLYCOLAX) 17 g packet Take 17 g by mouth daily as needed for mild constipation or moderate constipation.  ? pyridostigmine (MESTINON) 60 MG tablet Take 0.5 tablets (30 mg total) by mouth 2 (two) times daily.  ? senna-docusate (SENOKOT-S) 8.6-50 MG tablet Take 2 tablets by mouth at bedtime as needed for mild constipation or moderate constipation.  ?  ? ?Allergies:   Nsaids and Tamiflu [oseltamivir]  ? ?Social History  ? ?Socioeconomic History  ? Marital status: Married  ?  Spouse name: Not on file  ? Number of children: Not on file  ? Years of education: Not on file  ? Highest education level: Not on file  ?Occupational History  ? Not on file  ?Tobacco Use   ? Smoking status: Never  ? Smokeless tobacco: Never  ?Vaping Use  ? Vaping Use: Never used  ?Substance and Sexual Activity  ? Alcohol use: Never  ? Drug use: Never  ? Sexual activity: Not on file  ?Other Topics

## 2022-02-24 NOTE — Telephone Encounter (Signed)
02/21/22 ov note faxed to [facility] to help support need for wheelchair per their request..[phone] ?

## 2022-02-25 ENCOUNTER — Encounter: Payer: Self-pay | Admitting: Cardiology

## 2022-02-25 ENCOUNTER — Ambulatory Visit (INDEPENDENT_AMBULATORY_CARE_PROVIDER_SITE_OTHER): Payer: PPO | Admitting: Cardiology

## 2022-02-25 ENCOUNTER — Encounter: Payer: Self-pay | Admitting: *Deleted

## 2022-02-25 VITALS — BP 90/60 | HR 74 | Ht 74.0 in | Wt 263.0 lb

## 2022-02-25 DIAGNOSIS — I517 Cardiomegaly: Secondary | ICD-10-CM | POA: Diagnosis not present

## 2022-02-25 DIAGNOSIS — I951 Orthostatic hypotension: Secondary | ICD-10-CM

## 2022-02-25 DIAGNOSIS — C9000 Multiple myeloma not having achieved remission: Secondary | ICD-10-CM

## 2022-02-25 DIAGNOSIS — D649 Anemia, unspecified: Secondary | ICD-10-CM

## 2022-02-26 ENCOUNTER — Ambulatory Visit: Payer: PPO | Admitting: Surgical

## 2022-02-26 DIAGNOSIS — C9000 Multiple myeloma not having achieved remission: Secondary | ICD-10-CM | POA: Diagnosis not present

## 2022-02-26 DIAGNOSIS — D649 Anemia, unspecified: Secondary | ICD-10-CM | POA: Diagnosis not present

## 2022-02-27 ENCOUNTER — Other Ambulatory Visit (HOSPITAL_COMMUNITY)
Admission: RE | Admit: 2022-02-27 | Discharge: 2022-02-27 | Disposition: A | Discharge status: Home / Self Care | Payer: PPO | Source: Ambulatory Visit | Attending: *Deleted | Admitting: *Deleted

## 2022-02-27 DIAGNOSIS — D649 Anemia, unspecified: Secondary | ICD-10-CM | POA: Diagnosis not present

## 2022-02-27 DIAGNOSIS — C9000 Multiple myeloma not having achieved remission: Secondary | ICD-10-CM | POA: Diagnosis not present

## 2022-02-28 ENCOUNTER — Telehealth: Payer: Self-pay | Admitting: Cardiology

## 2022-02-28 ENCOUNTER — Telehealth: Payer: Self-pay | Admitting: Oncology

## 2022-02-28 ENCOUNTER — Encounter: Payer: Self-pay | Admitting: Surgical

## 2022-02-28 ENCOUNTER — Ambulatory Visit (INDEPENDENT_AMBULATORY_CARE_PROVIDER_SITE_OTHER): Payer: PPO

## 2022-02-28 ENCOUNTER — Ambulatory Visit: Payer: PPO | Admitting: Surgical

## 2022-02-28 DIAGNOSIS — S4991XA Unspecified injury of right shoulder and upper arm, initial encounter: Secondary | ICD-10-CM

## 2022-02-28 NOTE — Telephone Encounter (Signed)
Patient with diagnosis of afib on Eliquis for anticoagulation.   ? ?Procedure:  Intramedullary Nail of the R Humerus  ?Date of procedure: 03/06/22 ? ?CHA2DS2-VASc Score = 5  ? This indicates a 7.2% annual risk of stroke. ?The patient's score is based upon: ?CHF History: 0 ?HTN History: 1 ?Diabetes History: 0 ?Stroke History: 2 ?Vascular Disease History: 1 ?Age Score: 1 ?Gender Score: 0 ?  ?  ? ?CrCl 78 ml/min ? ?Per office protocol, patient can hold Eliquis for 2 days prior to procedure.   ? ?He is high risk off anticoagulation due to hx of stroke. ACC/AHA does not recommend bridging. He will need to resume Eliquis as soon as safely possible. ?

## 2022-02-28 NOTE — Telephone Encounter (Signed)
Please review Eliquis request ?

## 2022-02-28 NOTE — Telephone Encounter (Signed)
? ?  Pre-operative Risk Assessment  ?  ?Patient Name: Lance Pham  ?DOB: Aug 17, 1951 ?MRN: [id number]  ? ?  ? ?Request for Surgical Clearance   ? ?Procedure:  Intramedullary Nail of the R Humerus  ? ?Date of Surgery:  Clearance 03/06/22                              ?   ?Surgeon:  Dr. Alphonzo Severance ?Surgeon's Group or Practice Name:  [facility] ?Phone number:  [phone] ?Fax number:  [phone] ?  ?Type of Clearance Requested:   ?- Medical  ?- Pharmacy:  Hold Apixaban (Eliquis) TBD by Cardiology ?  ?Type of Anesthesia:  General  ?  ?Additional requests/questions:   ? ?Signed, ?Johnna Acosta   ?02/28/2022, 4:03 PM   ?

## 2022-02-28 NOTE — Telephone Encounter (Signed)
Contacted pt to schedule his appt (I have scheduled one to hold a spot for him), unable to reach via phone but vm was left. ? ?I asked pt to call our office to confirm appt and also to clarify whether he wanted care here in [place] or [facility] since he is already scheduled with Dr. Lorenso Courier for Thursday. ? ?f/u ?Received: Yesterday ?[name], MD  Belva Chimes, LPN; Neva Seat; [place], [place] ?New pt seen in hospital with mult myeloma.  I did BM today and he will need f/u next week for results.  ?Pls look for path by Tues.  I have an opening at 1:30 pm on Wed.  ?

## 2022-02-28 NOTE — Progress Notes (Addendum)
? ?Office Visit Note ?  ?Patient: Lance Pham           ?Date of Birth: [birth year]           ?MRN: [id number] ?Visit Date: 02/28/2022 ?Requested by: Bonnita Nasuti, MD ?[address] ?[place],   [address] ?PCP: Bonnita Nasuti, MD ? ?Subjective: ?Chief Complaint  ?Patient presents with  ? Other  ?  Recheck fracture  ? ? ?HPI: Lance Pham is a 71 y.o. male who presents to the office complaining of right arm pain.  Patient returns for reevaluation of midshaft humerus fracture.  He is about 3 weeks out from injury and has recently had confirmation of diagnosis of multiple myeloma.  Having some difficulty with blood pressure as a result from the multiple myeloma that he is seeing Dr. Bettina Gavia for.  He is currently in Sarmiento brace and not lifting with the arm.  Coming back for recheck on fracture alignment and to see if there is any callus formation.  Denies any significant numbness or tingling in the injured arm..   ?             ?ROS: All systems reviewed are negative as they relate to the chief complaint within the history of present illness.  Patient denies fevers or chills. ? ?Assessment & Plan: ?Visit Diagnoses:  ?1. Arm injury, right, initial encounter   ? ? ?Plan: Patient is a 71 year old male who presents for reevaluation of humeral shaft fracture of the right arm.  He has had 3 weeks since injury with Sarmiento brace over the last week and coaptation splint prior to that.  No callus formation noted on radiographs today though the fracture is well aligned.  Neurovascularly intact on exam today.  Fracture is very mobile at the fracture site with no significant evidence of any callus formation by exam or by radiographs.  Plan based on this is to proceed with intramedullary nail of humeral shaft fracture of the right arm.  He will need cardiac risk stratification by his cardiologist Dr. Bettina Gavia who has been seeing him for his orthostatic hypotension; will need to be off Eliquis for procedure.  Recent  diagnosis of multiple myeloma.  Procedure will be posted for next Thursday with overnight stay and likely discharge the next following day. ? ?Follow-Up Instructions: No follow-ups on file.  ? ?Orders:  ?Orders Placed This Encounter  ?Procedures  ? XR Humerus Right  ? ?No orders of the defined types were placed in this encounter. ? ? ? ? Procedures: ?No procedures performed ? ? ?Clinical Data: ?No additional findings. ? ?Objective: ?Vital Signs: There were no vitals taken for this visit. ? ?Physical Exam:  ?Constitutional: Patient appears well-developed ?HEENT:  ?Head: Normocephalic ?Eyes:EOM are normal ?Neck: Normal range of motion ?Cardiovascular: Normal rate ?Pulmonary/chest: Effort normal ?Neurologic: Patient is alert ?Skin: Skin is warm ?Psychiatric: Patient has normal mood and affect ? ?Ortho Exam: Ortho exam demonstrates right arm with intact EPL, FPL, finger abduction, finger adduction.  Intact wrist extension.  2+ radial pulse of the right upper extremity.  He was taken out of the Sarmiento brace and the fracture site was stressed; there is no significant resistance to moving the opposing ends of the fracture and there is palpable clicking of the 2 ends of the fracture.  Patient was placed back in Sarmiento brace and neurovascular exam performed again with same result as above.  Significant swelling of the mid humeral region.  No blisters noted. ? ?  Specialty Comments:  ?No specialty comments available. ? ?Imaging: ?No results found. ? ? ?PMFS History: ?Patient Active Problem List  ? Diagnosis Date Noted  ? Multiple myeloma ([place]) 02/25/2022  ? Symptomatic anemia 02/09/2022  ? Chronic diastolic CHF (congestive heart failure), NYHA class 3 ([place]) 12/13/2021  ? History of CVA (cerebrovascular accident) 12/13/2021  ? POTS (postural orthostatic tachycardia syndrome) 12/13/2021  ? COVID-19   ? Sleep apnea   ? PONV (postoperative nausea and vomiting)   ? Hypertension   ? BBB (bundle branch block)   ? Cataract   ?  Clotting disorder ([place])   ? Arthritis   ? A-fib ([place])   ? Acute intermittent porphyria ([place]) 12/04/2020  ? Acute upper respiratory infection 12/04/2020  ? Allergic rhinitis 12/04/2020  ? Benign prostatic hyperplasia with lower urinary tract symptoms 12/04/2020  ? Constipation 12/04/2020  ? 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[place]) 12/04/2020  ? Cutaneous abscess 12/04/2020  ? Gastro-esophageal reflux disease without esophagitis 12/04/2020  ? Generalized anxiety disorder 12/04/2020  ? Hyperglycemia due to type 2 diabetes mellitus ([place]) 12/04/2020  ? Hypothyroidism 12/04/2020  ? Iron deficiency 12/04/2020  ? 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 Mixed hyperlipidemia 12/04/2020  ? Moderate recurrent major depression ([place]) 12/04/2020  ? Myopathy 12/04/2020  ? Other long term (current) drug therapy 12/04/2020  ? Other vitamin B12 deficiency anemias 12/04/2020  ? Overactive bladder 12/04/2020  ? Peripheral vascular disease ([place]) 12/04/2020  ? Recurrent major depression in full remission ([place]) 12/04/2020  ? 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 Vitamin D deficiency 12/04/2020  ? Stroke (cerebrum) ([place]) 11/21/2020  ? Episodic lightheadedness 10/14/2019  ? 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 Palpitations 10/14/2019  ? RBBB (right bundle branch block) 10/14/2019  ? CAD in native artery 06/22/2019  ? Paroxysmal atrial fibrillation ([place]) 06/22/2019  ? Shortness of breath 06/08/2019  ? Abnormal myocardial perfusion study 07/14/2018  ? Chest discomfort 07/14/2018  ? Dyslipidemia 07/14/2018  ? Essential hypertension 07/14/2018  ? 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 ?Past Medical History:  ?Diagnosis Date  ? A-fib ([place])   ? hx of  ? Abnormal myocardial perfusion study 07/14/2018  ? Acute intermittent porphyria ([place]) 12/04/2020  ? Acute upper respiratory infection 12/04/2020  ? Allergic rhinitis 12/04/2020  ? Arthritis   ? RIGHT knee  ? BBB (bundle branch block)   ? hx of R BBB  ?  Benign prostatic hyperplasia with lower urinary tract symptoms 12/04/2020  ? Binge eating disorder 12/04/2020  ? CAD in native artery 06/22/2019  ? Cataract   ? bilateral sx  ? Chest discomfort 07/14/2018  ? Chill 12/04/2020  ? Clotting disorder ([place])   ? has a genetic clotting dx -unknown name  ? Constipation 12/04/2020  ? 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[place]) 12/04/2020  ? Cutaneous abscess 12/04/2020  ? Dyslipidemia 07/14/2018  ? Episodic lightheadedness 10/14/2019  ? Essential hypertension 07/14/2018  ? Fever 12/04/2020  ? Gastro-esophageal reflux disease without esophagitis 12/04/2020  ? Generalized anxiety disorder 12/04/2020  ? Hyperglycemia due to type 2 diabetes mellitus ([place]) 12/04/2020  ? 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 Hypertension   ? on meds  ? Hypothyroidism 12/04/2020  ? Iron deficiency 12/04/2020  ? 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 Mixed hyperlipidemia 12/04/2020  ? Moderate recurrent major depression ([place]) 12/04/2020  ? Multiple myeloma ([place])   ? Myopathy 12/04/2020  ? Orthostatic hypotension   ? Other long term (current) drug therapy 12/04/2020  ?  Other vitamin B12 deficiency anemias 12/04/2020  ? Overactive bladder 12/04/2020  ? Palpitations 10/14/2019  ? Paroxysmal atrial fibrillation ([place]) 06/22/2019  ? Peripheral vascular disease ([place]) 12/04/2020  ? 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 Formatting of this note might be different from the original. 3 mm pituitary microadenoma. Likely nonfunctional.  ? PONV (postoperative nausea and vomiting)   ? RBBB (right bundle branch block) 10/14/2019  ? Recurrent major depression in full remission ([place]) 12/04/2020  ? Shortness of breath 06/08/2019  ? Sleep apnea   ? use CPAP  ? Stroke (cerebrum) ([place]) 11/21/2020  ? 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 Vitamin D deficiency 12/04/2020  ? Vomiting without nausea 12/04/2020  ?  ?Family History  ?Problem Relation Age of Onset  ? Colon cancer Mother  28  ? Colon polyps Mother 68  ? Thyroid cancer Father 74  ?     parathyroid CA  ? Colon polyps Sister   ? Colon polyps Brother   ? Colon polyps Sister   ? Colon polyps Sister   ? Colon polyps Sister

## 2022-03-03 ENCOUNTER — Telehealth: Payer: Self-pay | Admitting: *Deleted

## 2022-03-03 ENCOUNTER — Encounter: Payer: Self-pay | Admitting: Physician Assistant

## 2022-03-03 ENCOUNTER — Telehealth: Payer: Self-pay | Admitting: Orthopedic Surgery

## 2022-03-03 ENCOUNTER — Ambulatory Visit (INDEPENDENT_AMBULATORY_CARE_PROVIDER_SITE_OTHER): Payer: PPO | Admitting: Physician Assistant

## 2022-03-03 VITALS — BP 144/58 | HR 71

## 2022-03-03 DIAGNOSIS — Z01810 Encounter for preprocedural cardiovascular examination: Secondary | ICD-10-CM | POA: Diagnosis not present

## 2022-03-03 NOTE — Telephone Encounter (Signed)
Please call the SP --Juliann Pulse at [phone] to discuss surgery  ?

## 2022-03-03 NOTE — Progress Notes (Signed)
? ?Virtual Visit via Telephone Note  ? ?This visit type was conducted due to national recommendations for restrictions regarding the COVID-19 Pandemic (e.g. social distancing) in an effort to limit this patient's exposure and mitigate transmission in our community.  Due to his co-morbid illnesses, this patient is at least at moderate risk for complications without adequate follow up.  This format is felt to be most appropriate for this patient at this time.  The patient did not have access to video technology/had technical difficulties with video requiring transitioning to audio format only (telephone).  All issues noted in this document were discussed and addressed.  No physical exam could be performed with this format.  Please refer to the patient's chart for his  consent to telehealth for [facility]. ? ?Evaluation Performed:  Preoperative cardiovascular risk assessment ?_____________  ? ?Date:  03/03/2022  ? ?Patient ID:  Lance, Pham 71/28/1952, MRN [id number] ?Patient Location:  ?Home ?Provider location:   ?Office ? ?Primary Care Provider:  Bonnita Nasuti, MD ?Primary Cardiologist:  Shirlee More, MD ? ?Chief Complaint  ?  ?71 y.o. y/o male with a h/o atrial fibrillation, orthostatic hypotension, autonomic dysfunction, HLD, CAD, anemia, multiple myeloma, RBBB, LAFB, stroke amongst multiple other comorbidities outlined below who is pending intramedullary nail of the right humerus, and presents today for telephonic preoperative cardiovascular risk assessment. ? ?Past Medical History  ?  ?Past Medical History:  ?Diagnosis Date  ? A-fib ([place])   ? hx of  ? Abnormal myocardial perfusion study 07/14/2018  ? Acute intermittent porphyria ([place]) 12/04/2020  ? Acute upper respiratory infection 12/04/2020  ? Allergic rhinitis 12/04/2020  ? Arthritis   ? RIGHT knee  ? BBB (bundle branch block)   ? hx of R BBB  ? Benign prostatic hyperplasia with lower urinary tract symptoms 12/04/2020  ? Binge eating disorder  12/04/2020  ? CAD in native artery 06/22/2019  ? Cataract   ? bilateral sx  ? Chest discomfort 07/14/2018  ? Chill 12/04/2020  ? Clotting disorder ([place])   ? has a genetic clotting dx -unknown name  ? Constipation 12/04/2020  ? 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[place]) 12/04/2020  ? Cutaneous abscess 12/04/2020  ? Dyslipidemia 07/14/2018  ? Episodic lightheadedness 10/14/2019  ? Essential hypertension 07/14/2018  ? Fever 12/04/2020  ? Gastro-esophageal reflux disease without esophagitis 12/04/2020  ? Generalized anxiety disorder 12/04/2020  ? Hyperglycemia due to type 2 diabetes mellitus ([place]) 12/04/2020  ? 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 Hypertension   ? on meds  ? Hypothyroidism 12/04/2020  ? Iron deficiency 12/04/2020  ? 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 Mixed hyperlipidemia 12/04/2020  ? Moderate recurrent major depression ([place]) 12/04/2020  ? Multiple myeloma ([place])   ? Myopathy 12/04/2020  ? Orthostatic hypotension   ? Other long term (current) drug therapy 12/04/2020  ? Other vitamin B12 deficiency anemias 12/04/2020  ? Overactive bladder 12/04/2020  ? Palpitations 10/14/2019  ? Paroxysmal atrial fibrillation ([place]) 06/22/2019  ? Peripheral vascular disease ([place]) 12/04/2020  ? 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 Formatting of this note might be different from the original. 3 mm pituitary microadenoma. Likely nonfunctional.  ? PONV (postoperative nausea and vomiting)   ? RBBB (right bundle branch block) 10/14/2019  ? Recurrent major depression in full remission ([place]) 12/04/2020  ? Shortness of breath 06/08/2019  ? Sleep apnea   ? use CPAP  ? Stroke (cerebrum) ([place]) 11/21/2020  ? 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 Vitamin  D deficiency 12/04/2020  ? Vomiting without nausea 12/04/2020  ? ?Past Surgical History:  ?Procedure Laterality Date  ? CATARACT EXTRACTION, BILATERAL    ? CHOLECYSTECTOMY    ? COLONOSCOPY  2017  ? RG-mag citrtae (good)-TICS/TA x 1  ?  FOOT SURGERY Left   ? tendon link  ? HAMMER TOE SURGERY    ? KNEE SURGERY Right   ? LAPAROSCOPIC GASTRIC BANDING    ? POLYPECTOMY  2017  ? TA x 1  ? WISDOM TOOTH EXTRACTION    ? WRIST RECONSTRUCTION Right   ? ? ?Allergies ? ?Allergies  ?Allergen Reactions  ? Nsaids Other (See Comments)  ?  Patient had a Lap band placed is suppose to have NO NSAIDS due to the possibility of esophageal erosion leading to band "coming through"  ? Tamiflu [Oseltamivir] Nausea Only and Other (See Comments)  ?  Stomach upset  ? ? ?History of Present Illness  ?  ?Lance Pham is a 71 y.o. male who presents via audio/video conferencing for a telehealth visit today. Prior history has been reviewed. Dr. Joya Gaskins note also outlines h/o LHC on 06/15/2019 that showed distal LAD disease that was no amenable to intervention, and nonobstructive disease otherwise. He was evaluated in the hospital last month for orthostatic hypotension and autonomic dysfunction in the setting of severe anemia and fracture of the humerus. He has since been diagnosed with multiple myeloma.  2D echo 02/10/22 showed EF 55-60%, no RWMA, mild asymmetric LVH, g2Dd, normal RV function, borderline dilation of the ascending aorta, no significant change from prior. Pt was last seen in cardiology clinic on 02/25/22 by Dr. Bettina Gavia at which time he was sent to the hospital for hypotension. Dr. Bettina Gavia also increased his midodrine to 90m TID. The patient states he was admitted for 3 days and received 3 units of blood and is feeling much better. His BP is 144/58 today. He is not having any regular chest pain or SOB. (Sometimes will get SOB if having a panic attack otherwise feels he is at his baseline.) The patient is now pending surgery on the humerus as above. Of note, he was unaware the surgery was scheduled for 5/4. ? ? ?Home Medications  ?  ?Prior to Admission medications   ?Medication Sig Start Date End Date Taking? Authorizing Provider  ?acetaminophen (TYLENOL) 500 MG tablet  Take 1,000 mg by mouth every 6 (six) hours as needed for mild pain or headache.    [provider]  ?albuterol (VENTOLIN HFA) 108 (90 Base) MCG/ACT inhaler Inhale 1 puff into the lungs every 4 (four) hours as needed for wheezing or shortness of breath.    [provider]  ?apixaban (ELIQUIS) 5 MG TABS tablet Take 5 mg by mouth 2 (two) times daily.    [provider]  ?benzonatate (TESSALON) 200 MG capsule Take 200 mg by mouth 3 (three) times daily as needed for cough. 01/27/22   [provider]  ?buPROPion (WELLBUTRIN SR) 150 MG 12 hr tablet Take 150 mg by mouth daily. 10/12/21   [provider]  ?cyanocobalamin (,VITAMIN B-12,) 1000 MCG/ML injection Inject 1 mL (1,000 mcg total) into the muscle every 30 (thirty) days. 02/12/22   ZWayland Denis MD  ?ferrous sulfate 324 (65 Fe) MG TBEC Take 324 mg by mouth daily after supper.    [provider]  ?FLUoxetine (PROZAC) 40 MG capsule Take 40 mg by mouth at bedtime. 01/16/20   [provider]  ?folic acid (FOLVITE) 4709MCG  tablet Take 400 mcg by mouth at bedtime.    [provider]  ?midodrine (PROAMATINE) 5 MG tablet Take 1 tablet (5 mg total) by mouth 3 (three) times daily. Pt states he is taking 2 11m tablets 3x/daily per Dr. MJoya Gaskinsinstruction 02/12/22 03/14/22  ZWayland Denis MD  ?polyethylene glycol (MIRALAX / GLYCOLAX) 17 g packet Take 17 g by mouth daily as needed for mild constipation or moderate constipation. 02/12/22   ZWayland Denis MD  ?pyridostigmine (MESTINON) 60 MG tablet Take 0.5 tablets (30 mg total) by mouth 2 (two) times daily. 02/12/22 03/14/22  ZWayland Denis MD  ?senna-docusate (SENOKOT-S) 8.6-50 MG tablet Take 2 tablets by mouth at bedtime as needed for mild constipation or moderate constipation. 02/12/22   ZWayland Denis MD  ? ? ?Physical Exam  ?  ?Vital Signs:  BP (!) 144/58   Pulse 71   SpO2 96%  ? ?Given telephonic nature of communication, physical exam is limited. ?AAOx3. NAD.  Normal affect.  Speech and respirations are unlabored. ? ?Accessory Clinical Findings  ?  ?None ? ?Assessment & Plan  ?  ?1.  Preoperative Cardiovascular Risk Assessment: RCRI 6.6% indicating moderate CV risk.

## 2022-03-03 NOTE — Telephone Encounter (Signed)
I s/w the pt today and his wife. Pt states he has not heard anything from Dr. Randel Pigg office about the surgery, pt is very confused. He does state he has a break in his arm. After speaking with the pt he is agreeable to plan of care for tele pre op appt today at 1 pm ok per Melina Copa, PAC. Med rec and consent are done. Pt said his wife is calling Dr. Randel Pigg office to ask why did they not tell him yet of the surgery set for May 4th, 2023.  ? ?  ?Patient Consent for Virtual Visit  ? ? ?   ? ?Lance Pham has provided verbal consent on 03/03/2022 for a virtual visit (video or telephone). ? ? ?CONSENT FOR VIRTUAL VISIT FOR:  Lance Pham  ?By participating in this virtual visit I agree to the following: ? ?I hereby voluntarily request, consent and authorize [facility] and its employed or contracted physicians, physician assistants, nurse practitioners or other licensed health care professionals (the Practitioner), to provide me with telemedicine health care services (the ?Services") as deemed necessary by the treating Practitioner. I acknowledge and consent to receive the Services by the Practitioner via telemedicine. I understand that the telemedicine visit will involve communicating with the Practitioner through live audiovisual communication technology and the disclosure of certain medical information by electronic transmission. I acknowledge that I have been given the opportunity to request an in-person assessment or other available alternative prior to the telemedicine visit and am voluntarily participating in the telemedicine visit. ? ?I understand that I have the right to withhold or withdraw my consent to the use of telemedicine in the course of my care at any time, without affecting my right to future care or treatment, and that the Practitioner or I may terminate the telemedicine visit at any time. I understand that I have the right to inspect all information obtained and/or recorded in the course of  the telemedicine visit and may receive copies of available information for a reasonable fee.  I understand that some of the potential risks of receiving the Services via telemedicine include:  ?Delay or interruption in medical evaluation due to technological equipment failure or disruption; ?Information transmitted may not be sufficient (e.g. poor resolution of images) to allow for appropriate medical decision making by the Practitioner; and/or  ?In rare instances, security protocols could fail, causing a breach of personal health information. ? ?Furthermore, I acknowledge that it is my responsibility to provide information about my medical history, conditions and care that is complete and accurate to the best of my ability. I acknowledge that Practitioner's advice, recommendations, and/or decision may be based on factors not within their control, such as incomplete or inaccurate data provided by me or distortions of diagnostic images or specimens that may result from electronic transmissions. I understand that the practice of medicine is not an exact science and that Practitioner makes no warranties or guarantees regarding treatment outcomes. I acknowledge that a copy of this consent can be made available to me via my patient portal ([facility]), or I can request a printed copy by calling the office of [facility].   ? ?I understand that my insurance will be billed for this visit.  ? ?I have read or had this consent read to me. ?I understand the contents of this consent, which adequately explains the benefits and risks of the Services being provided via telemedicine.  ?I have been provided ample opportunity to ask questions  regarding this consent and the Services and have had my questions answered to my satisfaction. ?I give my informed consent for the services to be provided through the use of telemedicine in my medical care ? ? ? ?

## 2022-03-03 NOTE — Telephone Encounter (Addendum)
Per d/w Dr. Bettina Gavia, needs virtual visit so we can review if BP improved. Per MD, if BP stable/improved, should be OK to clear but if not, will need follow-up in office visit. Will route to callback to help get on schedule ASAP. Will leave in inbox to follow today given time sensitive nature of clearance needed. ?

## 2022-03-03 NOTE — Telephone Encounter (Signed)
Covering preop today. Patient recently seen by Dr. Bettina Gavia 02/25/22 and recommended to go to ER due to hypotension, may have been [facility] as I do not have further records. Will route to MD for input on clearance since procedure is only 3 days away. ?

## 2022-03-03 NOTE — Telephone Encounter (Signed)
I s/w the pt today and his wife. Pt states he has not heard anything from Dr. Randel Pigg office about the surgery, pt is very confused. He does state he has a break in his arm. After speaking with the pt he is agreeable to plan of care for tele pre op appt today at 1 pm ok per Melina Copa, PAC. Med rec and consent are done. Pt said his wife is calling Dr. Randel Pigg office to ask why did they not tell him yet of the surgery set for May 4th, 2023.  ? ?  ?Patient Consent for Virtual Visit  ? ? ?   ? ?Lance Pham has provided verbal consent on 03/03/2022 for a virtual visit (video or telephone). ? ? ?CONSENT FOR VIRTUAL VISIT FOR:  [place]  ?By participating in this virtual visit I agree to the following: ? ?I hereby voluntarily request, consent and authorize [facility] and its employed or contracted physicians, physician assistants, nurse practitioners or other licensed health care professionals (the Practitioner), to provide me with telemedicine health care services (the ?Services") as deemed necessary by the treating Practitioner. I acknowledge and consent to receive the Services by the Practitioner via telemedicine. I understand that the telemedicine visit will involve communicating with the Practitioner through live audiovisual communication technology and the disclosure of certain medical information by electronic transmission. I acknowledge that I have been given the opportunity to request an in-person assessment or other available alternative prior to the telemedicine visit and am voluntarily participating in the telemedicine visit. ? ?I understand that I have the right to withhold or withdraw my consent to the use of telemedicine in the course of my care at any time, without affecting my right to future care or treatment, and that the Practitioner or I may terminate the telemedicine visit at any time. I understand that I have the right to inspect all information obtained and/or recorded in the course of  the telemedicine visit and may receive copies of available information for a reasonable fee.  I understand that some of the potential risks of receiving the Services via telemedicine include:  ?Delay or interruption in medical evaluation due to technological equipment failure or disruption; ?Information transmitted may not be sufficient (e.g. poor resolution of images) to allow for appropriate medical decision making by the Practitioner; and/or  ?In rare instances, security protocols could fail, causing a breach of personal health information. ? ?Furthermore, I acknowledge that it is my responsibility to provide information about my medical history, conditions and care that is complete and accurate to the best of my ability. I acknowledge that Practitioner's advice, recommendations, and/or decision may be based on factors not within their control, such as incomplete or inaccurate data provided by me or distortions of diagnostic images or specimens that may result from electronic transmissions. I understand that the practice of medicine is not an exact science and that Practitioner makes no warranties or guarantees regarding treatment outcomes. I acknowledge that a copy of this consent can be made available to me via my patient portal ([facility]), or I can request a printed copy by calling the office of [facility].   ? ?I understand that my insurance will be billed for this visit.  ? ?I have read or had this consent read to me. ?I understand the contents of this consent, which adequately explains the benefits and risks of the Services being provided via telemedicine.  ?I have been provided ample opportunity to ask questions  regarding this consent and the Services and have had my questions answered to my satisfaction. ?I give my informed consent for the services to be provided through the use of telemedicine in my medical care ? ? ? ? ? ?

## 2022-03-05 ENCOUNTER — Other Ambulatory Visit: Payer: Self-pay | Admitting: Oncology

## 2022-03-05 ENCOUNTER — Encounter (HOSPITAL_COMMUNITY): Payer: Self-pay | Admitting: Orthopedic Surgery

## 2022-03-05 ENCOUNTER — Inpatient Hospital Stay: Payer: PPO

## 2022-03-05 ENCOUNTER — Other Ambulatory Visit: Payer: Self-pay | Admitting: Hematology and Oncology

## 2022-03-05 ENCOUNTER — Other Ambulatory Visit: Payer: Self-pay

## 2022-03-05 ENCOUNTER — Encounter: Payer: Self-pay | Admitting: Oncology

## 2022-03-05 ENCOUNTER — Inpatient Hospital Stay: Payer: PPO | Attending: Oncology | Admitting: Oncology

## 2022-03-05 VITALS — BP 129/62 | HR 63 | Temp 98.1°F | Resp 20

## 2022-03-05 DIAGNOSIS — R339 Retention of urine, unspecified: Secondary | ICD-10-CM | POA: Insufficient documentation

## 2022-03-05 DIAGNOSIS — C9000 Multiple myeloma not having achieved remission: Secondary | ICD-10-CM | POA: Diagnosis present

## 2022-03-05 DIAGNOSIS — R351 Nocturia: Secondary | ICD-10-CM | POA: Diagnosis not present

## 2022-03-05 DIAGNOSIS — R3911 Hesitancy of micturition: Secondary | ICD-10-CM | POA: Diagnosis not present

## 2022-03-05 DIAGNOSIS — K5900 Constipation, unspecified: Secondary | ICD-10-CM | POA: Insufficient documentation

## 2022-03-05 LAB — HEPATIC FUNCTION PANEL
ALT: 44 U/L — AB (ref 10–40)
AST: 125 — AB (ref 14–40)
Alkaline Phosphatase: 167 — AB (ref 25–125)
Bilirubin, Total: 0.7

## 2022-03-05 LAB — CBC
Absolute Lymphocytes: 9.53 — AB (ref 0.65–4.75)
MCV: 96 — AB (ref 81–94)
RBC: 2.83 — AB (ref 3.87–5.11)

## 2022-03-05 LAB — BASIC METABOLIC PANEL
BUN: 19 (ref 4–21)
CO2: 29 — AB (ref 13–22)
Chloride: 104 (ref 99–108)
Creatinine: 1.5 — AB (ref 0.6–1.3)
Glucose: 131
Potassium: 4.4 mEq/L (ref 3.5–5.1)
Sodium: 134 — AB (ref 137–147)

## 2022-03-05 LAB — CORRECTED CALCIUM (CC13): Calcium, Corrected: 11

## 2022-03-05 LAB — COMPREHENSIVE METABOLIC PANEL
Albumin: 3.4 — AB (ref 3.5–5.0)
Calcium: 10.4 (ref 8.7–10.7)

## 2022-03-05 LAB — SURGICAL PATHOLOGY

## 2022-03-05 LAB — CBC AND DIFFERENTIAL
HCT: 27 — AB (ref 41–53)
Hemoglobin: 8.7 — AB (ref 13.5–17.5)
Neutrophils Absolute: 2.67
Platelets: 147 10*3/uL — AB (ref 150–400)
WBC: 12.7

## 2022-03-05 LAB — PROTEIN, TOTAL: Total Protein: 12.7 g/dL — AB (ref 6.3–8.2)

## 2022-03-05 LAB — SAMPLE TO BLOOD BANK

## 2022-03-05 NOTE — Anesthesia Preprocedure Evaluation (Addendum)
Anesthesia Evaluation  ?Patient identified by MRN, date of birth, ID band ?Patient awake ? ? ? ?Reviewed: ?Allergy & Precautions, NPO status , Patient's Chart, lab work & pertinent test results ? ?History of Anesthesia Complications ?(+) PONV and history of anesthetic complications ? ?Airway ?Mallampati: III ? ?TM Distance: >3 FB ?Neck ROM: Full ? ? ? Dental ?no notable dental hx. ? ?  ?Pulmonary ?sleep apnea and Continuous Positive Airway Pressure Ventilation ,  ?  ?Pulmonary exam normal ?breath sounds clear to auscultation ? ? ? ? ? ? Cardiovascular ?hypertension, + CAD and + Peripheral Vascular Disease  ?+ dysrhythmias (RBBB; left anterior fascicular block; on Eliquis) Atrial Fibrillation  ?Rhythm:Regular Rate:Normal ? ?Echo 02/25/22 [facility]; scanned under Media tab): ?Conclusions: ?1.  Technically difficult study. ?2.  Overall left ventricular systolic function is normal with an EF between 55 and 60%. ?3.  The diastolic filling pattern indicates impaired relaxation. ?4.  Left atrium is severely dilated by volume. ?5.  There is trace mitral regurgitation. ?6.  Trace tricuspid regurgitation present. ?7.  PFO noted on color Doppler but intravenous injection of agitated saline did not confirm this finding.  If clinically indicated consider TEE. ? ?  ?Neuro/Psych ?PSYCHIATRIC DISORDERS Anxiety Depression CVA   ? GI/Hepatic ?GERD  ,  ?Endo/Other  ?diabetes, Type 2Hypothyroidism Primary aldosteronism ?Dyslipidemia ?hyperprolactinemia ? Renal/GU ?  ? ?  ?Musculoskeletal ? ?(+) Arthritis ,  ? Abdominal ?  ?Peds ? Hematology ? ?(+) Blood dyscrasia, anemia , Multiple myeloma; leukemia   ?Anesthesia Other Findings ? ? Reproductive/Obstetrics ? ?  ? ? ? ? ? ? ? ? ? ? ? ? ? ?  ?  ? ? ? ? ?Anesthesia Physical ?Anesthesia Plan ? ?ASA: 3 ? ?Anesthesia Plan: General  ? ?Post-op Pain Management: Tylenol PO (pre-op)*  ? ?Induction: Intravenous ? ?PONV Risk Score and Plan: 3 and Treatment  may vary due to age or medical condition, Ondansetron, Dexamethasone and Midazolam ? ?Airway Management Planned: Oral ETT ? ?Additional Equipment: None ? ?Intra-op Plan:  ? ?Post-operative Plan: Extubation in OR ? ?Informed Consent: I have reviewed the patients History and Physical, chart, labs and discussed the procedure including the risks, benefits and alternatives for the proposed anesthesia with the patient or authorized representative who has indicated his/her understanding and acceptance.  ? ? ? ?Dental advisory given ? ?Plan Discussed with: CRNA, Anesthesiologist and Surgeon ? ?Anesthesia Plan Comments: (Patient with recent issues with orthostatic hypotension, now resolved. Also has history of acute intermittent porphyria (will avoid barbiturates/etomidate, ketorolac, sulphonamides). Cephalosporins are ok. Patient denies a history of AIP. ECHO from 02/25/22 states possible PFO, not no passage of agitated saline. Two prior ECHOs show no PFO. Norton Blizzard, MD  ? ? ? ?)  ? ? ? ?Anesthesia Quick Evaluation ? ?

## 2022-03-05 NOTE — Progress Notes (Signed)
PCP - Dr. Jannette Fogo, I. ? ?Cardiologist - Dr. Bettina Gavia ? ?EP- Denies ? ?Endocrine- Denies ? ?Pulm- Denies ? ?Chest x-ray - Denies ? ?EKG - 02/11/22 (E) ? ?Stress Test - Denies ? ?ECHO - 03/05/22 (E) ? ?Cardiac Cath - Denies ? ?AICD-na ?PM-na ?LOOP-na ? ?Nerve Stimulator- Denies ? ?Dialysis- Denies ? ?Sleep Study - Yes- Positive ?CPAP - Yes ? ?LABS- 03/05/22(E): CBC w/D, CMP ? ?ASA- Denies ?ELIQUIS- LD- 5/1 ? ?ERAS- Yes, clears until 1300 ? ?HA1C- Denies ? ?Anesthesia- Yes- cardiac/medical history ? ?Pt denies having chest pain, sob, or fever during the pre-op phone call. All instructions explained to the pt, with a verbal understanding of the material including: as of today,  stop taking all Aspirin (unless instructed by your doctor) and Other Aspirin containing products, Vitamins, Fish oils, and Herbal medications. Also stop all NSAIDS i.e. Advil, Ibuprofen, Motrin, Aleve, Anaprox, Naproxen, BC, Goody Powders, and all Supplements.  Pt also instructed to wear a mask and social distance if he goes out. The opportunity to ask questions was provided.  ?

## 2022-03-05 NOTE — Progress Notes (Signed)
Anesthesia Chart Review: SAME DAY WORK-UP ? Case: [id number] Date/Time: 03/06/22 1553  ? Procedure: INTRAMEDULLARY (IM) NAIL HUMERAL (Right)  ? Anesthesia type: General  ? Pre-op diagnosis: right humeral shaft fracture  ? Location: [facility] ROOM 07 / [facility]  ? Surgeons: Meredith Pel, MD  ? ?  ? ? ?DISCUSSION: Patient is a 71 year old male scheduled for the above procedure. ? 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  ? ?- [facility] admission 02/09/22-02/12/22 for presyncope in setting worsening anemia (HGB 7.2) and known dysautomonia with resulting fall with right humerus fracture. Negative head CT. + Orthostatic hypotension.  Abdominal binder provided and medications adjusted  (Florinef discontinued due to previously normal cortisol; midodrine decreased and Mestinon added). Cardiology consulted for bifascicular block on EKG. Echo stable without significant structural heart disease. He was give 1 unit PRBC for anemia. Xrays on admission concerning for lytic lesion predisposing humerus fracture, but no definite additional lytic lesions noted on bone scan. Work-up for multiple myeloma as source of macrocytic anemia initiated with outpatient follow-up planned.  Orthopedics to follow right humeral fracture outpatient.  ? ? ?Telephonic preoperative cardiovascular risk assessment on 03/03/2022 by Melina Copa, PA-C. She wrote, "Preoperative Cardiovascular Risk  Assessment: RCRI 6.6% indicating moderate CV risk. I reviewed case with Dr. Bettina Gavia prior to reaching out to patient. Dr. Bettina Gavia felt that if BP was stable and symptoms had improved in tele visit today that patient could proceed without further cardiac testing. The patient affirms he has been doing well without any new cardiac symptoms and BP improved to 846K systolic therefore he is acceptable risk for the planned procedure without further cardiovascular testing. The patient was advised that if he develops new symptoms prior to surgery to contact our office to arrange for a follow-up visit, and he verbalized understanding. ?  ?Regarding anticoagulation, Per office protocol, patient can hold Eliquis for 2 days prior to procedure.  He is high risk off anticoagulation due to hx of stroke. ACC/AHA does not recommend bridging. He will need to resume Eliquis as soon as safely possible. The patient states he was unaware that surgery was actually definitively scheduled for 5/4 so I asked him to call his surgeon after we hang up to verify final instructions. He is aware of the recommendation to hold Eliquis as discussed 2 days prior to surgery. ?  ?The patient did verify he is taking 2 69m tablets of midodrine TID. I offered to send up updated rx under Dr. MBettina Gaviabut he states he has 178mtablet rx at home and does not need one at this time." ? ?- He is seeing oncologist Dr. McHinton Raon 03/05/22 at 1:30 PM. Note is pending. Labs also ordered including CBC and CMP but are pending.  ? ?In posting, anesthesia is posted as "GENERAL (NO BLOCK)".  Anesthesia team to evaluate on the day of surgery. Hopefully will have oncology note and lab results available by then. ? ?VS:  ?BP Readings from Last 3 Encounters:  ?03/03/22 (!) 144/58  ?02/25/22 90/60  ?02/12/22 138/68  ? ?Pulse Readings from Last 3  Encounters:  ?03/03/22 71  ?02/25/22 74  ?02/12/22 60  ?  ? ? ?PROVIDERS: ?[place], Rosalyn Charters, MD is PCP  ?Antony Contras, MD is  neurologist ?Shirlee More, MD is cardiologist ?Hosie Poisson, MD is HEM-ONC ? ? ?LABS: See DISCUSSION. ? ? ?IMAGES: ?Xray right humerus 02/28/22: ?AP and lateral views of right humerus reviewed.  Midshaft humerus fracture  ?again noted in good alignment with very little angulation.  Fracture seems  ?slightly more distracted than previous radiographs.  No callus formation. ? ?1V PCXR 02/25/22 ([facility]/PACS): ?FINDINGS: ?The heart size and mediastinal contours are within normal limits. ?Both lungs are clear. The visualized skeletal structures are ?unremarkable. ?IMPRESSION: ?No active disease and no significant interval change. ? ?CT chest/abd/pelvis 02/11/22: ?IMPRESSION: ?- There is no significant lymphadenopathy in the mediastinum. There ?are subcentimeter nodular densities in the left upper lung fields. ?This finding may suggest scarring or granulomas or neoplastic ?process. Follow-up CT in 6 months may be considered to re-evaluate ?this finding. ?- Small patchy infiltrates are seen in the both lower lobes, more so ?on the left side suggesting atelectasis/pneumonia. Minimal bilateral ?pleural effusions, more so on the left side. ?- There is no evidence of intestinal obstruction or pneumoperitoneum. ?There is no hydronephrosis. Appendix is not dilated. ?- There is mild diffuse wall thickening in the ascending colon and ?hepatic flexure. This may be related to chronic nonspecific colitis. ?Part of this finding may be due to incomplete distention. ?- There is perinephric stranding around the kidneys, more so on the ?right side. This may be residual from previous obstruction or ?chronic inflammation. There is no loculated perinephric fluid ?collection. ?- There is 14 mm nodule in the left adrenal which may suggest ?incidental benign adenoma or some other neoplastic process. This ?finding has not changed significantly in comparison with the CT done ?on 06/20/2018. ?- Small hiatal hernia.  Diverticulosis of colon.   Enlarged prostate. ?- Other findings as described in the body of the report. [See full report] ? ?Bone Survey Scan 02/10/22: ?IMPRESSION: ?1. Mildly comminuted fracture through the mid shaft of the right ?humerus. The underlying bone appears somewhat irregular raising ?concern for an ill-defined permeative lesion. ?2. No definite additional lytic or blastic osseous lesions. ?3. Bilateral knee joint osteoarthritis. ? ?CTA neck 11/20/20 [facility]; seen [facility]/PACS) showed mild atherosclerotic change about the carotid bifurcations and carotid siphons without hemodynamically significant stenosis. ? ? ?EKG: 02/09/22 11:11:30: ?Normal sinus rhythm ?RBBB and LAFB ?Left ventricular hypertrophy ?No significant change since last tracing ?Confirmed by Gareth Morgan [id number]) on 02/09/2022 11:19:00 AM ? ? ?CV: ?Echo 02/25/22 [facility]; scanned under Media tab): ?Conclusions: ?1.  Technically difficult study. ?2.  Overall left ventricular systolic function is normal with an EF between 55 and 60%. ?3.  The diastolic filling pattern indicates impaired relaxation. ?4.  Left atrium is severely dilated by volume. ?5.  There is trace mitral regurgitation. ?6.  Trace tricuspid regurgitation present. ?7.  PFO noted on color Doppler but intravenous injection of agitated saline did not confirm this finding.  If clinically indicated consider TEE. ? ? ?Cardiac cath 06/22/19 ([facility]): ?- LMCA: Normal appearance with 0% stenosis.  ?- LAD: The LAD is a large artery that wrap around the apex It is free of disease  ?all the way down to the apex After wrapping around the apex, there is a  ?significant stenosis. The vessel at this level is probably only 1 mm in  ?diameter. Very unlikely will produce any symptoms It is a very  small erritory  ?involved.  ?- LCx: Normal appearance with 0% stenosis.  ?- RCA: There is a 20-30% stenosis proximal Otherwise it is free of significant  ?disease  ? ?FINDINGS:  ?Mild CAD  ?Severe stenosis in the  very distal LAD after the apex (very small vessel at  ?this level)  ?30% proximal RCA  ?Diagnostic Procedure Recommendations  ?Medical management  ?COMMENT:  ?While doing the procedure, patient developed significant jaw p

## 2022-03-06 ENCOUNTER — Encounter (HOSPITAL_COMMUNITY): Payer: Self-pay | Admitting: Orthopedic Surgery

## 2022-03-06 ENCOUNTER — Other Ambulatory Visit: Payer: PPO

## 2022-03-06 ENCOUNTER — Other Ambulatory Visit: Payer: Self-pay | Admitting: Hematology and Oncology

## 2022-03-06 ENCOUNTER — Encounter (HOSPITAL_COMMUNITY)
Admission: RE | Disposition: A | Discharge status: Home / Self Care | Payer: Self-pay | Source: Home / Self Care | Attending: Orthopedic Surgery

## 2022-03-06 ENCOUNTER — Other Ambulatory Visit: Payer: Self-pay

## 2022-03-06 ENCOUNTER — Encounter: Payer: PPO | Admitting: Hematology and Oncology

## 2022-03-06 ENCOUNTER — Ambulatory Visit (HOSPITAL_COMMUNITY): Payer: PPO | Admitting: Vascular Surgery

## 2022-03-06 ENCOUNTER — Ambulatory Visit (HOSPITAL_COMMUNITY): Payer: PPO

## 2022-03-06 ENCOUNTER — Observation Stay (HOSPITAL_COMMUNITY)
Admission: RE | Admit: 2022-03-06 | Discharge: 2022-03-07 | Disposition: A | Discharge status: Home / Self Care | Payer: PPO | Attending: Orthopedic Surgery | Admitting: Orthopedic Surgery

## 2022-03-06 ENCOUNTER — Observation Stay (HOSPITAL_COMMUNITY): Payer: PPO

## 2022-03-06 ENCOUNTER — Ambulatory Visit (HOSPITAL_BASED_OUTPATIENT_CLINIC_OR_DEPARTMENT_OTHER): Payer: PPO | Admitting: Vascular Surgery

## 2022-03-06 DIAGNOSIS — M84421K Pathological fracture, right humerus, subsequent encounter for fracture with nonunion: Principal | ICD-10-CM | POA: Insufficient documentation

## 2022-03-06 DIAGNOSIS — E039 Hypothyroidism, unspecified: Secondary | ICD-10-CM | POA: Insufficient documentation

## 2022-03-06 DIAGNOSIS — C9000 Multiple myeloma not having achieved remission: Secondary | ICD-10-CM | POA: Diagnosis present

## 2022-03-06 DIAGNOSIS — S42351A Displaced comminuted fracture of shaft of humerus, right arm, initial encounter for closed fracture: Secondary | ICD-10-CM | POA: Diagnosis not present

## 2022-03-06 DIAGNOSIS — E1122 Type 2 diabetes mellitus with diabetic chronic kidney disease: Secondary | ICD-10-CM | POA: Insufficient documentation

## 2022-03-06 DIAGNOSIS — I951 Orthostatic hypotension: Secondary | ICD-10-CM | POA: Diagnosis present

## 2022-03-06 DIAGNOSIS — Z79899 Other long term (current) drug therapy: Secondary | ICD-10-CM | POA: Diagnosis not present

## 2022-03-06 DIAGNOSIS — E1151 Type 2 diabetes mellitus with diabetic peripheral angiopathy without gangrene: Secondary | ICD-10-CM

## 2022-03-06 DIAGNOSIS — I129 Hypertensive chronic kidney disease with stage 1 through stage 4 chronic kidney disease, or unspecified chronic kidney disease: Secondary | ICD-10-CM | POA: Insufficient documentation

## 2022-03-06 DIAGNOSIS — Z8673 Personal history of transient ischemic attack (TIA), and cerebral infarction without residual deficits: Secondary | ICD-10-CM | POA: Diagnosis not present

## 2022-03-06 DIAGNOSIS — I2510 Atherosclerotic heart disease of native coronary artery without angina pectoris: Secondary | ICD-10-CM | POA: Insufficient documentation

## 2022-03-06 DIAGNOSIS — N1831 Chronic kidney disease, stage 3a: Secondary | ICD-10-CM | POA: Diagnosis present

## 2022-03-06 DIAGNOSIS — Z4789 Encounter for other orthopedic aftercare: Secondary | ICD-10-CM | POA: Diagnosis not present

## 2022-03-06 DIAGNOSIS — S42309A Unspecified fracture of shaft of humerus, unspecified arm, initial encounter for closed fracture: Secondary | ICD-10-CM | POA: Diagnosis present

## 2022-03-06 DIAGNOSIS — E871 Hypo-osmolality and hyponatremia: Secondary | ICD-10-CM | POA: Diagnosis present

## 2022-03-06 DIAGNOSIS — Z01818 Encounter for other preprocedural examination: Secondary | ICD-10-CM

## 2022-03-06 DIAGNOSIS — I10 Essential (primary) hypertension: Secondary | ICD-10-CM

## 2022-03-06 DIAGNOSIS — X58XXXD Exposure to other specified factors, subsequent encounter: Secondary | ICD-10-CM | POA: Diagnosis not present

## 2022-03-06 DIAGNOSIS — I480 Paroxysmal atrial fibrillation: Secondary | ICD-10-CM | POA: Diagnosis present

## 2022-03-06 DIAGNOSIS — Z7901 Long term (current) use of anticoagulants: Secondary | ICD-10-CM | POA: Insufficient documentation

## 2022-03-06 HISTORY — PX: HUMERUS IM NAIL: SHX1769

## 2022-03-06 LAB — PREPARE RBC (CROSSMATCH)

## 2022-03-06 LAB — CBC
HCT: 26.3 % — ABNORMAL LOW (ref 39.0–52.0)
Hemoglobin: 8.4 g/dL — ABNORMAL LOW (ref 13.0–17.0)
MCH: 31.3 pg (ref 26.0–34.0)
MCHC: 31.9 g/dL (ref 30.0–36.0)
MCV: 98.1 fL (ref 80.0–100.0)
Platelets: 148 10*3/uL — ABNORMAL LOW (ref 150–400)
RBC: 2.68 MIL/uL — ABNORMAL LOW (ref 4.22–5.81)
RDW: 21.9 % — ABNORMAL HIGH (ref 11.5–15.5)
WBC: 16 10*3/uL — ABNORMAL HIGH (ref 4.0–10.5)
nRBC: 1 % — ABNORMAL HIGH (ref 0.0–0.2)

## 2022-03-06 LAB — BASIC METABOLIC PANEL
Anion gap: 4 — ABNORMAL LOW (ref 5–15)
BUN: 19 mg/dL (ref 8–23)
CO2: 24 mmol/L (ref 22–32)
Calcium: 8.8 mg/dL — ABNORMAL LOW (ref 8.9–10.3)
Chloride: 101 mmol/L (ref 98–111)
Creatinine, Ser: 1.48 mg/dL — ABNORMAL HIGH (ref 0.61–1.24)
GFR, Estimated: 51 mL/min — ABNORMAL LOW (ref >60)
Glucose, Bld: 137 mg/dL — ABNORMAL HIGH (ref 70–99)
Potassium: 5 mmol/L (ref 3.5–5.1)
Sodium: 129 mmol/L — ABNORMAL LOW (ref 135–145)

## 2022-03-06 LAB — GLUCOSE, CAPILLARY: Glucose-Capillary: 121 mg/dL — ABNORMAL HIGH (ref 70–99)

## 2022-03-06 LAB — BETA 2 MICROGLOBULIN, SERUM: Beta-2 Microglobulin: 12.3 mg/L — ABNORMAL HIGH (ref 0.6–2.4)

## 2022-03-06 LAB — PRETREATMENT RBC PHENOTYPE

## 2022-03-06 SURGERY — INSERTION, INTRAMEDULLARY ROD, HUMERUS
Anesthesia: General | Site: Arm Upper | Laterality: Right

## 2022-03-06 MED ORDER — DEXAMETHASONE SODIUM PHOSPHATE 10 MG/ML IJ SOLN
INTRAMUSCULAR | Status: DC | PRN
Start: 2022-03-06 — End: 2022-03-06
  Administered 2022-03-06: 10 mg via INTRAVENOUS

## 2022-03-06 MED ORDER — ONDANSETRON HCL 4 MG/2ML IJ SOLN: 4.0000 mg | Freq: Once | INTRAMUSCULAR | Status: DC | PRN

## 2022-03-06 MED ORDER — ACETAMINOPHEN 500 MG PO TABS
1000.0000 mg | ORAL_TABLET | Freq: Four times a day (QID) | ORAL | Status: DC
  Administered 2022-03-06 – 2022-03-07 (×2): 1000 mg via ORAL
  Filled 2022-03-06 (×2): qty 2

## 2022-03-06 MED ORDER — TRANEXAMIC ACID-NACL 1000-0.7 MG/100ML-% IV SOLN
1000.0000 mg | INTRAVENOUS | Status: AC
  Administered 2022-03-06: 1000 mg via INTRAVENOUS
  Filled 2022-03-06: qty 100

## 2022-03-06 MED ORDER — PYRIDOSTIGMINE BROMIDE 60 MG PO TABS
30.0000 mg | ORAL_TABLET | Freq: Two times a day (BID) | ORAL | Status: DC
  Administered 2022-03-06 – 2022-03-07 (×2): 30 mg via ORAL
  Filled 2022-03-06 (×4): qty 0.5

## 2022-03-06 MED ORDER — MORPHINE SULFATE (PF) 4 MG/ML IV SOLN
INTRAVENOUS | Status: DC | PRN
  Administered 2022-03-06: 33 mL

## 2022-03-06 MED ORDER — LACTATED RINGERS IV SOLN
INTRAVENOUS | Status: DC
Start: 2022-03-06 — End: 2022-03-07

## 2022-03-06 MED ORDER — METHOCARBAMOL 500 MG PO TABS: 500.0000 mg | ORAL_TABLET | Freq: Four times a day (QID) | ORAL | Status: DC | PRN

## 2022-03-06 MED ORDER — BUPROPION HCL ER (SR) 150 MG PO TB12
150.0000 mg | ORAL_TABLET | Freq: Every day | ORAL | Status: DC
  Filled 2022-03-06: qty 1

## 2022-03-06 MED ORDER — NEOSTIGMINE METHYLSULFATE 3 MG/3ML IV SOSY
PREFILLED_SYRINGE | INTRAVENOUS | Status: DC | PRN
  Administered 2022-03-06 (×3): 1 mg via INTRAVENOUS

## 2022-03-06 MED ORDER — HYDROMORPHONE HCL 1 MG/ML IJ SOLN
INTRAMUSCULAR | Status: AC
  Filled 2022-03-06: qty 0.5

## 2022-03-06 MED ORDER — HYDROMORPHONE HCL 1 MG/ML IJ SOLN
INTRAMUSCULAR | Status: DC | PRN
Start: 2022-03-06 — End: 2022-03-06
  Administered 2022-03-06 (×2): .25 mg via INTRAVENOUS

## 2022-03-06 MED ORDER — SUCCINYLCHOLINE CHLORIDE 200 MG/10ML IV SOSY
PREFILLED_SYRINGE | INTRAVENOUS | Status: AC
  Filled 2022-03-06: qty 10

## 2022-03-06 MED ORDER — BUPIVACAINE HCL (PF) 0.25 % IJ SOLN
INTRAMUSCULAR | Status: AC
  Filled 2022-03-06: qty 30

## 2022-03-06 MED ORDER — VANCOMYCIN HCL 1000 MG IV SOLR
INTRAVENOUS | Status: DC | PRN
  Administered 2022-03-06: 1000 mg via TOPICAL

## 2022-03-06 MED ORDER — METOCLOPRAMIDE HCL 5 MG PO TABS: 5.0000 mg | ORAL_TABLET | Freq: Three times a day (TID) | ORAL | Status: DC | PRN

## 2022-03-06 MED ORDER — ROCURONIUM BROMIDE 10 MG/ML (PF) SYRINGE
PREFILLED_SYRINGE | INTRAVENOUS | Status: AC
  Filled 2022-03-06: qty 10

## 2022-03-06 MED ORDER — DOCUSATE SODIUM 100 MG PO CAPS
100.0000 mg | ORAL_CAPSULE | Freq: Two times a day (BID) | ORAL | Status: DC
  Administered 2022-03-06 – 2022-03-07 (×2): 100 mg via ORAL
  Filled 2022-03-06 (×2): qty 1

## 2022-03-06 MED ORDER — METHOCARBAMOL 1000 MG/10ML IJ SOLN
500.0000 mg | Freq: Four times a day (QID) | INTRAVENOUS | Status: DC | PRN
  Filled 2022-03-06: qty 5

## 2022-03-06 MED ORDER — PHENOL 1.4 % MT LIQD: 1.0000 | OROMUCOSAL | Status: DC | PRN

## 2022-03-06 MED ORDER — ACETAMINOPHEN 500 MG PO TABS
ORAL_TABLET | ORAL | Status: AC
  Administered 2022-03-06: 1000 mg via ORAL
  Filled 2022-03-06: qty 2

## 2022-03-06 MED ORDER — CHLORHEXIDINE GLUCONATE 0.12 % MT SOLN: 15.0000 mL | OROMUCOSAL | Status: AC

## 2022-03-06 MED ORDER — MORPHINE SULFATE (PF) 4 MG/ML IV SOLN
INTRAVENOUS | Status: AC
  Filled 2022-03-06: qty 2

## 2022-03-06 MED ORDER — OXYCODONE HCL 5 MG PO TABS: 5.0000 mg | ORAL_TABLET | Freq: Once | ORAL | Status: DC | PRN

## 2022-03-06 MED ORDER — MIDODRINE HCL 5 MG PO TABS: 10.0000 mg | ORAL_TABLET | Freq: Three times a day (TID) | ORAL | Status: DC

## 2022-03-06 MED ORDER — HYDROMORPHONE HCL 1 MG/ML IJ SOLN
0.5000 mg | INTRAMUSCULAR | Status: DC | PRN
  Administered 2022-03-06 – 2022-03-07 (×2): 0.5 mg via INTRAVENOUS
  Filled 2022-03-06 (×2): qty 0.5

## 2022-03-06 MED ORDER — FENTANYL CITRATE (PF) 250 MCG/5ML IJ SOLN
INTRAMUSCULAR | Status: AC
  Filled 2022-03-06: qty 5

## 2022-03-06 MED ORDER — ONDANSETRON HCL 4 MG/2ML IJ SOLN
INTRAMUSCULAR | Status: AC
  Filled 2022-03-06: qty 2

## 2022-03-06 MED ORDER — ROCURONIUM BROMIDE 10 MG/ML (PF) SYRINGE
PREFILLED_SYRINGE | INTRAVENOUS | Status: DC | PRN
Start: 2022-03-06 — End: 2022-03-06
  Administered 2022-03-06 (×3): 20 mg via INTRAVENOUS
  Administered 2022-03-06: 80 mg via INTRAVENOUS

## 2022-03-06 MED ORDER — MENTHOL 3 MG MT LOZG: 1.0000 | LOZENGE | OROMUCOSAL | Status: DC | PRN

## 2022-03-06 MED ORDER — NEOSTIGMINE METHYLSULFATE 3 MG/3ML IV SOSY
PREFILLED_SYRINGE | INTRAVENOUS | Status: AC
  Filled 2022-03-06: qty 6

## 2022-03-06 MED ORDER — POVIDONE-IODINE 10 % EX SWAB: 2.0000 "application " | Freq: Once | CUTANEOUS | Status: DC

## 2022-03-06 MED ORDER — ONDANSETRON HCL 4 MG PO TABS
4.0000 mg | ORAL_TABLET | Freq: Four times a day (QID) | ORAL | Status: DC | PRN
Start: 2022-03-06 — End: 2022-03-07

## 2022-03-06 MED ORDER — LIDOCAINE 2% (20 MG/ML) 5 ML SYRINGE
INTRAMUSCULAR | Status: AC
  Filled 2022-03-06: qty 5

## 2022-03-06 MED ORDER — LIDOCAINE 2% (20 MG/ML) 5 ML SYRINGE
INTRAMUSCULAR | Status: DC | PRN
  Administered 2022-03-06: 80 mg via INTRAVENOUS

## 2022-03-06 MED ORDER — OXYCODONE HCL 5 MG/5ML PO SOLN: 5.0000 mg | Freq: Once | ORAL | Status: DC | PRN

## 2022-03-06 MED ORDER — ONDANSETRON HCL 4 MG/2ML IJ SOLN: 4.0000 mg | Freq: Four times a day (QID) | INTRAMUSCULAR | Status: DC | PRN

## 2022-03-06 MED ORDER — GLYCOPYRROLATE PF 0.2 MG/ML IJ SOSY
PREFILLED_SYRINGE | INTRAMUSCULAR | Status: DC | PRN
  Administered 2022-03-06 (×3): .2 mg via INTRAVENOUS

## 2022-03-06 MED ORDER — POVIDONE-IODINE 7.5 % EX SOLN: Freq: Once | CUTANEOUS | Status: DC

## 2022-03-06 MED ORDER — AMISULPRIDE (ANTIEMETIC) 5 MG/2ML IV SOLN: 10.0000 mg | Freq: Once | INTRAVENOUS | Status: DC | PRN

## 2022-03-06 MED ORDER — ONDANSETRON HCL 4 MG/2ML IJ SOLN
INTRAMUSCULAR | Status: DC | PRN
  Administered 2022-03-06: 4 mg via INTRAVENOUS

## 2022-03-06 MED ORDER — VANCOMYCIN HCL 1000 MG IV SOLR
INTRAVENOUS | Status: AC
  Filled 2022-03-06: qty 20

## 2022-03-06 MED ORDER — DEXAMETHASONE SODIUM PHOSPHATE 10 MG/ML IJ SOLN
INTRAMUSCULAR | Status: AC
  Filled 2022-03-06: qty 1

## 2022-03-06 MED ORDER — PHENYLEPHRINE HCL-NACL 20-0.9 MG/250ML-% IV SOLN
INTRAVENOUS | Status: DC | PRN
  Administered 2022-03-06: 50 ug/min via INTRAVENOUS

## 2022-03-06 MED ORDER — CEFAZOLIN SODIUM-DEXTROSE 2-4 GM/100ML-% IV SOLN
2.0000 g | Freq: Three times a day (TID) | INTRAVENOUS | Status: DC
  Administered 2022-03-07: 2 g via INTRAVENOUS
  Filled 2022-03-06: qty 100

## 2022-03-06 MED ORDER — CEFAZOLIN SODIUM-DEXTROSE 2-4 GM/100ML-% IV SOLN
2.0000 g | INTRAVENOUS | Status: AC
  Administered 2022-03-06: 2 g via INTRAVENOUS
  Filled 2022-03-06: qty 100

## 2022-03-06 MED ORDER — GLYCOPYRROLATE PF 0.2 MG/ML IJ SOSY
PREFILLED_SYRINGE | INTRAMUSCULAR | Status: AC
  Filled 2022-03-06: qty 3

## 2022-03-06 MED ORDER — MIDAZOLAM HCL 2 MG/2ML IJ SOLN
INTRAMUSCULAR | Status: AC
  Filled 2022-03-06: qty 2

## 2022-03-06 MED ORDER — TAMSULOSIN HCL 0.4 MG PO CAPS
0.4000 mg | ORAL_CAPSULE | Freq: Every day | ORAL | Status: DC
  Filled 2022-03-06: qty 1

## 2022-03-06 MED ORDER — PROPOFOL 10 MG/ML IV BOLUS
INTRAVENOUS | Status: DC | PRN
  Administered 2022-03-06: 150 mg via INTRAVENOUS
  Administered 2022-03-06: 30 mg via INTRAVENOUS

## 2022-03-06 MED ORDER — FENTANYL CITRATE (PF) 250 MCG/5ML IJ SOLN
INTRAMUSCULAR | Status: DC | PRN
  Administered 2022-03-06 (×4): 50 ug via INTRAVENOUS
  Administered 2022-03-06 (×2): 25 ug via INTRAVENOUS

## 2022-03-06 MED ORDER — ACETAMINOPHEN 325 MG PO TABS: 325.0000 mg | ORAL_TABLET | Freq: Four times a day (QID) | ORAL | Status: DC | PRN

## 2022-03-06 MED ORDER — OXYCODONE HCL 5 MG PO TABS: 5.0000 mg | ORAL_TABLET | ORAL | Status: DC | PRN

## 2022-03-06 MED ORDER — FENTANYL CITRATE (PF) 100 MCG/2ML IJ SOLN: 25.0000 ug | INTRAMUSCULAR | Status: DC | PRN

## 2022-03-06 MED ORDER — ACETAMINOPHEN 500 MG PO TABS: 1000.0000 mg | ORAL_TABLET | Freq: Once | ORAL | Status: AC

## 2022-03-06 MED ORDER — METOCLOPRAMIDE HCL 5 MG/ML IJ SOLN: 5.0000 mg | Freq: Three times a day (TID) | INTRAMUSCULAR | Status: DC | PRN

## 2022-03-06 MED ORDER — CLONIDINE HCL (ANALGESIA) 100 MCG/ML EP SOLN
EPIDURAL | Status: AC
  Filled 2022-03-06: qty 10

## 2022-03-06 MED ORDER — PROPOFOL 10 MG/ML IV BOLUS
INTRAVENOUS | Status: AC
  Filled 2022-03-06: qty 20

## 2022-03-06 MED ORDER — LACTATED RINGERS IV SOLN: INTRAVENOUS | Status: DC

## 2022-03-06 MED ORDER — MIDAZOLAM HCL 5 MG/5ML IJ SOLN
INTRAMUSCULAR | Status: DC | PRN
  Administered 2022-03-06: 1 mg via INTRAVENOUS

## 2022-03-06 MED ORDER — 0.9 % SODIUM CHLORIDE (POUR BTL) OPTIME
TOPICAL | Status: DC | PRN
  Administered 2022-03-06: 5000 mL

## 2022-03-06 MED ORDER — CHLORHEXIDINE GLUCONATE 0.12 % MT SOLN
OROMUCOSAL | Status: AC
Start: 2022-03-06 — End: 2022-03-06
  Administered 2022-03-06: 15 mL via OROMUCOSAL
  Filled 2022-03-06: qty 15

## 2022-03-06 SURGICAL SUPPLY — 76 items
BAG COUNTER SPONGE SURGICOUNT (BAG) ×2 IMPLANT
BIT DRILL CALIBRATED AFFXS 3.3 (DRILL) IMPLANT
BIT DRILL SURG TIB 3.3X152.5 (DRILL) IMPLANT
BLADE CLIPPER SURG (BLADE) IMPLANT
BLADE SURG 15 STRL LF DISP TIS (BLADE) ×1 IMPLANT
BLADE SURG 15 STRL SS (BLADE) ×1
CLSR STERI-STRIP ANTIMIC 1/2X4 (GAUZE/BANDAGES/DRESSINGS) ×2 IMPLANT
COOLER ICEMAN CLASSIC (MISCELLANEOUS) ×2 IMPLANT
COVER SURGICAL LIGHT HANDLE (MISCELLANEOUS) ×2 IMPLANT
DRAPE HALF SHEET 40X57 (DRAPES) IMPLANT
DRAPE ORTHO SPLIT 77X108 STRL (DRAPES)
DRAPE STERI IOBAN 125X83 (DRAPES) ×2 IMPLANT
DRAPE SURG ORHT 6 SPLT 77X108 (DRAPES) IMPLANT
DRILL CALIBRATED AFFIXUS 3.3 (DRILL) ×2
DRILL SURG TIB 3.3X152.5 (DRILL) ×4
DRSG AQUACEL AG ADV 3.5X 4 (GAUZE/BANDAGES/DRESSINGS) ×1 IMPLANT
DRSG AQUACEL AG ADV 3.5X 6 (GAUZE/BANDAGES/DRESSINGS) ×1 IMPLANT
DRSG AQUACEL AG ADV 3.5X10 (GAUZE/BANDAGES/DRESSINGS) ×1 IMPLANT
DRSG TEGADERM 2-3/8X2-3/4 SM (GAUZE/BANDAGES/DRESSINGS) ×1 IMPLANT
DRSG TEGADERM 4X4.75 (GAUZE/BANDAGES/DRESSINGS) ×1 IMPLANT
ELECT REM PT RETURN 9FT ADLT (ELECTROSURGICAL) ×2
ELECTRODE REM PT RTRN 9FT ADLT (ELECTROSURGICAL) ×1 IMPLANT
FACESHIELD WRAPAROUND (MASK) ×2 IMPLANT
FACESHIELD WRAPAROUND OR TEAM (MASK) ×1 IMPLANT
GAUZE SPONGE 4X4 12PLY STRL LF (GAUZE/BANDAGES/DRESSINGS) ×1 IMPLANT
GAUZE XEROFORM 5X9 LF (GAUZE/BANDAGES/DRESSINGS) ×1 IMPLANT
GLOVE BIOGEL PI IND STRL 8 (GLOVE) ×1 IMPLANT
GLOVE BIOGEL PI INDICATOR 8 (GLOVE) ×1
GLOVE ECLIPSE 8.0 STRL XLNG CF (GLOVE) ×2 IMPLANT
GOWN STRL REUS W/ TWL LRG LVL3 (GOWN DISPOSABLE) ×1 IMPLANT
GOWN STRL REUS W/ TWL XL LVL3 (GOWN DISPOSABLE) IMPLANT
GOWN STRL REUS W/TWL LRG LVL3 (GOWN DISPOSABLE) ×1
GOWN STRL REUS W/TWL XL LVL3 (GOWN DISPOSABLE)
GUIDEWIRE BALL NOSE AFFIXUS (WIRE) ×1 IMPLANT
K-WIRE 2.0 (WIRE) ×1
K-WIRE FXSTD 280X2XNS SS (WIRE) ×1
K-WIRE W/TROCAR TIP 2.5 (WIRE) ×4
KIT BASIN OR (CUSTOM PROCEDURE TRAY) ×2 IMPLANT
KIT TURNOVER KIT B (KITS) ×2 IMPLANT
KWIRE FXSTD 280X2XNS SS (WIRE) IMPLANT
KWIRE W/TROCAR TIP 2.5 (WIRE) IMPLANT
MANIFOLD NEPTUNE II (INSTRUMENTS) ×2 IMPLANT
NAIL IM PROX HUM RT 10X280 (Nail) ×1 IMPLANT
NS IRRIG 1000ML POUR BTL (IV SOLUTION) ×2 IMPLANT
PACK GENERAL/GYN (CUSTOM PROCEDURE TRAY) ×2 IMPLANT
PAD ARMBOARD 7.5X6 YLW CONV (MISCELLANEOUS) ×4 IMPLANT
PAD COLD SHLDR WRAP-ON (PAD) ×1 IMPLANT
REAMER 11 PROX HUMERAL TAPERED (MISCELLANEOUS) ×1 IMPLANT
SCREW ANN CORT BONE 4X28 (Screw) ×2 IMPLANT
SCREW BLUNT TIP AFFIXUS 4X38 (Screw) ×1 IMPLANT
SCREW BLUNT TIP AFFIXUS 4X46 (Screw) ×1 IMPLANT
SCREW BLUNT TIP AFFIXUS 4X48 (Screw) ×1 IMPLANT
SCREW CORT AFFIXUS 4X24 (Screw) ×1 IMPLANT
SCREW CORT AFFIXUS 4X26 (Screw) ×1 IMPLANT
SLING ARM IMMOBILIZER XL (CAST SUPPLIES) ×1 IMPLANT
SPONGE T-LAP 4X18 ~~LOC~~+RFID (SPONGE) IMPLANT
STAPLER VISISTAT 35W (STAPLE) ×2 IMPLANT
SUT 0 FIBERLOOP 38 BLUE TPR ND (SUTURE) ×2
SUT ETHILON 2 0 FS 18 (SUTURE) ×1 IMPLANT
SUT ETHILON 3 0 PS 1 (SUTURE) ×3 IMPLANT
SUT FIBERWIRE 2-0 18 17.9 3/8 (SUTURE) ×2
SUT MON AB 3-0 SH 27 (SUTURE) ×1
SUT MON AB 3-0 SH27 (SUTURE) IMPLANT
SUT VIC AB 0 CT1 27 (SUTURE) ×2
SUT VIC AB 0 CT1 27XBRD ANBCTR (SUTURE) IMPLANT
SUT VIC AB 1 CT1 27 (SUTURE) ×4
SUT VIC AB 1 CT1 27XBRD ANBCTR (SUTURE) IMPLANT
SUT VIC AB 2-0 CT1 27 (SUTURE) ×1
SUT VIC AB 2-0 CT1 TAPERPNT 27 (SUTURE) IMPLANT
SUT VIC AB 2-0 CTB1 (SUTURE) ×2 IMPLANT
SUTURE 0 FIBERLP 38 BLU TPR ND (SUTURE) IMPLANT
SUTURE FIBERWR 2-0 18 17.9 3/8 (SUTURE) IMPLANT
SYR CONTROL 10ML LL (SYRINGE) ×1 IMPLANT
TAPE STRIPS DRAPE STRL (GAUZE/BANDAGES/DRESSINGS) IMPLANT
TOWEL GREEN STERILE (TOWEL DISPOSABLE) ×2 IMPLANT
TOWEL GREEN STERILE FF (TOWEL DISPOSABLE) ×2 IMPLANT

## 2022-03-06 NOTE — Anesthesia Postprocedure Evaluation (Signed)
Anesthesia Post Note ? ?Patient: BRENNON OTTERNESS ? ?Procedure(s) Performed: RIGHT INTRAMEDULLARY (IM) NAIL HUMERAL (Right: Arm Upper) ? ?  ? ?Patient location during evaluation: PACU ?Anesthesia Type: General ?Level of consciousness: awake and alert ?Pain management: pain level controlled ?Vital Signs Assessment: post-procedure vital signs reviewed and stable ?Respiratory status: spontaneous breathing, nonlabored ventilation, respiratory function stable and patient connected to nasal cannula oxygen ?Cardiovascular status: blood pressure returned to baseline and stable ?Postop Assessment: no apparent nausea or vomiting ?Anesthetic complications: no ? ? ?No notable events documented. ? ?Last Vitals:  ?Vitals:  ? 03/06/22 2051 03/06/22 2100  ?BP: (!) 141/68 (!) 147/66  ?Pulse: 74 64  ?Resp: 19 14  ?Temp: 36.7 ?C   ?SpO2: 98% 95%  ?  ?Last Pain:  ?Vitals:  ? 03/06/22 2051  ?PainSc: Asleep  ? ? ?  ?  ?  ?  ?  ?  ? ?[place] ? ? ? ? ?

## 2022-03-06 NOTE — Brief Op Note (Addendum)
? ?  03/06/2022 ? ?7:44 PM ? ?PATIENT:  Lance Pham  71 y.o. male ? ?PRE-OPERATIVE DIAGNOSIS:  right humeral shaft fracture ? ?POST-OPERATIVE DIAGNOSIS:  right humeral shaft fracture ? ?PROCEDURE:  Procedure(s): ?INTRAMEDULLARY (IM) NAIL HUMERAL ? ?SURGEON:  Surgeon(s): ?Meredith Pel, MD ? ?ASSISTANT: magnant pa ? ?ANESTHESIA:   general ? ?EBL: 75 ml   ? ?Total I/O ?In: -  ?Out: 15 [Blood:15] ? ?BLOOD ADMINISTERED: none ? ?DRAINS: none  ? ?LOCAL MEDICATIONS USED:  vanco powder marcaine mso4 clonidine ? ?SPECIMEN:  No Specimen ? ?COUNTS:  YES ? ?TOURNIQUET:  * No tourniquets in log * ? ?DICTATION: .Other Dictation: Dictation Number [id number] ? ?PLAN OF CARE: Admit for overnight observation ? ?PATIENT DISPOSITION:  PACU - hemodynamically stable ? ? ? ? ? ? ? ? ? ? ? ? ?  ?

## 2022-03-06 NOTE — Transfer of Care (Signed)
Immediate Anesthesia Transfer of Care Note ? ?Patient: CHRISTYAN REGER ? ?Procedure(s) Performed: RIGHT INTRAMEDULLARY (IM) NAIL HUMERAL (Right: Arm Upper) ? ?Patient Location: PACU ? ?Anesthesia Type:General ? ?Level of Consciousness: drowsy and patient cooperative ? ?Airway & Oxygen Therapy: Patient Spontanous Breathing ? ?Post-op Assessment: Report given to RN, Post -op Vital signs reviewed and stable and Patient moving all extremities X 4 ? ?Post vital signs: Reviewed and stable ? ?Last Vitals:  ?Vitals Value Taken Time  ?BP 141/68 03/06/22 2050  ?Temp    ?Pulse 63 03/06/22 2055  ?Resp 16 03/06/22 2055  ?SpO2 97 % 03/06/22 2055  ?Vitals shown include unvalidated device data. ? ?Last Pain:  ?Vitals:  ? 03/06/22 1406  ?PainSc: 0-No pain  ?   ? ?Patients Stated Pain Goal: 0 (03/06/22 1406) ? ?Complications: No notable events documented. ?

## 2022-03-06 NOTE — Anesthesia Procedure Notes (Signed)
Procedure Name: Intubation ?Date/Time: 03/06/2022 5:16 PM ?Performed by: Maude Leriche, CRNA ?Pre-anesthesia Checklist: Patient identified, Emergency Drugs available, Suction available and Patient being monitored ?Patient Re-evaluated:Patient Re-evaluated prior to induction ?Oxygen Delivery Method: Circle system utilized ?Preoxygenation: Pre-oxygenation with 100% oxygen ?Induction Type: IV induction ?Ventilation: Mask ventilation without difficulty ?Laryngoscope Size: Sabra Heck and 2 ?Grade View: Grade I ?Tube type: Oral ?Tube size: 7.5 mm ?Number of attempts: 1 ?Airway Equipment and Method: Stylet ?Placement Confirmation: ETT inserted through vocal cords under direct vision, positive ETCO2 and breath sounds checked- equal and bilateral ?Secured at: 23 cm ?Tube secured with: Tape ?Dental Injury: Teeth and Oropharynx as per pre-operative assessment  ? ? ? ? ?

## 2022-03-06 NOTE — H&P (Signed)
Lance Pham is an 71 y.o. male.   ?Chief Complaint: right arm pain ?HPI: Lance Pham is a 71 year old patient with right arm pain.  Had a fall almost 3-1/2  weeks ago.  Was hospitalized with low blood pressure and anemia.  Since he was discharged laboratory values have come back and show that he has multiple myeloma.  He was having some prodromal symptoms in that arm prior to the fall at church.  That was going on about a month beforehand.  I saw him briefly in the hospital and coaptation splint was placed.  Initial plan for treatment with Sarmiento bracing to see if this would heal.  That has not been successful and the patient continues to have gross mobility at the fracture site.  Presents now for intramedullary nailing of this fracture.  Intramedullary nailing is chosen because of the patient's overall health as well as low hemoglobin. ?Past Medical History:  ?Diagnosis Date  ? A-fib ([place])   ? hx of  ? Abnormal myocardial perfusion study 07/14/2018  ? 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 Acute upper respiratory infection 12/04/2020  ? Allergic rhinitis 12/04/2020  ? Arthritis   ? RIGHT knee  ? BBB (bundle branch block)   ? hx of R BBB  ? Benign prostatic hyperplasia with lower urinary tract symptoms 12/04/2020  ? Binge eating disorder 12/04/2020  ? CAD in native artery 06/22/2019  ? Cataract   ? bilateral sx  ? Chest discomfort 07/14/2018  ? Chill 12/04/2020  ? Clotting disorder ([place])   ? has a genetic clotting dx -unknown name  ? Constipation 12/04/2020  ? 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 Cutaneous abscess 12/04/2020  ? Dyslipidemia 07/14/2018  ? Episodic lightheadedness 10/14/2019  ? Essential hypertension 07/14/2018  ? Fever 12/04/2020  ? Gastro-esophageal reflux disease without esophagitis 12/04/2020  ? Generalized anxiety disorder 12/04/2020  ? Hyperglycemia due to type 2 diabetes mellitus ([place]) 12/04/2020  ? pt and wife denies this diagnosis 03/05/22  ? Hyperprolactinemia ([place])  06/02/2017  ? Hypertension   ? on meds  ? Hypothyroidism 12/04/2020  ? Iron deficiency 12/04/2020  ? 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 Mixed hyperlipidemia 12/04/2020  ? 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 Multiple myeloma ([place])   ? Myopathy 12/04/2020  ? Orthostatic hypotension   ? Other long term (current) drug therapy 12/04/2020  ? Other vitamin B12 deficiency anemias 12/04/2020  ? Overactive bladder 12/04/2020  ? Palpitations 10/14/2019  ? Paroxysmal atrial fibrillation ([place]) 06/22/2019  ? Peripheral vascular disease ([place]) 12/04/2020  ? 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 Formatting of this note might be different from the original. 3 mm pituitary microadenoma. Likely nonfunctional.  ? PONV (postoperative nausea and vomiting)   ? RBBB (right bundle branch block) 10/14/2019  ? Recurrent major depression in full remission ([place]) 12/04/2020  ? Shortness of breath 06/08/2019  ? Sleep apnea   ? use CPAP  ? 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 Vitamin D deficiency 12/04/2020  ? Vomiting without nausea 12/04/2020  ? ? ?Past Surgical History:  ?Procedure Laterality Date  ? CATARACT EXTRACTION, BILATERAL    ? CHOLECYSTECTOMY    ? COLONOSCOPY  2017  ? RG-mag citrtae (good)-TICS/TA x 1  ? FOOT SURGERY Left   ? tendon link  ? HAMMER TOE SURGERY    ? KNEE SURGERY Right   ? LAPAROSCOPIC GASTRIC BANDING    ? POLYPECTOMY  2017  ? TA x 1  ? WISDOM TOOTH EXTRACTION    ? WRIST RECONSTRUCTION Right   ? ? ?Family History  ?Problem Relation Age of Onset  ? Colon cancer Mother 26  ? Colon polyps Mother 2  ? Thyroid cancer Father 44  ?     parathyroid CA  ? Colon polyps Sister   ? Colon polyps Brother   ? Colon polyps Sister   ? Colon polyps Sister   ? Colon polyps Sister   ? Esophageal cancer Neg Hx   ? Stomach cancer Neg Hx   ? Rectal cancer Neg Hx   ? ?Social History:  reports that he has never smoked. He has never  used smokeless tobacco. He reports that he does not drink alcohol and does not use drugs. ? ?Allergies:  ?Allergies  ?Allergen Reactions  ? Nsaids Other (See Comments)  ?  Patient had a Lap band placed is suppose to have NO NSAIDS due to the possibility of esophageal erosion leading to band "coming through"  ? Tamiflu [Oseltamivir] Nausea Only and Other (See Comments)  ?  Stomach upset ?Per patient never had a problem  ? ? ?Medications Prior to Admission  ?Medication Sig Dispense Refill  ? acetaminophen (TYLENOL) 500 MG tablet Take 1,000 mg by mouth every 6 (six) hours as needed for mild pain or headache.    ? albuterol (VENTOLIN HFA) 108 (90 Base) MCG/ACT inhaler Inhale 1 puff into the lungs every 4 (four) hours as needed for wheezing or shortness of breath.    ? ALPRAZolam (XANAX) 0.5 MG tablet Take 0.5 mg by mouth daily as needed for anxiety.    ? apixaban (ELIQUIS) 5 MG TABS tablet Take 5 mg by mouth 2 (two) times daily.    ? benzonatate (TESSALON) 200 MG capsule Take 200 mg by mouth 3 (three) times daily as needed for cough.    ? buPROPion (WELLBUTRIN SR) 150 MG 12 hr tablet Take 150 mg by mouth at bedtime.    ? cyanocobalamin (,VITAMIN B-12,) 1000 MCG/ML injection Inject 1 mL (1,000 mcg total) into the muscle every 30 (thirty) days. 1 mL 0  ? ferrous sulfate 324 (65 Fe) MG TBEC Take 324 mg by mouth daily after supper.    ? FLUoxetine (PROZAC) 40 MG capsule Take 40 mg by mouth at bedtime.    ? FOLIC ACID PO Take 937 mcg by mouth at bedtime.    ? midodrine (PROAMATINE) 10 MG tablet Take 10 mg by mouth 3 (three) times daily.    ? ondansetron (ZOFRAN) 8 MG tablet Take 8 mg by mouth daily as needed for nausea or vomiting.    ? polyethylene glycol (MIRALAX / GLYCOLAX) 17 g packet Take 17 g by mouth daily as needed for mild constipation or moderate constipation. 14 each 0  ? pyridostigmine (MESTINON) 60 MG tablet Take 0.5 tablets (30 mg total) by mouth 2 (two) times daily. 30 tablet 0  ? senna-docusate (SENOKOT-S)  8.6-50 MG tablet Take 2 tablets by mouth at bedtime as needed for mild constipation or moderate constipation. 10 tablet 0  ? tamsulosin (FLOMAX) 0.4 MG CAPS capsule Take 0.4 mg by mouth at bedtime.    ? ? ?Results for orders placed or performed during the hospital encounter of 03/06/22 (from the past 48 hour(s))  ?Type and screen [place]     Status: None (Preliminary result)  ? Collection Time: 03/06/22  2:50 PM  ?Result Value Ref Range  ?  ABO/RH(D) O NEG   ? Antibody Screen NEG   ? Sample Expiration    ?  03/09/2022,2359 ?Performed at [facility], [place] [address] [place], [place] [address] ?  ? Unit Number [id number]   ? Blood Component Type RED CELLS,LR   ? Unit division 00   ? Status of Unit ALLOCATED   ? Transfusion Status OK TO TRANSFUSE   ? Crossmatch Result Compatible   ? Unit Number [id number]   ? Blood Component Type RED CELLS,LR   ? Unit division 00   ? Status of Unit ALLOCATED   ? Transfusion Status OK TO TRANSFUSE   ? Crossmatch Result Compatible   ?Prepare RBC (crossmatch)     Status: None  ? Collection Time: 03/06/22  3:04 PM  ?Result Value Ref Range  ? Order Confirmation    ?  ORDER PROCESSED BY BLOOD BANK ?Performed at [facility], [place] [address], [place], [place] [address] ?  ? ?No results found. ? ?Review of Systems  ?Constitutional:  Positive for fatigue.  ?Musculoskeletal:  Positive for arthralgias.  ? ?Blood pressure 127/77, pulse 63, temperature 98.2 ?F (36.8 ?C), resp. rate 19, height 6' 2"  (1.88 m), weight 118.4 kg, SpO2 98 %. ?Physical Exam ?Vitals reviewed.  ?HENT:  ?   Head: Normocephalic.  ?   Nose: Nose normal.  ?   Mouth/Throat:  ?   Mouth: Mucous membranes are moist.  ?Eyes:  ?   Pupils: Pupils are equal, round, and reactive to light.  ?Cardiovascular:  ?   Rate and Rhythm: Normal rate.  ?   Pulses: Normal pulses.  ?Pulmonary:  ?   Effort: Pulmonary effort is normal.  ?Abdominal:  ?   General: Abdomen is flat.  ?Musculoskeletal:  ?   Cervical  back: Normal range of motion.  ?Skin: ?   General: Skin is warm.  ?   Capillary Refill: Capillary refill takes less than 2 seconds.  ?Neurological:  ?   General: No focal deficit present.  ?   Mental Status:

## 2022-03-07 ENCOUNTER — Encounter (HOSPITAL_COMMUNITY): Payer: Self-pay | Admitting: Oncology

## 2022-03-07 ENCOUNTER — Encounter (HOSPITAL_COMMUNITY): Payer: Self-pay | Admitting: Orthopedic Surgery

## 2022-03-07 DIAGNOSIS — E871 Hypo-osmolality and hyponatremia: Secondary | ICD-10-CM | POA: Diagnosis present

## 2022-03-07 DIAGNOSIS — I480 Paroxysmal atrial fibrillation: Secondary | ICD-10-CM

## 2022-03-07 DIAGNOSIS — N1831 Chronic kidney disease, stage 3a: Secondary | ICD-10-CM

## 2022-03-07 DIAGNOSIS — I951 Orthostatic hypotension: Secondary | ICD-10-CM

## 2022-03-07 DIAGNOSIS — S42301A Unspecified fracture of shaft of humerus, right arm, initial encounter for closed fracture: Secondary | ICD-10-CM

## 2022-03-07 DIAGNOSIS — C9000 Multiple myeloma not having achieved remission: Secondary | ICD-10-CM | POA: Diagnosis not present

## 2022-03-07 DIAGNOSIS — M84421K Pathological fracture, right humerus, subsequent encounter for fracture with nonunion: Secondary | ICD-10-CM | POA: Diagnosis not present

## 2022-03-07 LAB — BASIC METABOLIC PANEL
Anion gap: 3 — ABNORMAL LOW (ref 5–15)
BUN: 20 mg/dL (ref 8–23)
CO2: 23 mmol/L (ref 22–32)
Calcium: 8.9 mg/dL (ref 8.9–10.3)
Chloride: 101 mmol/L (ref 98–111)
Creatinine, Ser: 1.49 mg/dL — ABNORMAL HIGH (ref 0.61–1.24)
GFR, Estimated: 50 mL/min — ABNORMAL LOW (ref >60)
Glucose, Bld: 134 mg/dL — ABNORMAL HIGH (ref 70–99)
Potassium: 5.1 mmol/L (ref 3.5–5.1)
Sodium: 127 mmol/L — ABNORMAL LOW (ref 135–145)

## 2022-03-07 LAB — IGG, IGA, IGM

## 2022-03-07 LAB — CBC
HCT: 26.6 % — ABNORMAL LOW (ref 39.0–52.0)
Hemoglobin: 8.2 g/dL — ABNORMAL LOW (ref 13.0–17.0)
MCH: 30.7 pg (ref 26.0–34.0)
MCHC: 30.8 g/dL (ref 30.0–36.0)
MCV: 99.6 fL (ref 80.0–100.0)
Platelets: 145 10*3/uL — ABNORMAL LOW (ref 150–400)
RBC: 2.67 MIL/uL — ABNORMAL LOW (ref 4.22–5.81)
RDW: 22.1 % — ABNORMAL HIGH (ref 11.5–15.5)
WBC: 17.3 10*3/uL — ABNORMAL HIGH (ref 4.0–10.5)
nRBC: 1 % — ABNORMAL HIGH (ref 0.0–0.2)

## 2022-03-07 LAB — KAPPA/LAMBDA LIGHT CHAINS

## 2022-03-07 LAB — TYPE AND SCREEN
ABO/RH(D): O NEG
Antibody Screen: NEGATIVE

## 2022-03-07 MED ORDER — METHOCARBAMOL 500 MG PO TABS
500.0000 mg | ORAL_TABLET | Freq: Four times a day (QID) | ORAL | 0 refills | Status: AC | PRN
Start: 2022-03-07 — End: ?

## 2022-03-07 MED ORDER — FLUOXETINE HCL 20 MG PO CAPS: 40.0000 mg | ORAL_CAPSULE | Freq: Every day | ORAL | Status: DC

## 2022-03-07 MED ORDER — MIDODRINE HCL 5 MG PO TABS
10.0000 mg | ORAL_TABLET | Freq: Three times a day (TID) | ORAL | Status: DC
  Administered 2022-03-07: 10 mg via ORAL
  Filled 2022-03-07: qty 2

## 2022-03-07 MED ORDER — OXYCODONE HCL 5 MG PO TABS: 5.0000 mg | ORAL_TABLET | ORAL | 0 refills | Status: DC | PRN

## 2022-03-07 MED ORDER — ENSURE ENLIVE PO LIQD
237.0000 mL | Freq: Two times a day (BID) | ORAL | Status: DC
  Administered 2022-03-07: 237 mL via ORAL

## 2022-03-07 MED ORDER — MIDODRINE HCL 5 MG PO TABS: 10.0000 mg | ORAL_TABLET | Freq: Three times a day (TID) | ORAL | Status: DC

## 2022-03-07 MED ORDER — ALPRAZOLAM 0.25 MG PO TABS: 0.5000 mg | ORAL_TABLET | Freq: Every day | ORAL | Status: DC | PRN

## 2022-03-07 NOTE — Assessment & Plan Note (Signed)
-  At or near usual baseline ?-If still orthostatic at noon, can place another IV and give a small bolus ?-Will encourage PO intake ?-PCP follow-up next week ?

## 2022-03-07 NOTE — Progress Notes (Signed)
?  Subjective: ?Patient stable.  Pain controlled.  ? ?Objective: ?Vital signs in last 24 hours: ?Temp:  [97.5 ?F (36.4 ?C)-98.2 ?F (36.8 ?C)] 97.6 ?F (36.4 ?C) (05/05 0524) ?Pulse Rate:  [60-74] 67 (05/05 0524) ?Resp:  [14-19] 17 (05/05 0524) ?BP: (127-169)/(66-88) 169/73 (05/05 0524) ?SpO2:  [93 %-98 %] 98 % (05/05 0524) ?Weight:  [118.4 kg] 118.4 kg (05/04 1354) ? ?Intake/Output from previous day: ?05/04 0701 - 05/05 0700 ?In: 2216.3 [P.O.:260; I.V.:1756.3; IV Piggyback:200] ?Out: 500 [Urine:350; Blood:150] ?Intake/Output this shift: ?No intake/output data recorded. ? ?Exam: ? ?Neurovascular intact ?Sensation intact distally ?Intact pulses distally ? ?Labs: ?Recent Labs  ?  03/05/22 ?0000 03/06/22 ?2226 03/07/22 ?0112  ?HGB 8.7* 8.4* 8.2*  ? ?Recent Labs  ?  03/06/22 ?2226 03/07/22 ?0112  ?WBC 16.0* 17.3*  ?RBC 2.68* 2.67*  ?HCT 26.3* 26.6*  ?PLT 148* 145*  ? ?Recent Labs  ?  03/06/22 ?2226 03/07/22 ?0112  ?NA 129* 127*  ?K 5.0 5.1  ?CL 101 101  ?CO2 24 23  ?BUN 19 20  ?CREATININE 1.48* 1.49*  ?GLUCOSE 137* 134*  ?CALCIUM 8.8* 8.9  ? ?No results for input(s): LABPT, INR in the last 72 hours. ? ?Assessment/Plan: ?Plan at this time is prepared for discharge today.  Laboratory values including hemoglobin look relatively unchanged compared to preop.  However his sodium has drifted down to 127.  Would like medical consult for that value prior to discharge.  Otherwise he is good to go from orthopedic standpoint. ? ? ?Lance Pham ?03/07/2022, 7:10 AM  ? ? ?  ?

## 2022-03-07 NOTE — Assessment & Plan Note (Signed)
-  Has not yet started treatment ?-Followed by Dr. Anabel Bene in [place] ?

## 2022-03-07 NOTE — Consult Note (Signed)
Initial Consultation Note ? ? ?Patient: Lance Pham MGQ:[id number] DOB: 1951/01/30 PCP: Bonnita Nasuti, MD ?DOA: 03/06/2022 ?DOS: the patient was seen and examined on 03/07/2022 ?Primary service: Meredith Pel, MD ? ?Referring physician: Marlou Sa - orthopedics ? ?Reason for consult: Hyponatremia.  Multiple myeloma, pathologic fracture, nail placed last night.  DC today, just want to make sure he is good to go. ? ?Assessment/Plan: ?Assessment and Plan: ?* Fracture of humeral shaft, closed ?-Patient with pathologic fracture of humeral head ?-s/p repair ?-In a sling ?-Management per orthopedics ? ?Orthostatic hypotension ?-He takes midodrine 10 mg TID and has not received this medication for the last 3 doses ?-He feels light-headed and orthostatic this AM ?-Will order ASAP ?-Will check orthostatics later today and if improved he should be appropriate for dc ? ?Hyponatremia ?-At or near usual baseline ?-If still orthostatic at noon, can place another IV and give a small bolus ?-Will encourage PO intake ?-PCP follow-up next week ? ?Paroxysmal atrial fibrillation (H[facility]) ?-Per cardiology, needs to resume Eliquis ASAP when approved by orthopedics ? ?Chronic kidney disease, stage 3a ([place]) ?-Appears to be stable, possible slightly higher than usual baseline ?-Push PO fluids ? ?Multiple myeloma ([place]) ?-Has not yet started treatment ?-Followed by Dr. Anabel Bene in [place] ? ? ? ? ? ? ?[facility] will continue to follow the patient. ? ?HPI: Lance Pham is a 71 y.o. male with past medical history of acute intermittent porphyria; afib; BPH; CAD; HLD; HTN; anxiety/depression; hypothyroidism; hyperprolactinemia; Luetscher's syndrome (mineralcorticoid excess); multiple myeloma; PVD; OSA on CPAP; and CVA presenting for repair of a pathologic fracture of the R humeral shaft, associated with multiple myeloma.  The patient reports recent diagnosis of MM/leukemia, has not started treatment yet.  He is aware of chronic hyponatremia and not  overly concerned about this.  However, he has chronic orthostatic hypotension for which he takes midodrine and this was not ordered; he is feeling light-headed this AM and having difficulty participating in therapy.  His IV was taken out in anticipation of dc today and he is eager to go. ? ? ? ?Review of Systems: As mentioned in the history of present illness. All other systems reviewed and are negative. ?Past Medical History:  ?Diagnosis Date  ? Acute intermittent porphyria ([place]) 12/04/2020  ? Allergic rhinitis 12/04/2020  ? Arthritis   ? RIGHT knee  ? Benign prostatic hyperplasia with lower urinary tract symptoms 12/04/2020  ? Binge eating disorder 12/04/2020  ? CAD in native artery 06/22/2019  ? Clotting disorder ([place])   ? has a genetic clotting dx -unknown name  ? Cutaneous abscess 12/04/2020  ? Dyslipidemia 07/14/2018  ? Essential hypertension 07/14/2018  ? Gastro-esophageal reflux disease without esophagitis 12/04/2020  ? Generalized anxiety disorder 12/04/2020  ? Hyperprolactinemia ([place]) 06/02/2017  ? Hypothyroidism 12/04/2020  ? 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 Moderate recurrent major depression ([place]) 12/04/2020  ? Multiple myeloma ([place])   ? Orthostatic hypotension   ? Other vitamin B12 deficiency anemias 12/04/2020  ? Overactive bladder 12/04/2020  ? Paroxysmal atrial fibrillation ([place]) 06/22/2019  ? Peripheral vascular disease ([place]) 12/04/2020  ? 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 Formatting of this note might be different from the original. 3 mm pituitary microadenoma. Likely nonfunctional.  ? PONV (postoperative nausea and vomiting)   ? RBBB (right bundle branch block) 10/14/2019  ? Sleep apnea   ? use CPAP  ? 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 Vitamin D deficiency 12/04/2020  ? ?Past Surgical History:  ?Procedure Laterality  Date  ? CATARACT EXTRACTION, BILATERAL    ? CHOLECYSTECTOMY    ? COLONOSCOPY  2017  ? RG-mag citrtae (good)-TICS/TA x 1  ? FOOT SURGERY Left   ? tendon link  ? HAMMER  TOE SURGERY    ? KNEE SURGERY Right   ? LAPAROSCOPIC GASTRIC BANDING    ? POLYPECTOMY  2017  ? TA x 1  ? WISDOM TOOTH EXTRACTION    ? WRIST RECONSTRUCTION Right   ? ?Social History:  reports that he has never smoked. He has never used smokeless tobacco. He reports that he does not drink alcohol and does not use drugs. ? ?Allergies  ?Allergen Reactions  ? Nsaids Other (See Comments)  ?  Patient had a Lap band placed is suppose to have NO NSAIDS due to the possibility of esophageal erosion leading to band "coming through"  ? Tamiflu [Oseltamivir] Nausea Only and Other (See Comments)  ?  Stomach upset ?Per patient never had a problem  ? ? ?Family History  ?Problem Relation Age of Onset  ? Colon cancer Mother 58  ? Colon polyps Mother 16  ? Thyroid cancer Father 51  ?     parathyroid CA  ? Colon polyps Sister   ? Colon polyps Brother   ? Colon polyps Sister   ? Colon polyps Sister   ? Colon polyps Sister   ? Esophageal cancer Neg Hx   ? Stomach cancer Neg Hx   ? Rectal cancer Neg Hx   ? ? ?Prior to Admission medications   ?Medication Sig Start Date End Date Taking? Authorizing Provider  ?acetaminophen (TYLENOL) 500 MG tablet Take 1,000 mg by mouth every 6 (six) hours as needed for mild pain or headache.   Yes [provider]  ?albuterol (VENTOLIN HFA) 108 (90 Base) MCG/ACT inhaler Inhale 1 puff into the lungs every 4 (four) hours as needed for wheezing or shortness of breath.   Yes [provider]  ?ALPRAZolam Duanne Moron) 0.5 MG tablet Take 0.5 mg by mouth daily as needed for anxiety. 02/24/22  Yes [provider]  ?apixaban (ELIQUIS) 5 MG TABS tablet Take 5 mg by mouth 2 (two) times daily.   Yes [provider]  ?benzonatate (TESSALON) 200 MG capsule Take 200 mg by mouth 3 (three) times daily as needed for cough. 01/27/22  Yes [provider]  ?buPROPion (WELLBUTRIN SR) 150 MG 12 hr tablet Take 150 mg by mouth at bedtime. 10/12/21  Yes [provider]  ?cyanocobalamin  (,VITAMIN B-12,) 1000 MCG/ML injection Inject 1 mL (1,000 mcg total) into the muscle every 30 (thirty) days. 02/12/22  Yes Wayland Denis, MD  ?ferrous sulfate 324 (65 Fe) MG TBEC Take 324 mg by mouth daily after supper.   Yes [provider]  ?FLUoxetine (PROZAC) 40 MG capsule Take 40 mg by mouth at bedtime. 01/16/20  Yes [provider]  ?FOLIC ACID PO Take 419 mcg by mouth at bedtime.   Yes [provider]  ?midodrine (PROAMATINE) 10 MG tablet Take 10 mg by mouth 3 (three) times daily.   Yes [provider]  ?ondansetron (ZOFRAN) 8 MG tablet Take 8 mg by mouth daily as needed for nausea or vomiting. 02/24/22  Yes [provider]  ?polyethylene glycol (MIRALAX / GLYCOLAX) 17 g packet Take 17 g by mouth daily as needed for mild constipation or moderate constipation. 02/12/22  Yes Wayland Denis, MD  ?pyridostigmine (MESTINON) 60 MG tablet Take 0.5 tablets (30 mg total) by mouth 2 (  two) times daily. 02/12/22 03/14/22 Yes Wayland Denis, MD  ?senna-docusate (SENOKOT-S) 8.6-50 MG tablet Take 2 tablets by mouth at bedtime as needed for mild constipation or moderate constipation. 02/12/22  Yes Wayland Denis, MD  ?tamsulosin (FLOMAX) 0.4 MG CAPS capsule Take 0.4 mg by mouth at bedtime.   Yes [provider]  ?methocarbamol (ROBAXIN) 500 MG tablet Take 1 tablet (500 mg total) by mouth every 6 (six) hours as needed for muscle spasms. 03/07/22   Meredith Pel, MD  ?oxyCODONE (OXY IR/ROXICODONE) 5 MG immediate release tablet Take 1 tablet (5 mg total) by mouth every 4 (four) hours as needed for moderate pain (pain score 4-6). 03/07/22   Meredith Pel, MD  ? ? ?Physical Exam: ?Vitals:  ? 03/06/22 2225 03/07/22 0012 03/07/22 5615 03/07/22 3794  ?BP: (!) 144/88 (!) 145/68 (!) 169/73 118/87  ?Pulse: 65 60 67 71  ?Resp: 17 17 17 18   ?Temp: 98 ?F (36.7 ?C) (!) 97.5 ?F (36.4 ?C) 97.6 ?F (36.4 ?C) 98.4 ?F (36.9 ?C)  ?TempSrc: Oral Oral Oral Oral  ?SpO2: 96% 96% 98% 96%  ?Weight:       ?Height:      ? ?General:  Appears calm and comfortable and is in NAD, sitting at the side of the bed ?Eyes:   EOMI, normal lids, iris ?ENT:  grossly normal hearing, lips & tongue, mmm ?Neck:  no LAD, masse

## 2022-03-07 NOTE — Assessment & Plan Note (Signed)
-  He takes midodrine 10 mg TID and has not received this medication for the last 3 doses ?-He feels light-headed and orthostatic this AM ?-Will order ASAP ?-Will check orthostatics later today and if improved he should be appropriate for dc ?

## 2022-03-07 NOTE — Progress Notes (Signed)
Discharge instructions provided to patient, patient verbalizes understanding. Follow up appointment and medications reviewed. Ice man given to patient to go home with. Patient discharged  ?

## 2022-03-07 NOTE — Assessment & Plan Note (Signed)
-  Per cardiology, needs to resume Eliquis ASAP when approved by orthopedics ?

## 2022-03-07 NOTE — Plan of Care (Signed)
  Problem: Health Behavior/Discharge Planning: Goal: Ability to manage health-related needs will improve Outcome: Progressing   

## 2022-03-07 NOTE — Assessment & Plan Note (Signed)
-  Patient with pathologic fracture of humeral head ?-s/p repair ?-In a sling ?-Management per orthopedics ?

## 2022-03-07 NOTE — Evaluation (Signed)
Occupational Therapy Evaluation ?Patient Details ?Name: Lance Pham ?MRN: [id number] ?DOB: 02/08/1951 ?Today's Date: 03/07/2022 ? ? ?History of Present Illness 72 yo male s/p fall w/ resulting Rt humerus fx in early April, now s/p IM nailing of humeral shaft fracture on 03/06/22. 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  ? ?Clinical Impression ?  ?Pt admitted as above presenting with deficits as listed below (refer to OT problem list). Pt lives with his wife in 2 story home with 1 STE, but can live on main level. His wife can provide necessary PRN assist for ADL's and self care. Pt/spouse were educated in sling at all time R UE, NWB R UE, bathing and dressing techniques as well as no active or passive ROM to shoulder, active ROM elbow, wrist and hand. Pt was noted to be Min-Mod A for LB ADL's and Min A UB ADL's. Pt w/ orthostatic hypotension during this assessment and MD/hospitalist is aware and ordering pt's medications as he stated that he usually takes these 3x/day when at home. Will follow for OT as acute in-pt to assist in maximizing independence with ADL's and functional mobility related to ADL's. ? ?Orthostatic BPs ? ?Supine NT  ?Sitting 118/87  ?Sitting after 5 min 126/79  ?Standing 85/51  ?Standing after  min NT  ?  ?   ? ?Recommendations for follow up therapy are one component of a multi-disciplinary discharge planning process, led by the attending physician.  Recommendations may be updated based on patient status, additional functional criteria and insurance authorization.  ? ?Follow Up Recommendations ? Home health OT  ?  ?Assistance Recommended at Discharge PRN  ?Patient can return home with the following A little help with  bathing/dressing/bathroom;A little help with walking and/or transfers;Assistance with cooking/housework;Help with stairs or ramp for entrance;Assist for transportation ? ?  ?Functional Status Assessment ? Patient has had a recent decline in their functional status and demonstrates the ability to make significant improvements in function in a reasonable and predictable amount of time.  ?Equipment Recommendations ? None recommended by OT (Pt reports that he can borrow 3:1 from sister is needed at d/c)  ?  ?Recommendations for Other Services PT consult ? ? ?  ?Precautions / Restrictions Precautions ?Precautions: Fall ?Precaution Comments: NWB RUE, sling at all times except bathing/dressing and elbow, wrist and hand ex's. No R shoulder ROM. Watch BP - h/o orthosdtatic hypotension (per pt report, takes medication 3x/day at home). ?Required Braces or Orthoses: Sling (Sling at all times except dressing, bathing and elbow/wrist and hand ex's. No Active or Passive ROM to R shoulder) ?Restrictions ?Weight Bearing Restrictions: Yes ?RUE Weight Bearing: Non weight bearing  ? ?  ? ?Mobility Bed Mobility ?Overal bed mobility: Needs Assistance ?Bed Mobility: Supine to Sit, Sidelying to Sit ?  ?Sidelying to sit: Supervision, HOB elevated ?Supine to sit: Supervision, HOB elevated ?  ?  ?General bed mobility comments: No hands on assist. ?  ? ?Transfers ?Overall transfer level: Needs assistance ?Equipment used: None ?Transfers: Sit to/from Stand ?Sit to Stand: Min guard, Mod assist ?  ?  ?General transfer comment: Min guard assist with initial sit to stand at EOB changing to Mod A as pt became hypotensive and required Mod assistance to sit on EOB when experiencing dizziness and light headed. ?  ? ?  ?  Balance Overall balance assessment: Needs assistance, Mild deficits observed, not formally tested ?Sitting-balance support: Single extremity supported, No upper extremity supported, Feet supported ?Sitting balance-Leahy Scale: Good ?  ?   ?Standing balance support: No upper extremity supported, Single extremity supported ?Standing balance-Leahy Scale: Poor ?Standing balance comment: Pt was Min guard assist initially then required Mod A secondary to becoming hypotensive in standing with posterior lean. Pt assisted back to bed - MD aware and to order medication ?   ? ?ADL either performed or assessed with clinical judgement  ? ?ADL Overall ADL's : Needs assistance/impaired ?Eating/Feeding: Set up;Bed level ?Eating/Feeding Details (indicate cue type and reason): Pt deferred eating reporting h/o loss of appetite ?Grooming: Set up;Sitting ?Grooming Details (indicate cue type and reason): Sitting at EOB ?Upper Body Bathing: Set up;Sitting ?  ?Lower Body Bathing: Moderate assistance;Sit to/from stand;Sitting/lateral leans ?Lower Body Bathing Details (indicate cue type and reason): Pt was noted to be hypotensive in standing. MD aware and ordering medications ?Upper Body Dressing : Minimal assistance;Sitting ?Upper Body Dressing Details (indicate cue type and reason): Pt and spouse educated in dressing techniques following surgery and No A/ROM R UE, as well as NWB ?Lower Body Dressing: Sitting/lateral leans;Sit to/from stand;Moderate assistance ?Lower Body Dressing Details (indicate cue type and reason): Mod assist in sit to stand secondary to hypotensive ?Toilet Transfer: Moderate assistance;Stand-pivot;BSC/3in1 ?Toilet Transfer Details (indicate cue type and reason): Simulated sit to stand from EOB. Pt reports that sister has a 3:1 if needed but feels as though he "will be fine once I have my medicine" for hypotension ?Toileting- Clothing Manipulation and Hygiene: Moderate assistance;Sit to/from stand;Sitting/lateral lean ?  ?  ?  ?Functional mobility during ADLs:  (Deferred functional mobility in room/bathroom secondary to hypotension) ?General ADL Comments: Pt and his wife were educated in sling use at all times, bathing and dressing techniques as well  as NWB and no active or passive ROM to R shoulder following humeral shaft fracture with IM nailing on 03/06/22. Pt was also educated in acitve elbow, wrist and hand ROM, positioning of R UE during sleep. Pt was noted to be hypotensive during this session with sit to stand and pt/wife stated that pt is awaiting his medication "that he usually takes 3 times a day at home". OOB transfers to bathroom were deferred secondary to hypotension. He would benefit from PT assessment and OT assessment of functional mobility into bathroom and simulated shower transfer if still here tomorrow.  ? ? ? ?Vision Baseline Vision/History: 1 Wears glasses ?Patient Visual Report: No change from baseline ?Vision Assessment?: No apparent visual deficits  ?   ?   ?   ? ?Pertinent Vitals/Pain Pain Assessment ?Pain Assessment: Faces ?Faces Pain Scale: Hurts even more ?Pain Location: R shoulder ?Pain Descriptors / Indicators: Guarding, Discomfort, Grimacing ?Pain Intervention(s): Limited activity within patient's tolerance, Monitored during session, Repositioned  ? ? ? ?Hand Dominance Right ?  ?Extremity/Trunk Assessment Upper Extremity Assessment ?Upper Extremity Assessment: RUE deficits/detail;Generalized weakness ?RUE Deficits / Details: NWB R UE, No acitve or Passive ROM, sling at all times except for bathing/dressing and elbow, wrist and hand A/ROM ?RUE: Unable to fully assess due to immobilization ?  ?Lower Extremity Assessment ?Lower Extremity Assessment: Defer to PT evaluation;Generalized weakness ?  ?  ?  ?Communication Communication ?Communication: HOH ?  ?Cognition Arousal/Alertness: Awake/alert ?Behavior During Therapy: [facility] for tasks assessed/performed, Flat affect ?Overall Cognitive Status: Within Functional Limits for tasks assessed ?  ?  ?General Comments: Pt was A & O  x4 however he was noted to defer some questions to his wife. He was also orthostatic during this session as he had not had "last 3 doses of medication for hypotension  since shoulder surgery" per pt wife. ?  ?  ?General Comments  Pt was noted to be hypotensive with activity and sit to stand in preparation for functional transfers/dressing today. MD is aware: BP in sitting: upon OT arrival 82

## 2022-03-07 NOTE — Progress Notes (Signed)
Occupational Therapy Treatment ?Patient Details ?Name: Lance Pham ?MRN: [id number] ?DOB: 1950-11-26 ?Today's Date: 03/07/2022 ? ? ?History of present illness 71 yo male s/p fall w/ resulting Rt humerus fx in early April, now s/p IM nailing of humeral shaft fracture on 03/06/22. 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 ?  ?OT comments ? Pt seen for second OT treatment session today after RN contacted stating that pt had had his medications and orthostatic vitals were just assessed and could OT assess toilet and shower transfers. Pt is currently Mod I for bed mobility, LB dressing, toileting transfers, hygiene and standing at sink. Pt was also Mod I for simulated shower transfer and simulated "1 step up into house". Pt stating that he feels much better after had medications and is hopeful to d/c home. All shoulder precautions and education completed earlier today. Will sign off acute OT as pt/family education is complete, no further needs at this time.  ? ?Recommendations for follow up therapy are one component of a multi-disciplinary discharge planning process, led by the attending physician.  Recommendations may be updated based on patient status, additional functional criteria and insurance authorization. ?   ?Follow Up Recommendations ? Home health OT  ?  ?Assistance Recommended at Discharge PRN  ?Patient can return home with the following ? A little help with bathing/dressing/bathroom;A little help with walking and/or transfers;Assistance with cooking/housework;Help with stairs or ramp for entrance;Assist for transportation ?  ?Equipment Recommendations ? None recommended by OT  ?  ?Recommendations for Other Services PT consult ? ?  ?Precautions /  Restrictions Precautions ?Precautions: Fall ?Precaution Comments: NWB RUE, sling at all times except bathing/dressing and elbow, wrist and hand ex's. No R shoulder ROM. Watch BP - h/o orthosdtatic hypotension (per pt report, takes medication 3x/day at home). ?Required Braces or Orthoses: Sling (Sling at all times except dressing, bathing and elbow/wrist and hand ex's. No Active or Passive ROM to R shoulder) ?Restrictions ?Weight Bearing Restrictions: Yes ?RUE Weight Bearing: Non weight bearing  ? ? ?  ? ?Mobility Bed Mobility ?Overal bed mobility: Needs Assistance ?Bed Mobility: Supine to Sit, Sidelying to Sit, Sit to Supine ?  ?Sidelying to sit: Modified independent (Device/Increase time), HOB elevated ?Supine to sit: Modified independent (Device/Increase time), HOB elevated ?Sit to supine: Modified independent (Device/Increase time), HOB elevated ?  ?General bed mobility comments: No hands on assist. ?  ? ?Transfers ?Overall transfer level: Needs assistance ?Equipment used: None ?Transfers: Sit to/from Stand ?Sit to Stand: Modified independent (Device/Increase time) ?  ?  ?General transfer comment: Mod I sit to stand at EOB, pt w/o c/o dizziness after having had medications earlier today. ?  ?  ?Balance Overall balance assessment: Modified Independent, Mild deficits observed, not formally tested ?Sitting-balance support: No upper extremity supported, Feet supported ?Sitting balance-Leahy Scale: Good ?  ?  ?Standing balance support: No upper extremity supported ?Standing balance-Leahy Scale: Good ?Standing balance comment: Pt is mod I standing at this time, denies feeling dizzy/not light headed, no LOB. ?   ? ?ADL either performed or assessed with clinical judgement  ? ?ADL Overall ADL's : Needs assistance/impaired ?Eating/Feeding: Modified independent;Sitting ?Eating/Feeding Details (indicate cue type and reason): Pt deferred eating reporting h/o loss of appetite ?Grooming: Set up;Standing ?Grooming Details  (indicate cue type and reason):  Sitting at EOB ?Upper Body Bathing: Set up;Sitting ?  ?Lower Body Bathing: Moderate assistance;Sit to/from stand;Sitting/lateral leans ?Lower Body Bathing Details (indicate cue type and reason): Pt was noted to be hypotensive in standing. MD aware and ordering medications ?Upper Body Dressing : Minimal assistance;Sitting ?Upper Body Dressing Details (indicate cue type and reason): Pt and spouse educated in dressing techniques following surgery and No A/ROM R UE, as well as NWB ?Lower Body Dressing: Sitting/lateral leans;Sit to/from stand;Moderate assistance ?Lower Body Dressing Details (indicate cue type and reason): Mod assist in sit to stand secondary to hypotensive ?Toilet Transfer: Modified Independent;Ambulation;Regular Toilet ?Toilet Transfer Details (indicate cue type and reason): Simulated sit to stand from EOB. Pt reports that sister has a 3:1 if needed but feels as though he "will be fine once I have my medicine" for hypotension ?Toileting- Clothing Manipulation and Hygiene: Modified independent;Sitting/lateral lean;Sit to/from stand ?  ?Tub/ Shower Transfer: Gaffer;Modified independent;Ambulation;Shower seat ?Tub/Shower Transfer Details (indicate cue type and reason): Simulated 1 step up and over into shower at mod I level. Pt spouse will be able to assist PRN at home. He has a built in Civil engineer, contracting and sister has a 3:1 if needed at any time per pt report ?Functional mobility during ADLs:  (Deferred functional mobility in room/bathroom secondary to hypotension) ?General ADL Comments: Pt seen for second OT treatment session today after RN contacted stating that pt had had his medications and orthostatic vitals were just assessed and could OT assess toilet and shower transfers. Pt is currently Mod I for bed mobility, LB dressing, toileting transfers, hygiene and standing at sink. Pt was also Mod I for simulated shower transfer and "1 step up into house". Pt stating  that he feels much better after had medications and is hopeful to d/c home. Will sign off acute OT as pt/family education is complete, no further needs at this time. ?  ? ?Extremity/Trunk Assessment Upper Extremity Assessment ?Upper Extremity Assessment: RUE deficits/detail;Generalized weakness ?RUE Deficits / Details: NWB R UE, No acitve or Passive ROM, sling at all times except for bathing/dressing and elbow, wrist and hand A/ROM ?RUE: Unable to fully assess due to pain ?  ?Lower Extremity Assessment ?Lower Extremity Assessment: Generalized weakness;Overall [facility] for tasks assessed ?  ?Cervical / Trunk Assessment ?Cervical / Trunk Assessment: Normal ?  ? ?Vision Baseline Vision/History: 1 Wears glasses ?Patient Visual Report: No change from baseline ?Vision Assessment?: No apparent visual deficits ?  ?Perception   ?  ?Praxis   ?  ? ?Cognition Arousal/Alertness: Awake/alert ?Behavior During Therapy: [facility] for tasks assessed/performed, Flat affect ?Overall Cognitive Status: Within Functional Limits for tasks assessed ?  ?  ?  ?General Comments: A & O x4, pt reports feeling much better than this morning. Quick to respond, RN had recently completed orthostatic vitals prior to this OT visit ?  ?  ?   ?Exercises Hand Exercises ?Forearm Supination: AROM, 5 reps, Seated, Right ?Forearm Pronation: AROM, 5 reps, Seated, Right ?Wrist Flexion: AROM, Right, 5 reps, Seated ?Wrist Extension: AROM, 5 reps, Right, Seated ?Digit Composite Flexion: AROM, Right, 5 reps, Seated ?Composite Extension: AROM, Right, 5 reps, Seated ?Other Exercises ?Other Exercises: Active elbow flexion and extension R UE ? ?  ?Shoulder Instructions Shoulder Instructions ?Donning/doffing shirt without moving shoulder: Minimal assistance;Caregiver independent with task ?Method for sponge bathing under operated UE: Set-up;Caregiver independent with task ?Donning/doffing sling/immobilizer: Minimal assistance;Caregiver independent with task ?Correct positioning of  sling/immobilizer: Min-guard;Caregiver independent with task ?ROM for elbow, wrist and digits  of operated UE: Caregiver independent with task;Supervision/safety ?Sling wearing schedule (on at all times/off for ADL's): M

## 2022-03-07 NOTE — Op Note (Signed)
NAME: [place], Clent R. ?MEDICAL RECORD NO: [id number] ?ACCOUNT NO: [id number] ?DATE OF BIRTH: 06/16/1951 ?FACILITY: [facility] ?LOCATION: [facility] ?PHYSICIAN: Gregory S. Dean, MD ? ?Operative Report  ? ?DATE OF PROCEDURE: 03/06/2022 ? ?PREOPERATIVE DIAGNOSIS:  Right humeral shaft fracture, pathologic with nonunion. ? ?POSTOPERATIVE DIAGNOSIS:  Right humeral shaft fracture, pathologic with nonunion. ? ?PROCEDURE:  Right humeral shaft fracture intramedullary nailing. ? ?SURGEON:  Gregory S. Dean, MD ? ?ASSISTANT:  Luke Magnant, PA ? ?INDICATIONS:  The patient is a 70-year-old patient with recent diagnosis of multiple myeloma, who fell approximately 3-1/2 weeks ago and sustained a midshaft humerus fracture.  He presents now for operative management of the fracture, which has show no  ?signs of healing despite conservative treatment.  The patient understands the risks and benefits. ? ?DESCRIPTION OF PROCEDURE:  The patient was brought to the operating room where general anesthetic was induced.  Preoperative antibiotics administered.  Timeout was called.  Right shoulder, arm and hand prescrubbed with alcohol and Betadine, prepped with  ?ChloraPrep solution and draped in sterile manner.  Ioban used to cover the entire operative field.  Timeout was called.  Incision made off the anterolateral margin of the acromion. Skin and subcutaneous tissue sharply divided. Raphae between the medial  ?and lateral deltoid was split, measured distance of 4 cm marked with #1 Vicryl suture.  At this time, under fluoroscopic guidance, entry point was obtained about 10 mm posterior to the biceps tendon at the intersection of the humeral head articular  ?surface and greater tuberosity.  Tendon was split longitudinally at this area with a fresh 15 blade and the guide pin was placed.  Fracture was reduced.  Guide pin placed across the fracture.  Proximal reaming performed.  Reaming was then performed up to ? 11 mm across the reduced fracture.  The patient  was 6 feet 3 inches and required the largest nail that was made, which was 28 cm x 10 mm.  The nail was then placed across the fracture with good reduction achieved.  Correct rotational alignment was  ?confirmed.  Next, 3 proximal interlocking screws were placed.  These were actually locked into the bone.  Proximal bone quality was poor.  Two distal interlocking screws were then placed in freehand technique after confirming correct rotational  ?alignment.  Next, thorough irrigation of all incisions was performed.  The split in the rotator cuff was closed using 0 FiberWire suture.  Thorough irrigation was again performed in both incisions.  Deltoid split closed using #1 Vicryl suture followed by ? interrupted inverted 2-0 Vicryl suture and 3-0 Monocryl. Distal incision was closed using 0 Vicryl suture, 2-0 Vicryl suture, and 3-0 Monocryl with Steri-Strips.  Impervious dressings applied.  The patient tolerated the procedure well without immediate  ?complications, transferred to the recovery room in stable condition. ? ? ?VAI ?D: 03/06/2022 7:49:04 pm T: 03/07/2022 2:49:00 am  ?JOB: [id number]/ [id number]  ?

## 2022-03-07 NOTE — Progress Notes (Signed)
PT Cancellation Note ? ?Patient Details ?Name: Lance Pham ?MRN: [id number] ?DOB: 1951-03-03 ? ? ?Cancelled Treatment:    Reason Eval/Treat Not Completed: Medical issues which prohibited therapy - patient with symptomatic orthostatic hypotension during Occupational Therapy session (down to 85/51, MD aware). Will follow-up for PT Evaluation when vital signs stable, as schedule permits. Noted plans for d/c today. ? ?Mabeline Caras, PT, DPT ?Acute Rehabilitation Services  ?Pager [phone] ?Office [phone] ? ?Derry Lory ?03/07/2022, 8:57 AM ?

## 2022-03-07 NOTE — Progress Notes (Signed)
PT Cancellation Note ? ?Patient Details ?Name: Lance Pham ?MRN: [id number] ?DOB: 06-08-1951 ? ? ?Cancelled Treatment:    Reason Eval/Treat Not Completed: PT screened, no needs identified per discussion with OT, will sign off. Please see Occupational Therapy Evaluation note for further details. ? ?Mabeline Caras, PT, DPT ?Acute Rehabilitation Services  ?Pager [phone] ?Office [phone] ? ?Derry Lory ?03/07/2022, 11:48 AM ?

## 2022-03-07 NOTE — Assessment & Plan Note (Signed)
-  Appears to be stable, possible slightly higher than usual baseline ?-Push PO fluids ?

## 2022-03-09 LAB — TYPE AND SCREEN
ABO/RH(D): O NEG
Antibody Screen: NEGATIVE
Unit division: 0
Unit division: 0

## 2022-03-09 LAB — BPAM RBC
Blood Product Expiration Date: 202305172359
Blood Product Expiration Date: 202305182359
ISSUE DATE / TIME: 202304270855
ISSUE DATE / TIME: 202304271937
Unit Type and Rh: 9500
Unit Type and Rh: 9500

## 2022-03-10 ENCOUNTER — Encounter (HOSPITAL_COMMUNITY): Payer: Self-pay | Admitting: Oncology

## 2022-03-10 ENCOUNTER — Telehealth: Payer: Self-pay

## 2022-03-10 ENCOUNTER — Other Ambulatory Visit: Payer: Self-pay | Admitting: Oncology

## 2022-03-10 ENCOUNTER — Inpatient Hospital Stay (INDEPENDENT_AMBULATORY_CARE_PROVIDER_SITE_OTHER): Payer: PPO | Admitting: Oncology

## 2022-03-10 ENCOUNTER — Inpatient Hospital Stay: Payer: PPO

## 2022-03-10 VITALS — BP 168/70 | HR 60 | Resp 18

## 2022-03-10 DIAGNOSIS — C9000 Multiple myeloma not having achieved remission: Secondary | ICD-10-CM

## 2022-03-10 DIAGNOSIS — F419 Anxiety disorder, unspecified: Secondary | ICD-10-CM | POA: Insufficient documentation

## 2022-03-10 DIAGNOSIS — C9110 Chronic lymphocytic leukemia of B-cell type not having achieved remission: Secondary | ICD-10-CM | POA: Insufficient documentation

## 2022-03-10 LAB — URINALYSIS, COMPLETE (UACMP) WITH MICROSCOPIC
Bacteria, UA: NONE SEEN
Bilirubin Urine: NEGATIVE
Glucose, UA: NEGATIVE mg/dL
Hgb urine dipstick: NEGATIVE
Ketones, ur: NEGATIVE mg/dL
Leukocytes,Ua: NEGATIVE
Nitrite: POSITIVE — AB
Protein, ur: 30 mg/dL — AB
Specific Gravity, Urine: 1.006 (ref 1.005–1.030)
pH: 8 (ref 5.0–8.0)

## 2022-03-10 MED ORDER — LORAZEPAM 1 MG PO TABS: 1.0000 mg | ORAL_TABLET | ORAL | 0 refills | Status: AC | PRN

## 2022-03-10 NOTE — Telephone Encounter (Signed)
I have attempted to return call to get more information x 3, no success. ?

## 2022-03-10 NOTE — Telephone Encounter (Signed)
I spoke with wife and she advised that pt has been up off and on all night with frequent urination.  She says he is still taking flomax but not detrol.  Per Dr. Hinton Rao, pt needs to speak with MD that is prescribing his bladder meds.  Spouse understood and was in agreement with this recommendation. ?

## 2022-03-10 NOTE — Telephone Encounter (Signed)
Spouse also states that patient looks terrible, is very gray in color, only moving from bed to chair, he is so given out that he cannot go to the bathroom alone, is having to use urinal.  Dr. Hinton Rao is bringing patient in for labs. ?

## 2022-03-11 ENCOUNTER — Telehealth: Payer: Self-pay | Admitting: Pharmacy Technician

## 2022-03-11 ENCOUNTER — Encounter: Payer: Self-pay | Admitting: Oncology

## 2022-03-11 ENCOUNTER — Other Ambulatory Visit (HOSPITAL_COMMUNITY): Payer: Self-pay

## 2022-03-11 ENCOUNTER — Encounter (HOSPITAL_COMMUNITY): Payer: Self-pay | Admitting: Orthopedic Surgery

## 2022-03-11 LAB — URINE CULTURE: Culture: NO GROWTH

## 2022-03-11 LAB — SURGICAL PATHOLOGY

## 2022-03-11 NOTE — Telephone Encounter (Signed)
Oral Oncology Patient Advocate Encounter ? ?Was successful in securing patient a $12,000 grant from [employer] to provide copayment coverage for Lenalidomide (Revlimid).  This will keep the out of pocket expense at $0.   ?  ?[employer] ID: [id number] ? ?I have spoken with the patient. ?  ?The billing information is as follows and has been shared with [employer].  ?  ?RxBin: [id number] ?PCN: [id number] ?Member ID: [id number] ?Group ID: [id number] ?Dates of Eligibility: 02/09/2022 through 02/09/2023 ? ?Fund: Multiple Myeloma ? ? ?Lady Deutscher, CPhT-Adv ?Pharmacy Patient Advocate Specialist ?[place] Patient Advocate Team ?Direct Number: [phone]  Fax: [phone] ?

## 2022-03-11 NOTE — Telephone Encounter (Signed)
Patient Advocate Encounter ? ?Prior Authorization for Lenalidomide '25MG'$  capsule has been approved.   ? ?PA# [id number] ?Effective dates: 03/11/2022 through 03/11/2023 ? ?Test claim at [facility] shows co-pay as [phone]. May vary at other pharmacies. ? ? ?Patient Advocate Encounter ?  ?Received notification from [facility] ([facility]) that prior authorization for Lenalidomide '25MG'$  capsule is required. ?  ?PA submitted on 03/11/2022 ?Key [id number] ?Status is pending ?   ? ?Lady Deutscher, CPhT-Adv ?Pharmacy Patient Advocate Specialist ?[place] Patient Advocate Team ?Direct Number: [phone]  Fax: [phone] ? ?

## 2022-03-12 ENCOUNTER — Encounter: Payer: Self-pay | Admitting: Oncology

## 2022-03-12 ENCOUNTER — Other Ambulatory Visit: Payer: Self-pay | Admitting: Oncology

## 2022-03-12 ENCOUNTER — Other Ambulatory Visit: Payer: Self-pay

## 2022-03-12 NOTE — Telephone Encounter (Signed)
Dr Hinton Rao states, "tell her it was all clear".  I called pt's wife and told her that urine screen was clear. I also asked how he was feeling today. She states, "He is feeling much better. He slept good last night. Thank y'all so much". ?

## 2022-03-13 ENCOUNTER — Telehealth: Payer: Self-pay | Admitting: Dietician

## 2022-03-13 NOTE — Telephone Encounter (Signed)
Called home# and was told it was the wrong number. ?

## 2022-03-13 NOTE — Telephone Encounter (Signed)
Patient screened on MST. First attempt to reach. Called patient at cell#.  He answered but stated that all calls should be directed towards his wife as she is his advocate.  He proved wife's email. [email or web address] ?Sent email to his wife to introduce myself and encourae her to set up appointment when all of [place] are available. Provided my office# to set up a nutrition consult. ? ?April Manson, RDN, LDN ?Registered Dietitian, [place] ?Part Time Remote (Usual office hours: Tuesday-Thursday) ?Remote office: [phone]  ?

## 2022-03-17 ENCOUNTER — Ambulatory Visit (INDEPENDENT_AMBULATORY_CARE_PROVIDER_SITE_OTHER): Payer: PPO

## 2022-03-17 ENCOUNTER — Encounter: Payer: Self-pay | Admitting: Orthopedic Surgery

## 2022-03-17 ENCOUNTER — Other Ambulatory Visit: Payer: Self-pay | Admitting: Oncology

## 2022-03-17 ENCOUNTER — Telehealth: Payer: Self-pay

## 2022-03-17 ENCOUNTER — Ambulatory Visit (INDEPENDENT_AMBULATORY_CARE_PROVIDER_SITE_OTHER): Payer: PPO | Admitting: Orthopedic Surgery

## 2022-03-17 ENCOUNTER — Telehealth: Payer: Self-pay | Admitting: Orthopedic Surgery

## 2022-03-17 DIAGNOSIS — M533 Sacrococcygeal disorders, not elsewhere classified: Secondary | ICD-10-CM | POA: Diagnosis not present

## 2022-03-17 DIAGNOSIS — C9000 Multiple myeloma not having achieved remission: Secondary | ICD-10-CM

## 2022-03-17 DIAGNOSIS — S4991XA Unspecified injury of right shoulder and upper arm, initial encounter: Secondary | ICD-10-CM

## 2022-03-17 NOTE — Telephone Encounter (Signed)
I called Juliann Pulse, pt's wife back and told her providers concur that he needs to be seen in the emergency room. She said ok. ? ?Melissa,NP: Hopefully, orthopedics will advise ED as well.  ? ?Dr Hinton Rao: I agree he needs to go to the ER, he has an appt here sometime this week and I may need to discuss palliative care. well I see he doesn't have a f/u appt. here.  When she told me he had a f/u Monday 5/15, I thought she meant here. If he doesnt go to the ER or get admitted, I need to see him.  I need to have further discussion with them & see if he needs blood. 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 I will order labs ? ? ? ?Pt's wife has called req our advice. She states Dana is still very weak. He cant get to the bathroom. He fell this morning @ 4am - flat of his back. She is taking him to see an orthopedist @ 3pm.  I told her I really thought he need to be seen in the ER for a good evaluation but that I would send this note to providers. ?

## 2022-03-17 NOTE — Telephone Encounter (Signed)
Pt's wife called to please make sure pt's oxycodone is sent to [facility] on [address] In [place] ?

## 2022-03-17 NOTE — Progress Notes (Signed)
I instructed Lance Pham to call in the morning to schedule F/U or labs if needed.  She was happy with this since they really just wanted to go home. ? ?

## 2022-03-18 ENCOUNTER — Inpatient Hospital Stay: Payer: PPO

## 2022-03-18 ENCOUNTER — Inpatient Hospital Stay: Payer: PPO | Admitting: Oncology

## 2022-03-18 ENCOUNTER — Other Ambulatory Visit: Payer: Self-pay | Admitting: Hematology and Oncology

## 2022-03-18 ENCOUNTER — Other Ambulatory Visit: Payer: Self-pay | Admitting: Oncology

## 2022-03-18 VITALS — BP 126/73 | HR 71 | Temp 98.2°F | Resp 18 | Wt 253.4 lb

## 2022-03-18 DIAGNOSIS — C9000 Multiple myeloma not having achieved remission: Secondary | ICD-10-CM

## 2022-03-18 DIAGNOSIS — C9110 Chronic lymphocytic leukemia of B-cell type not having achieved remission: Secondary | ICD-10-CM | POA: Diagnosis not present

## 2022-03-18 LAB — CBC AND DIFFERENTIAL
HCT: 26 — AB (ref 41–53)
Hemoglobin: 8.3 — AB (ref 13.5–17.5)
Neutrophils Absolute: 4.79
Platelets: 196 10*3/uL (ref 150–400)
WBC: 13.3

## 2022-03-18 LAB — HEPATIC FUNCTION PANEL
ALT: 58 U/L — AB (ref 10–40)
AST: 151 — AB (ref 14–40)
Alkaline Phosphatase: 182 — AB (ref 25–125)
Bilirubin, Total: 0.7

## 2022-03-18 LAB — BASIC METABOLIC PANEL
BUN: 21 (ref 4–21)
CO2: 29 — AB (ref 13–22)
Chloride: 101 (ref 99–108)
Creatinine: 2.2 — AB (ref 0.6–1.3)
Glucose: 113
Potassium: 4.4 mEq/L (ref 3.5–5.1)
Sodium: 130 — AB (ref 137–147)

## 2022-03-18 LAB — CBC: RBC: 2.68 — AB (ref 3.87–5.11)

## 2022-03-18 LAB — COMPREHENSIVE METABOLIC PANEL
Albumin: 3.2 — AB (ref 3.5–5.0)
Calcium: 11.3 — AB (ref 8.7–10.7)

## 2022-03-18 MED ORDER — ONDANSETRON HCL 8 MG PO TABS: 8.0000 mg | ORAL_TABLET | Freq: Every day | ORAL | 0 refills | Status: AC | PRN

## 2022-03-18 MED ORDER — DEXAMETHASONE 4 MG PO TABS: 20.0000 mg | ORAL_TABLET | ORAL | 3 refills | Status: AC

## 2022-03-18 MED ORDER — OXYCODONE HCL 5 MG PO TABS: 5.0000 mg | ORAL_TABLET | ORAL | 0 refills | Status: AC | PRN

## 2022-03-18 NOTE — Telephone Encounter (Signed)
Looks like it was sent in today by someone else, just saw this message

## 2022-03-18 NOTE — Progress Notes (Signed)
? ?Post-Op Visit Note ?  ?Patient: PAXTEN APPELT           ?Date of Birth: 1951/02/06           ?MRN: [id number] ?Visit Date: 03/17/2022 ?PCP: Bonnita Nasuti, MD ? ? ?Assessment & Plan: ? ?Chief Complaint:  ?Chief Complaint  ?Patient presents with  ? Right Arm - Routine Post Op  ? ?Visit Diagnoses:  ?1. Arm injury, right, initial encounter   ?2. Coccyx pain   ? ? ?Plan: Que is a 71 year old patient with right shoulder pain.  [place] last night and landed on his sacral region.  Still is having pain in the shoulder and arm.  Has been in the sling.  On examination incision intact proximally and distally and the biceps and deltoid fire.  Rotation looks good.  Motor sensory function to the hand is intact including EPL FPL interosseous function.  Radiographs show no change in hardware alignment.  I do not think the fall has really injured the fracture construct.  Plan is elbow range of motion but no lifting with that right arm.  Come back in 3 weeks for clinical recheck and repeat radiographs. ? ?Follow-Up Instructions: Return in about 3 weeks (around 04/07/2022).  ? ?Orders:  ?Orders Placed This Encounter  ?Procedures  ? XR Humerus Right  ? XR Sacrum/Coccyx  ? ?No orders of the defined types were placed in this encounter. ? ? ?Imaging: ?XR Sacrum/Coccyx ? ?Result Date: 03/18/2022 ?AP lateral radiographs sacrum and coccyx were reviewed.  No acute fracture.  Bony contours intact.  No compression fractures or deformity present ? ?XR Humerus Right ? ?Result Date: 03/18/2022 ?Multiple views right humerus reviewed.  Intramedullary nail transfixes pathologic humeral shaft fracture in the midshaft region.  No new fracture is present.  Hardware positioning unchanged from immediate postop and intraoperative images.  ? ?PMFS History: ?Patient Active Problem List  ? Diagnosis Date Noted  ? Anxiety in cancer patient 03/10/2022  ?  Class: Chronic  ? Hyponatremia 03/07/2022  ? Chronic kidney disease, stage 3a ([place]) 03/07/2022  ?  Orthostatic hypotension 03/07/2022  ? Fracture of humeral shaft, closed 03/06/2022  ? Multiple myeloma ([place]) 02/25/2022  ? Symptomatic anemia 02/09/2022  ? Chronic diastolic CHF (congestive heart failure), NYHA class 3 ([place]) 12/13/2021  ? History of CVA (cerebrovascular accident) 12/13/2021  ? POTS (postural orthostatic tachycardia syndrome) 12/13/2021  ? COVID-19   ? Sleep apnea   ? PONV (postoperative nausea and vomiting)   ? Hypertension   ? BBB (bundle branch block)   ? Cataract   ? Clotting disorder ([place])   ? Arthritis   ? A-fib ([place])   ? Acute intermittent porphyria ([place]) 12/04/2020  ? Acute upper respiratory infection 12/04/2020  ? Allergic rhinitis 12/04/2020  ? Benign prostatic hyperplasia with lower urinary tract symptoms 12/04/2020  ? Constipation 12/04/2020  ? 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[place]) 12/04/2020  ? Cutaneous abscess 12/04/2020  ? Gastro-esophageal reflux disease without esophagitis 12/04/2020  ? Generalized anxiety disorder 12/04/2020  ? Hyperglycemia due to type 2 diabetes mellitus ([place]) 12/04/2020  ? Hypothyroidism 12/04/2020  ? Iron deficiency 12/04/2020  ? 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 Mixed hyperlipidemia 12/04/2020  ? 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 Myopathy 12/04/2020  ? Other long term (current) drug therapy 12/04/2020  ? Other vitamin B12 deficiency anemias 12/04/2020  ? Overactive bladder 12/04/2020  ? Peripheral vascular disease ([place]) 12/04/2020  ? Recurrent major depression in full remission ([place]) 12/04/2020  ? Testicular hypofunction  12/04/2020  ? Vitamin D deficiency 12/04/2020  ? Stroke (cerebrum) ([place]) 11/21/2020  ? Episodic lightheadedness 10/14/2019  ? 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 Palpitations 10/14/2019  ? RBBB (right bundle branch block) 10/14/2019  ? CAD in native artery 06/22/2019  ? Paroxysmal atrial fibrillation ([place]) 06/22/2019  ? Shortness of breath 06/08/2019  ? Abnormal myocardial perfusion study 07/14/2018  ? Chest  discomfort 07/14/2018  ? Dyslipidemia 07/14/2018  ? Essential hypertension 07/14/2018  ? 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 ?Past Medical History:  ?Diagnosis Date  ? Acute intermittent porphyria ([place]) 12/04/2020  ? Allergic rhinitis 12/04/2020  ? Arthritis   ? RIGHT knee  ? Benign prostatic hyperplasia with lower urinary tract symptoms 12/04/2020  ? Binge eating disorder 12/04/2020  ? CAD in native artery 06/22/2019  ? Clotting disorder ([place])   ? has a genetic clotting dx -unknown name  ? Cutaneous abscess 12/04/2020  ? Dyslipidemia 07/14/2018  ? Essential hypertension 07/14/2018  ? Gastro-esophageal reflux disease without esophagitis 12/04/2020  ? Generalized anxiety disorder 12/04/2020  ? 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 Hypothyroidism 12/04/2020  ? 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 Multiple myeloma ([place])   ? Orthostatic hypotension   ? Other vitamin B12 deficiency anemias 12/04/2020  ? Overactive bladder 12/04/2020  ? Paroxysmal atrial fibrillation ([place]) 06/22/2019  ? Peripheral vascular disease ([place]) 12/04/2020  ? 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 Formatting of this note might be different from the original. 3 mm pituitary microadenoma. Likely nonfunctional.  ? PONV (postoperative nausea and vomiting)   ? RBBB (right bundle branch block) 10/14/2019  ? Sleep apnea   ? use CPAP  ? Stroke (cerebrum) ([place]) 11/21/2020  ? Vitamin D deficiency 12/04/2020  ?  ?Family History  ?Problem Relation Age of Onset  ? Colon cancer Mother 14  ? Colon polyps Mother 72  ? Thyroid cancer Father 31  ?     parathyroid CA  ? Colon polyps Sister   ? Colon polyps Brother   ? Colon polyps Sister   ? Colon polyps Sister   ? Colon polyps Sister   ? Esophageal cancer Neg Hx   ? Stomach cancer Neg Hx   ? Rectal cancer Neg Hx   ?  ?Past Surgical History:  ?Procedure Laterality Date  ? CATARACT EXTRACTION, BILATERAL    ?  CHOLECYSTECTOMY    ? COLONOSCOPY  2017  ? RG-mag citrtae (good)-TICS/TA x 1  ? FOOT SURGERY Left   ? tendon link  ? HAMMER TOE SURGERY    ? HUMERUS IM NAIL Right 03/06/2022  ? Procedure: RIGHT INTRAMEDULLARY (IM) NAIL HUMERAL;  Surgeon: Meredith Pel, MD;  Location: [facility];  Service: Orthopedics;  Laterality: Right;  ? KNEE SURGERY Right   ? LAPAROSCOPIC GASTRIC BANDING    ? POLYPECTOMY  2017  ? TA x 1  ? WISDOM TOOTH EXTRACTION    ? WRIST RECONSTRUCTION Right   ? ?Social History  ? ?Occupational History  ? Not on file  ?Tobacco Use  ? Smoking status: Never  ? Smokeless tobacco: Never  ?Vaping Use  ? Vaping Use: Never used  ?Substance and Sexual Activity  ? Alcohol use: Never  ? Drug use: Never  ? Sexual activity: Not Currently  ? ? ? ?

## 2022-03-19 ENCOUNTER — Telehealth: Payer: Self-pay

## 2022-03-19 NOTE — Telephone Encounter (Signed)
Oral Oncology Pharmacist Encounter ? ?Patient is currently not starting on full multiple myeloma regimen and is starting on dexamethasone. Will re-open encounter upon addition of other oral medications. ? ?Drema Halon, PharmD ?Hematology/Oncology Clinical Pharmacist ?[facility] ?[phone] ? ?

## 2022-03-19 NOTE — Telephone Encounter (Addendum)
5/18 - I spoke with [place]. He states he is much better today.   RE: Still panicked per wife  Received: Today [name], MD  Dairl Ponder, RN Can you f/u and see how they are doing today?    03/19/2022 - I spoke with Juliann Pulse. She definitely isn't interested in Hospice yet. I told her we aren't aware of anyone who offers respite care, other than Hospice. She states, "He is still having the panic attack". I asked how much lorazepam she had given him today. She replied "2". She will give him 1/2 the dexamethasone pill tomorrow as recommended. I sent the above message back to Dr Hinton Rao.    RE: Req help in home Received: Today [name], MD  Dairl Ponder, RN I don't know where you can get respite care except for hospice.  Is she saying they want to go that route?  We talked about it and I started him on Dexamethasone '4mg'$  daily, which he could continue if he is on hospice, but we can't otherwise treat aggressively or transfuse if on hospice.  I think the Dex may have added to his anxiety, let's try 1/2 pill daily. I did speak with EMS while they were there and we decided he did not need to go to the hospital, all VS good except tachycardia.     Pt's wife, Juliann Pulse, has called and states she needs respite care or whatever you think he needs in the home. He has been having a panic attack right now over an hour. She had given him another lorazepam. He is still in pain also. She is going to give him another oxycodone. She has only been giving him one at a time. They called EMS because he felt like he was having a heart attack. She states, "She will know what to do we talked about this yesterday". I sent this message to Dr Hinton Rao @ [id number]

## 2022-03-20 LAB — PROTEIN ELECTROPHORESIS, SERUM

## 2022-03-21 ENCOUNTER — Other Ambulatory Visit: Payer: Self-pay

## 2022-03-21 ENCOUNTER — Other Ambulatory Visit: Payer: Self-pay | Admitting: Oncology

## 2022-03-21 NOTE — Progress Notes (Signed)
[place]  [address] [place],  [place]  [address] [phone]  Clinic Day: Mar 05, 2022  Referring physician: Bonnita Nasuti, MD   ASSESSMENT & PLAN:   Multiple myeloma This is an IgG kappa but appears quite aggressive with an IgG level increased to 7797 on April 26 an M spike of 6.2.  I am very concerned that his IgG level was over 4 g last month at [facility] and now is nearly 8 g, so he is progressing rapidly.  The beta-2 microglobulin is 10.9 and he has 70% involvement of the bone marrow.  His prognosis is quite guarded but this disease usually does respond to treatment.  As a matter of what he could tolerate and I have recommended DRd, that is, injections of daratumumab weekly with oral Revlimid daily at bedtime and dexamethasone and weekly pulses.  Severe anemia  He has required at least 3 transfusions now.  Iron studies in the lab were normal but iron stores in the bone marrow are low.  B12 was low normal at 270 and folate was normal.  Retake count was 1.8% and he had a normal MMA and normal copper level.  I feel it is primarily caused by his multiple myeloma.  Renal insufficiency This is mild to moderate with an EGFR estimated at 46.  He has mild proteinuria with a small M spike in the urine.  Chronic orthostatic hypotension He has had this for years due to autonomic dysfunction.  He has been managed by the cardiologist with midodrine and they did try Florinef but that has been stopped.  He will require some steroids as part of his myeloma regimen and hopefully this will help.  He did have a near syncopal episode here in the office today.  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  I will check a flow cytometry to confirm this.  Hypercalcemia This is mild but I expect it will increase and so I described the symptoms to expect.  He will need  to receive zoledronic acid or denosumab.  Severe anxiety and panic attacks Dr. Luan Pulling prescribed alprazolam 0.5 mg but he and his wife say that has not helped, so I will prescribe lorazepam 1 mg to use every 4 hours as needed.  He does appear quite anxious today.  He is already on Prozac and bupropion.  Mild hyponatremia This appears to be chronic.  There was much to discuss regarding his diagnosis, prognosis and treatment plan.  I went through as much information as they could tolerate and discussed the reasons that I do not feel he is a transplant candidate.  We will get flow cytometry for CLL and monitor his blood counts.  I expect he will need transfusions again.  He will have a rod placed in his humerus tomorrow and so we will need to let the surgery heal at least somewhat prior to starting treatment.  He will need allopurinol and acyclovir when he starts treatment as well as close follow-up, probably weekly with labs weekly. I discussed the assessment and treatment plan with the patient.  The patient was provided an opportunity to ask questions and all were answered.  The patient agreed with the plan and demonstrated an understanding of the instructions.  The patient was advised to call back if the symptoms worsen or if the condition fails to improve as anticipated.  I  provided 45 minutes of face-to-face time during this this encounter and > 50% was spent counseling as documented under my assessment and plan.    [name], MD [facility] [address] [place] [address] Dept: [phone] Dept Fax: [phone]    CHIEF COMPLAINT:  CC: Multiple myeloma  Current Treatment: Evaluation for chemotherapy   HISTORY OF PRESENT ILLNESS:  Lance Pham is a 71 y.o. male with a history of severe orthostatic hypotension due to autonomic dysfunction who is referred in consultation with Dr. Shirlee More for  assessment and management.  He was hospitalized at [facility] on April 9 and found to have a fracture of the humerus which appeared to be pathologic in nature.  Skeletal survey did not reveal any other lytic lesions but he was found to have elevated protein, hypercalcemia, renal dysfunction, and anemia.  CT scan of chest abdomen and pelvis revealed tiny pulmonary nodules of the left upper lobe and a 14 mm nodule of the left adrenal gland which was stable.  His hemoglobin dropped as low as 7.8 and he was transfused with 1 unit of packed red blood cells.  He showed up to Dr. Joya Gaskins office on April 25 with severe hypotension, with a systolic blood pressure of 80 and extreme weakness.  He was sent to the emergency room and found to be severely anemic once again with a hemoglobin of 6.6 and transfused with another unit of packed red blood cells.  He continues to feel very weak and has severe pain of the right shoulder which is in an immobilization sling.  While in [facility], he was found to have a total protein up to 10.6 with a calcium of 9.9, creatinine 1.5 and a white count of 12.4 with 79% lymphocytes.  A bone marrow was performed while he was in the hospital and he is here now with his wife for those results.  During his prior hospitalization at [facility], urine protein electrophoresis revealed 840 mg of protein over 24 hours with 48% of that consistent with Bence-Jones proteins for an M spike in the urine of 400 mg.  He did have evaluation of his anemia and iron studies were normal folate was normal B12 was low normal at 270.  INTERVAL HISTORY:  I have reviewed his chart and materials related to his cancer extensively and collaborated history with the patient. Summary of oncologic history is as follows: Oncology History   No history exists.   Lance Pham is seen in the clinic for follow up of his newly diagnosed multiple myeloma, IgG kappa.  He still feels extremely weak and has no appetite.  He is  scheduled to have a rod placed in his right humerus tomorrow by orthopedic surgeon and we will have him proceed with that.  However I feel some urgency to start treatment for his multiple myeloma.  I reviewed the bone marrow results with him and there is 64 to 70% plasma cells in the bone marrow with kappa light chains.  However on flow cytometry there is a clonal population of lambda light chain positivity, and we suspect he may have a second malignancy, chronic lymphocytic leukemia perhaps.  I will order a flow cytometry on his peripheral blood.  Storage iron is diminished to absent by iron stains.  He has been placed on midodrine and increased from 5 mg to 10 mg 3 times daily.  He has a performance status of 2 and  spends much of his time in bed or in the recliner.  Today's labs reveal a hemoglobin of 8.7, normal platelet count of 147,000, MCV of 96.  His white count is elevated at 12.7 with 75% lymphocytes.  Comprehensive metabolic profile reveals a creatinine of 1.5, calcium of 11, and total protein now increased to 12.7.  He continues to have elevation of his SGOT, increased from 1 11-1 25 now.  He has mild hyponatremia with a sodium of 134.  He does tell me he has had shingles on 3 different occasions and so he will require allopurinol and acyclovir if and when he is placed on chemotherapy.  I do not think he is a bone marrow candidate based on his comorbidities, age and performance status.  We discussed whether we would consider a 4 drug regimen and I do not think he is up-to-date either.  Therefore he would be a candidate for a 3 drug regimen of Revlimid, dexamethasone, and either daratumumab or Velcade.  We would do that for 8-12 cycles and then put him on drug maintenance.  I have reviewed information about the diagnosis and treatment today.  I do expect he will need periodic transfusions due to the high percentage of myeloma infiltration of his bone marrow. He denies fever, chills, night sweats, or other  signs of infection. He denies cardiorespiratory and gastrointestinal issues. He  denies pain. His appetite is poor and he is losing weight.  He has severe fatigue, weakness and dizziness. HISTORY:   Past Medical History:  Diagnosis Date   Acute intermittent porphyria ([place]) 12/04/2020   Allergic rhinitis 12/04/2020   Arthritis    RIGHT knee   Benign prostatic hyperplasia with lower urinary tract symptoms 12/04/2020   Binge eating disorder 12/04/2020   CAD in native artery 06/22/2019   Clotting disorder ([place])    has a genetic clotting dx -unknown name   Cutaneous abscess 12/04/2020   Dyslipidemia 07/14/2018   Essential hypertension 07/14/2018   Gastro-esophageal reflux disease without esophagitis 12/04/2020   Generalized anxiety disorder 12/04/2020   Hyperprolactinemia ([place]) 06/02/2017   Hypothyroidism 12/04/2020   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 3 mm pituitary microadenoma. Likely nonfunctional.   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   Vitamin D deficiency 12/04/2020  Anxiety and panic attacks Pathologic fracture of the right humerus History of COVID-19 infection last year Osteoarthritis 9  Past Surgical History:  Procedure Laterality Date   CATARACT EXTRACTION, BILATERAL     CHOLECYSTECTOMY     COLONOSCOPY  2017   RG-mag citrtae (good)-TICS/TA x 1   FOOT SURGERY Left    tendon link   HAMMER TOE SURGERY     HUMERUS IM NAIL Right 03/06/2022   Procedure: RIGHT  INTRAMEDULLARY (IM) NAIL HUMERAL;  Surgeon: Meredith Pel, MD;  Location: [facility];  Service: Orthopedics;  Laterality: Right;   KNEE SURGERY Right    LAPAROSCOPIC GASTRIC BANDING     POLYPECTOMY  2017   TA x 1   WISDOM TOOTH  EXTRACTION     WRIST RECONSTRUCTION Right     Family History  Problem Relation Age of Onset   Colon cancer Mother 89   Colon polyps Mother 41   Thyroid cancer Father 31       parathyroid CA   Colon polyps Sister    Colon polyps Brother    Colon polyps Sister    Colon polyps Sister    Colon polyps Sister    Esophageal cancer Neg Hx    Stomach cancer Neg Hx    Rectal cancer Neg Hx     Social History:  reports that he has never smoked. He has never used smokeless tobacco. He reports that he does not drink alcohol and does not use drugs.The patient is married and accompanied by his wife today.  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  Allergies:  Allergies  Allergen Reactions   Nsaids Other (See Comments)    Patient had a Lap band placed is suppose to have NO NSAIDS due to the possibility of esophageal erosion leading to band "coming through"   Tamiflu [Oseltamivir] Nausea Only and Other (See Comments)    Stomach upset Per patient never had a problem    Current Medications: Current Outpatient Medications  Medication Sig Dispense Refill   acetaminophen (TYLENOL) 500 MG tablet Take 1,000 mg by mouth every 6 (six) hours as needed for mild pain or headache.     apixaban (ELIQUIS) 5 MG TABS tablet Take 5 mg by mouth 2 (two) times daily.     benzonatate (TESSALON) 200 MG capsule Take 200 mg by mouth 3 (three) times daily as needed for cough.     buPROPion (WELLBUTRIN SR) 150 MG 12 hr tablet Take 150 mg by mouth at bedtime.     cyanocobalamin (,VITAMIN B-12,) 1000 MCG/ML injection Inject 1 mL (1,000 mcg total) into the muscle every 30 (thirty) days. 1 mL 0   dexamethasone (DECADRON) 4 MG tablet Take 5 tablets (20 mg total) by mouth once a week. 20 tablet  3   ferrous sulfate 324 (65 Fe) MG TBEC Take 324 mg by mouth daily after supper.     FLUoxetine (PROZAC) 40 MG capsule Take 40 mg by mouth at bedtime.     FOLIC ACID PO Take 027 mcg by mouth at bedtime.     LORazepam (ATIVAN) 1 MG tablet Take 1 tablet (1 mg total) by mouth every 4 (four) hours as needed for anxiety. 30 tablet 0   methocarbamol (ROBAXIN) 500 MG tablet Take 1 tablet (500 mg total) by mouth every 6 (six) hours as needed for muscle spasms. 30 tablet 0   midodrine (PROAMATINE) 10 MG tablet Take 10 mg by mouth 3 (three) times daily.     ondansetron (ZOFRAN) 8 MG tablet Take 1 tablet (8 mg total) by mouth daily as needed for nausea or vomiting. 90 tablet 0   oxyCODONE (OXY IR/ROXICODONE) 5 MG immediate release tablet Take 1-2 tablets (5-10 mg total) by mouth every 4 (four) hours as needed for moderate pain (pain score 4-6). 120 tablet 0   polyethylene glycol (MIRALAX / GLYCOLAX) 17 g packet Take 17 g by mouth daily as needed for mild constipation or moderate constipation. 14 each 0   pyridostigmine (MESTINON) 60 MG tablet Take 0.5 tablets (30 mg total) by mouth 2 (two) times daily. 30 tablet 0   senna-docusate (SENOKOT-S) 8.6-50 MG tablet Take 2 tablets by mouth at bedtime as needed for mild constipation or moderate  constipation. 10 tablet 0   tamsulosin (FLOMAX) 0.4 MG CAPS capsule Take 0.4 mg by mouth at bedtime.     No current facility-administered medications for this visit.    REVIEW OF SYSTEMS:  Review of Systems  Constitutional:  Positive for appetite change and fatigue.  HENT:  Negative.    Eyes: Negative.   Respiratory:  Positive for shortness of breath.   Cardiovascular:  Positive for leg swelling.  Gastrointestinal: Negative.   Genitourinary:  Positive for difficulty urinating, frequency and nocturia.   Musculoskeletal:  Positive for arthralgias and back pain.  Skin: Negative.   Neurological:  Positive for dizziness, extremity weakness and light-headedness.   Hematological:  Bruises/bleeds easily.  Psychiatric/Behavioral:  Positive for depression and sleep disturbance. The patient is nervous/anxious.      VITALS:  Blood pressure 129/62, pulse 63, temperature 98.1 F (36.7 C), temperature source Oral, resp. rate 20, SpO2 98 %.  Wt Readings from Last 3 Encounters:  03/18/22 253 lb 6.4 oz (114.9 kg)  03/06/22 261 lb (118.4 kg)  02/25/22 263 lb (119.3 kg)    There is no height or weight on file to calculate BMI.  Performance status (ECOG): 2 - Symptomatic, <50% confined to bed  PHYSICAL EXAM:  Physical Exam Vitals and nursing note reviewed.  Constitutional:      Appearance: He is ill-appearing.  HENT:     Head: Normocephalic and atraumatic.     Nose: Nose normal.     Mouth/Throat:     Pharynx: Oropharynx is clear.  Eyes:     Extraocular Movements: Extraocular movements intact.     Conjunctiva/sclera: Conjunctivae normal.     Pupils: Pupils are equal, round, and reactive to light.  Cardiovascular:     Rate and Rhythm: Normal rate and regular rhythm.     Heart sounds: Normal heart sounds.  Pulmonary:     Breath sounds: Normal breath sounds.  Abdominal:     General: Bowel sounds are normal.     Palpations: Abdomen is soft.  Genitourinary:    Penis: Normal.   Musculoskeletal:        General: Signs of injury present.     Cervical back: Normal range of motion.     Comments: Right arm is in a sling due to fracture of the upper humerus  Skin:    Coloration: Skin is pale.     Findings: Bruising present.  Neurological:     General: No focal deficit present.     Motor: Weakness present.  Psychiatric:     Comments: He appears anxious and restless.    LABS:      Latest Ref Rng & Units 03/18/2022   12:00 AM 03/07/2022    1:12 AM 03/06/2022   10:26 PM  CBC  WBC  13.3      17.3   16.0    Hemoglobin 13.5 - 17.5 8.3      8.2   8.4    Hematocrit 41 - 53 26      26.6   26.3    Platelets 150 - 400 K/uL 196      145   148       This  result is from an external source.      Latest Ref Rng & Units 03/18/2022   12:00 AM 03/07/2022    1:12 AM 03/06/2022   10:26 PM  CMP  Glucose 70 - 99 mg/dL  134   137    BUN 4 - 21 21  20   19    Creatinine 0.6 - 1.3 2.2      1.49   1.48    Sodium 137 - 147 130      127   129    Potassium 3.5 - 5.1 mEq/L 4.4      5.1   5.0    Chloride 99 - 108 101      101   101    CO2 13 - _0 Calcium 8.7 - 10.7 11.3      8.9   8.8    Alkaline Phos 25 - 125 182         AST 14 - 40 151         ALT 10 - 40 U/L 58            This result is from an external source.     No results found for: CEA1 / No results found for: CEA1 No results found for: PSA1 No results found for: CAN199 No results found for: CAN125  Lab Results  Component Value Date   TOTALPROTELP POSSIBLE INTERFERING SUBSTANCE 03/05/2022   ALBUMINELP NOT PERFORMED 03/05/2022   A1GS NOT PERFORMED 03/05/2022   A2GS NOT PERFORMED 03/05/2022   BETS NOT PERFORMED 03/05/2022   GAMS NOT PERFORMED 03/05/2022   MSPIKE 4.2 (H) 02/10/2022   SPEI Comment 02/10/2022   Lab Results  Component Value Date   TIBC 343 02/09/2022   FERRITIN 332 02/09/2022   FERRITIN 319 11/22/2020   FERRITIN 312 11/21/2020   IRONPCTSAT 36 02/09/2022   Lab Results  Component Value Date   LDH 279 (H) 11/21/2020    STUDIES:  DG Humerus Right  Result Date: 03/06/2022 CLINICAL DATA:  Postop EXAM: RIGHT HUMERUS - 2+ VIEW COMPARISON:  02/10/2022 bone survey FINDINGS: Interval intramedullary rod with proximal and distal screw fixation of right humerus for fracture involving midshaft of humerus, this is associated with lucent bone lesion and would be consistent with pathologic fracture. Anatomic alignment. Soft tissue calcifications versus linear osseous fragments at the site of fracture. IMPRESSION: Interval surgical fixation of pathologic mid humerus fracture with anatomic alignment. Electronically Signed   By: Donavan Foil M.D.   On: 03/06/2022  21:18   DG Humerus Right  Result Date: 03/06/2022 CLINICAL DATA:  Known right humeral fracture EXAM: RIGHT HUMERUS - 2+ VIEW COMPARISON:  02/28/2022 FLUOROSCOPY TIME:  Radiation Exposure Index (as provided by the fluoroscopic device): 5.52 mGy If the device does not provide the exposure index: Fluoroscopy Time:  1 minute 40 seconds Number of Acquired Images:  8 FINDINGS: The previously seen right humeral fractures again noted. Medullary rod is now seen within the humerus with proximal and distal fixation screws. There is again noted a overall lytic lesion at the level of fracture consistent with pathologic fracture. No other focal abnormality is seen. IMPRESSION: ORIF of right humeral fracture secondary to pathologic fracture. Electronically Signed   By: Inez Catalina M.D.   On: 03/06/2022 19:54   DG C-Arm 1-60 Min-No Report  Result Date: 03/06/2022 Fluoroscopy was utilized by the requesting physician.  No radiographic interpretation.   DG C-Arm 1-60 Min-No Report  Result Date: 03/06/2022 Fluoroscopy was utilized by the requesting physician.  No radiographic interpretation.   DG C-Arm 1-60 Min-No Report  Result Date: 03/06/2022 Fluoroscopy was utilized by the requesting physician.  No radiographic interpretation.   XR Sacrum/Coccyx  Result Date: 03/18/2022 AP  lateral radiographs sacrum and coccyx were reviewed.  No acute fracture.  Bony contours intact.  No compression fractures or deformity present  XR Humerus Right  Result Date: 03/18/2022 Multiple views right humerus reviewed.  Intramedullary nail transfixes pathologic humeral shaft fracture in the midshaft region.  No new fracture is present.  Hardware positioning unchanged from immediate postop and intraoperative images.  XR Humerus Right  Result Date: 02/28/2022 AP and lateral views of right humerus reviewed.  Midshaft humerus fracture again noted in good alignment with very little angulation.  Fracture seems slightly more distracted  than previous radiographs.  No callus formation.  XR Humerus Right  Result Date: 02/22/2022 AP lateral radiographs right humerus reviewed.  Humeral shaft alignment improved in the AP and lateral planes.  No callus formation present yet.  Small butterfly fragment is noted around the otherwise relatively transverse fracture.

## 2022-03-24 ENCOUNTER — Encounter: Payer: Self-pay | Admitting: Oncology

## 2022-03-24 ENCOUNTER — Telehealth: Payer: Self-pay

## 2022-03-24 ENCOUNTER — Other Ambulatory Visit: Payer: Self-pay | Admitting: Oncology

## 2022-03-24 DIAGNOSIS — F419 Anxiety disorder, unspecified: Secondary | ICD-10-CM

## 2022-03-24 NOTE — Progress Notes (Signed)
[facility]  [address] [place],  [place]  [address] [phone]  Clinic Day: Mar 10, 2022  Referring physician: Bonnita Nasuti, MD   ASSESSMENT & PLAN:   Multiple myeloma This is an IgG kappa but appears quite aggressive with an IgG level increased to 7797 on April 26 an M spike of 6.2.  I am very concerned that his IgG level was over 4 g last month at [facility] health and now is nearly 8 g, so he is progressing rapidly.  The beta-2 microglobulin is 10.9 and he has 70% involvement of the bone marrow.  His prognosis is quite guarded but this disease usually does respond to treatment.  We have been unable to even discuss a treatment regimen as he has not been stable enough.  Unfortunately the FISH analysis has returned and shows monosomy 13 as well as duplication of 1 q. and trisomy 68.  These chromosome abnormalities are all more consistent with an adverse outcome, and this is also true when there are multiple chromosome aberrations.  Severe anemia  He has required at least 3 transfusions now.  Iron studies in the lab were normal but iron stores in the bone marrow are low.  B12 was low normal at 270 and folate was normal.  Retake count was 1.8% and he had a normal MMA and normal copper level.  I feel it is primarily caused by his multiple myeloma.  His hemoglobin has decreased from 8.7-8.2 and so he may require further transfusions if it continues to drop.  Renal insufficiency This is mild to moderate with an EGFR estimated at 46.  He has mild proteinuria with a small M spike in the urine.  Creatinine is 1.5 today.  Chronic orthostatic hypotension He has had this for years due to autonomic dysfunction.  He has been managed by the cardiologist with midodrine and they did try Florinef but that has been stopped.  He will require some steroids as part of his myeloma regimen and hopefully this will help.  He did have a near syncopal episode here in the office  today.  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  His white count has increased from 12,000-16,400 and we have sent off flow cytometry for CLL as the predominant white cells are lymphocytes, 78%.  I will check a flow cytometry to confirm this.  Hypercalcemia This is mild but I expect it will increase and so I described the symptoms to expect.  His calcium is 11.5 today.  He will need to receive zoledronic acid or denosumab.  Severe anxiety and panic attacks Dr. Jannette Fogo prescribed alprazolam 0.5 mg but he and his wife say that has not helped, so I will prescribe lorazepam 1 mg to use every 4 hours as needed.  He does appear quite anxious today.  He is already on Prozac and bupropion.  Mild hyponatremia This appears to be chronic.  Urinary symptoms He has nocturia and hesitancy and some urinary retention.  I think he needs to follow-up with the urologist regarding any medications that could help the symptoms.  We will check a urine for urinalysis and culture if he is able to give [place] a specimen.  There was much to discuss regarding his diagnosis, prognosis and treatment plan.  I went through as much information as they could tolerate and discussed the reasons that I do not feel he  is a transplant candidate.  We will get flow cytometry for CLL and monitor his blood counts.  I expect he will need transfusions again.  He had a rod placed in his humerus and is having extreme pain.  We will need to let the surgery heal at least somewhat prior to starting treatment.  He will need allopurinol and acyclovir when he starts treatment as well as close follow-up, probably weekly with labs weekly. I discussed the assessment and treatment plan with the patient.  The patient was provided an opportunity to ask questions and all were answered.  The patient agreed with the plan and demonstrated an understanding of the  instructions.  The patient was advised to call back if the symptoms worsen or if the condition fails to improve as anticipated.  I provided 35 minutes of face-to-face time during this this encounter and > 50% was spent counseling as documented under my assessment and plan.    [name], MD [place] [address] [place] [address] Dept: [phone] Dept Fax: [phone]    CHIEF COMPLAINT:  CC: Multiple myeloma  Current Treatment: Evaluation for chemotherapy   HISTORY OF PRESENT ILLNESS:  Lance Pham is a 71 y.o. male with a history of severe orthostatic hypotension due to autonomic dysfunction who is referred in consultation with Dr. Shirlee More for assessment and management.  He was hospitalized at [facility] health on April 9 and found to have a fracture of the humerus which appeared to be pathologic in nature.  Skeletal survey did not reveal any other lytic lesions but he was found to have elevated protein, hypercalcemia, renal dysfunction, and anemia.  CT scan of chest abdomen and pelvis revealed tiny pulmonary nodules of the left upper lobe and a 14 mm nodule of the left adrenal gland which was stable.  His hemoglobin dropped as low as 7.8 and he was transfused with 1 unit of packed red blood cells.  He showed up to Dr. Joya Gaskins office on April 25 with severe hypotension, with a systolic blood pressure of 80 and extreme weakness.  He was sent to the emergency room and found to be severely anemic once again with a hemoglobin of 6.6 and transfused with another unit of packed red blood cells.  He continues to feel very weak and has severe pain of the right shoulder which is in an immobilization sling.  While in [facility], he was found to have a total protein up to 10.6 with a calcium of 9.9, creatinine 1.5 and a white count of 12.4 with 79% lymphocytes.  A bone marrow was performed while he was in the  hospital and he is here now with his wife for those results.  During his prior hospitalization at [facility], urine protein electrophoresis revealed 840 mg of protein over 24 hours with 48% of that consistent with Bence-Jones proteins for an M spike in the urine of 400 mg.  He did have evaluation of his anemia and iron studies were normal folate was normal B12 was low normal at 270.  INTERVAL HISTORY:  I have reviewed his chart and materials related to his cancer extensively and collaborated history with the patient. Summary of oncologic history is as follows: Oncology History   No history exists.   Jaysun is seen in the clinic for follow up of his newly diagnosed multiple myeloma, IgG kappa.  He still feels extremely weak and has no appetite.  He  had a rod placed in his right humerus by orthopedic surgeon and his arm is in a sling to immobilize it.  However, I feel some urgency to start treatment for his multiple myeloma if he is able to tolerate it.  I reviewed the bone marrow results with him and there is 64 to 70% plasma cells in the bone marrow with kappa light chains.  However on flow cytometry there is a clonal population of lambda light chain positivity, and we suspect he may have a second malignancy, chronic lymphocytic leukemia perhaps.  I will order a flow cytometry on his peripheral blood.  Storage iron is diminished to absent by iron stains.  He has been placed on midodrine 10 mg 3 times daily.  He has a performance status of 2 and spends much of his time in bed or in the recliner.  Today's labs reveal a hemoglobin of 8. 2, down from 8.7 last week, normal platelet count of 147,000, MCV of 96.  His white count is elevated at 16.4, up from 12.7 with 75% lymphocytes.  Comprehensive metabolic profile reveals a creatinine of 1.5, calcium of 11.5, and total protein now increased to 13.0.  He continues to have elevation of his SGOT, increased from 125 to 140 and he has mild hyponatremia with a sodium of 131.   I had hoped he would be a candidate for a 3 drug regimen of Revlimid, dexamethasone, and either daratumumab or Velcade.  We would do that for 8-12 cycles and then put him on drug maintenance.  I have reviewed information about the diagnosis and treatment today.  I do expect he will need periodic transfusions due to the high percentage of myeloma infiltration of his bone marrow.  I do not feel he is well enough to start it now but we did discuss possibly placing him on dexamethasone.  He is having panic attacks and the alprazolam 0.5 mg is not helping so he will stop that.  I will prescribe lorazepam 1 mg every 4 hours as needed.  We will also administer 1 L of normal saline today.  He will need close support and weekly follow-up. He denies fever, chills, night sweats, or other signs of infection. He denies cardiorespiratory and gastrointestinal issues. He  denies pain. His appetite is poor and he is losing weight.  He has severe fatigue, weakness and dizziness, with right shoulder pain, hypotension and tachypnea. HISTORY:   Past Medical History:  Diagnosis Date  . Acute intermittent porphyria ([place]) 12/04/2020  . Allergic rhinitis 12/04/2020  . Arthritis    RIGHT knee  . Benign prostatic hyperplasia with lower urinary tract symptoms 12/04/2020  . Binge eating disorder 12/04/2020  . CAD in native artery 06/22/2019  . Clotting disorder [facility])    has a genetic clotting dx -unknown name  . Cutaneous abscess 12/04/2020  . Dyslipidemia 07/14/2018  . Essential hypertension 07/14/2018  . Gastro-esophageal reflux disease without esophagitis 12/04/2020  . Generalized anxiety disorder 12/04/2020  . Hyperprolactinemia ([place]) 06/02/2017  . Hypothyroidism 12/04/2020  . 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 Moderate recurrent major depression ([place]) 12/04/2020  . Multiple myeloma ([place])   . Orthostatic hypotension   . Other vitamin B12 deficiency anemias 12/04/2020  . Overactive bladder 12/04/2020  . Paroxysmal  atrial fibrillation ([place]) 06/22/2019  . Peripheral vascular disease ([place]) 12/04/2020  . 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 3 mm pituitary microadenoma. Likely nonfunctional.  . PONV (postoperative nausea  and vomiting)   . RBBB (right bundle branch block) 10/14/2019  . Sleep apnea    use CPAP  . 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 Vitamin D deficiency 12/04/2020  Anxiety and panic attacks Pathologic fracture of the right humerus History of COVID-19 infection last year Osteoarthritis 9  Past Surgical History:  Procedure Laterality Date  . CATARACT EXTRACTION, BILATERAL    . CHOLECYSTECTOMY    . COLONOSCOPY  2017   RG-mag citrtae (good)-TICS/TA x 1  . FOOT SURGERY Left    tendon link  . HAMMER TOE SURGERY    . HUMERUS IM NAIL Right 03/06/2022   Procedure: RIGHT INTRAMEDULLARY (IM) NAIL HUMERAL;  Surgeon: Meredith Pel, MD;  Location: [facility];  Service: Orthopedics;  Laterality: Right;  . KNEE SURGERY Right   . LAPAROSCOPIC GASTRIC BANDING    . POLYPECTOMY  2017   TA x 1  . WISDOM TOOTH EXTRACTION    . WRIST RECONSTRUCTION Right     Family History  Problem Relation Age of Onset  . Colon cancer Mother 87  . Colon polyps Mother 63  . Thyroid cancer Father 42       parathyroid CA  . Colon polyps Sister   . Colon polyps Brother   . Colon polyps Sister   . Colon polyps Sister   . Colon polyps Sister   . Esophageal cancer Neg Hx   . Stomach cancer Neg Hx   . Rectal cancer Neg Hx     Social History:  reports that he has never smoked. He has never used smokeless tobacco. He reports that he does not drink alcohol and does not use drugs.The patient is married and accompanied by his wife today.  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  Allergies:  Allergies  Allergen Reactions  . Nsaids Other (See Comments)    Patient had a Lap band placed is suppose to have NO NSAIDS due to the possibility of  esophageal erosion leading to band "coming through"  . Tamiflu [Oseltamivir] Nausea Only and Other (See Comments)    Stomach upset Per patient never had a problem    Current Medications: Current Outpatient Medications  Medication Sig Dispense Refill  . acetaminophen (TYLENOL) 500 MG tablet Take 1,000 mg by mouth every 6 (six) hours as needed for mild pain or headache.    Marland Kitchen apixaban (ELIQUIS) 5 MG TABS tablet Take 5 mg by mouth 2 (two) times daily.    . benzonatate (TESSALON) 200 MG capsule Take 200 mg by mouth 3 (three) times daily as needed for cough.    . Blood Glucose Monitoring Suppl (ONE TOUCH ULTRA 2) w/Device KIT daily.    Marland Kitchen buPROPion (WELLBUTRIN SR) 150 MG 12 hr tablet Take 150 mg by mouth at bedtime.    . cyanocobalamin (,VITAMIN B-12,) 1000 MCG/ML injection Inject 1 mL (1,000 mcg total) into the muscle every 30 (thirty) days. 1 mL 0  . dexamethasone (DECADRON) 4 MG tablet Take 5 tablets (20 mg total) by mouth once a week. 20 tablet 3  . ferrous sulfate 324 (65 Fe) MG TBEC Take 324 mg by mouth daily after supper.    . fludrocortisone (FLORINEF) 0.1 MG tablet Take 100 mcg by mouth daily.    Marland Kitchen FLUoxetine (PROZAC) 40 MG capsule Take 40 mg by mouth at bedtime.    Marland Kitchen FOLIC ACID PO Take 465 mcg by mouth at bedtime.    Marland Kitchen LORazepam (ATIVAN) 1 MG tablet Take 1 tablet (1  mg total) by mouth every 4 (four) hours as needed for anxiety. 30 tablet 0  . methocarbamol (ROBAXIN) 500 MG tablet Take 1 tablet (500 mg total) by mouth every 6 (six) hours as needed for muscle spasms. 30 tablet 0  . midodrine (PROAMATINE) 10 MG tablet Take 10 mg by mouth 3 (three) times daily.    . ondansetron (ZOFRAN) 8 MG tablet Take 1 tablet (8 mg total) by mouth daily as needed for nausea or vomiting. 90 tablet 0  . oxyCODONE (OXY IR/ROXICODONE) 5 MG immediate release tablet Take 1-2 tablets (5-10 mg total) by mouth every 4 (four) hours as needed for moderate pain (pain score 4-6). 120 tablet 0  . polyethylene glycol  (MIRALAX / GLYCOLAX) 17 g packet Take 17 g by mouth daily as needed for mild constipation or moderate constipation. 14 each 0  . pyridostigmine (MESTINON) 60 MG tablet Take 0.5 tablets (30 mg total) by mouth 2 (two) times daily. 30 tablet 0  . senna-docusate (SENOKOT-S) 8.6-50 MG tablet Take 2 tablets by mouth at bedtime as needed for mild constipation or moderate constipation. 10 tablet 0  . tamsulosin (FLOMAX) 0.4 MG CAPS capsule Take 0.4 mg by mouth at bedtime.     No current facility-administered medications for this visit.    REVIEW OF SYSTEMS:  Review of Systems  Constitutional:  Positive for appetite change and fatigue.  HENT:  Negative.    Eyes: Negative.   Respiratory:  Positive for shortness of breath.   Cardiovascular:  Positive for leg swelling.  Gastrointestinal: Negative.   Genitourinary:  Positive for difficulty urinating, frequency and nocturia.   Musculoskeletal:  Positive for arthralgias and back pain.  Skin: Negative.   Neurological:  Positive for dizziness, extremity weakness and light-headedness.  Hematological:  Bruises/bleeds easily.  Psychiatric/Behavioral:  Positive for depression and sleep disturbance. The patient is nervous/anxious.      VITALS:  Blood pressure (!) 168/70, pulse 60, resp. rate 18, SpO2 93 %.  Wt Readings from Last 3 Encounters:  03/18/22 253 lb 6.4 oz (114.9 kg)  03/06/22 261 lb (118.4 kg)  02/25/22 263 lb (119.3 kg)    There is no height or weight on file to calculate BMI.  Performance status (ECOG): 2 - Symptomatic, <50% confined to bed  PHYSICAL EXAM:  Physical Exam Vitals and nursing note reviewed.  Constitutional:      Appearance: He is ill-appearing.  HENT:     Head: Normocephalic and atraumatic.     Nose: Nose normal.     Mouth/Throat:     Pharynx: Oropharynx is clear.  Eyes:     Extraocular Movements: Extraocular movements intact.     Conjunctiva/sclera: Conjunctivae normal.     Pupils: Pupils are equal, round, and  reactive to light.  Cardiovascular:     Rate and Rhythm: Normal rate and regular rhythm.     Heart sounds: Normal heart sounds.  Pulmonary:     Breath sounds: Normal breath sounds.     Comments: tachypnea Abdominal:     General: Bowel sounds are normal.     Palpations: Abdomen is soft.  Genitourinary:    Penis: Normal.   Musculoskeletal:        General: Signs of injury present.     Cervical back: Normal range of motion.     Comments: Right arm is in a sling due to fracture of the upper humerus  Skin:    General: Skin is dry.     Coloration: Skin is pale.  Findings: Bruising present.     Comments: Decreased skin turgor turgor  Neurological:     General: No focal deficit present.     Motor: Weakness present.  Psychiatric:     Comments: He appears anxious and restless.  He is having a panic attack while here.    LABS:      Latest Ref Rng & Units 03/18/2022   12:00 AM 03/07/2022    1:12 AM 03/06/2022   10:26 PM  CBC  WBC  13.3      17.3   16.0    Hemoglobin 13.5 - 17.5 8.3      8.2   8.4    Hematocrit 41 - 53 26      26.6   26.3    Platelets 150 - 400 K/uL 196      145   148       This result is from an external source.       Latest Ref Rng & Units 03/18/2022   12:00 AM 03/07/2022    1:12 AM 03/06/2022   10:26 PM  CMP  Glucose 70 - 99 mg/dL  134   137    BUN 4 - 21 21      20   19     Creatinine 0.6 - 1.3 2.2      1.49   1.48    Sodium 137 - 147 130      127   129    Potassium 3.5 - 5.1 mEq/L 4.4      5.1   5.0    Chloride 99 - 108 101      101   101    CO2 13 - 22 29      23   24     Calcium 8.7 - 10.7 11.3      8.9   8.8    Alkaline Phos 25 - 125 182         AST 14 - 40 151         ALT 10 - 40 U/L 58            This result is from an external source.      No results found for: CEA1 / No results found for: CEA1 No results found for: PSA1 No results found for: CAN199 No results found for: CAN125  Lab Results  Component Value Date   TOTALPROTELP POSSIBLE  INTERFERING SUBSTANCE 03/05/2022   ALBUMINELP NOT PERFORMED 03/05/2022   A1GS NOT PERFORMED 03/05/2022   A2GS NOT PERFORMED 03/05/2022   BETS NOT PERFORMED 03/05/2022   GAMS NOT PERFORMED 03/05/2022   MSPIKE 4.2 (H) 02/10/2022   SPEI Comment 02/10/2022   Lab Results  Component Value Date   TIBC 343 02/09/2022   FERRITIN 332 02/09/2022   FERRITIN 319 11/22/2020   FERRITIN 312 11/21/2020   IRONPCTSAT 36 02/09/2022   Lab Results  Component Value Date   LDH 279 (H) 11/21/2020    STUDIES:  DG Humerus Right  Result Date: 03/06/2022 CLINICAL DATA:  Postop EXAM: RIGHT HUMERUS - 2+ VIEW COMPARISON:  02/10/2022 bone survey FINDINGS: Interval intramedullary rod with proximal and distal screw fixation of right humerus for fracture involving midshaft of humerus, this is associated with lucent bone lesion and would be consistent with pathologic fracture. Anatomic alignment. Soft tissue calcifications versus linear osseous fragments at the site of fracture. IMPRESSION: Interval surgical fixation of pathologic mid humerus fracture with anatomic alignment. Electronically Signed   By:  Donavan Foil M.D.   On: 03/06/2022 21:18   DG Humerus Right  Result Date: 03/06/2022 CLINICAL DATA:  Known right humeral fracture EXAM: RIGHT HUMERUS - 2+ VIEW COMPARISON:  02/28/2022 FLUOROSCOPY TIME:  Radiation Exposure Index (as provided by the fluoroscopic device): 5.52 mGy If the device does not provide the exposure index: Fluoroscopy Time:  1 minute 40 seconds Number of Acquired Images:  8 FINDINGS: The previously seen right humeral fractures again noted. Medullary rod is now seen within the humerus with proximal and distal fixation screws. There is again noted a overall lytic lesion at the level of fracture consistent with pathologic fracture. No other focal abnormality is seen. IMPRESSION: ORIF of right humeral fracture secondary to pathologic fracture. Electronically Signed   By: Inez Catalina M.D.   On: 03/06/2022  19:54   DG C-Arm 1-60 Min-No Report  Result Date: 03/06/2022 Fluoroscopy was utilized by the requesting physician.  No radiographic interpretation.   DG C-Arm 1-60 Min-No Report  Result Date: 03/06/2022 Fluoroscopy was utilized by the requesting physician.  No radiographic interpretation.   DG C-Arm 1-60 Min-No Report  Result Date: 03/06/2022 Fluoroscopy was utilized by the requesting physician.  No radiographic interpretation.   XR Sacrum/Coccyx  Result Date: 03/18/2022 AP lateral radiographs sacrum and coccyx were reviewed.  No acute fracture.  Bony contours intact.  No compression fractures or deformity present  XR Humerus Right  Result Date: 03/18/2022 Multiple views right humerus reviewed.  Intramedullary nail transfixes pathologic humeral shaft fracture in the midshaft region.  No new fracture is present.  Hardware positioning unchanged from immediate postop and intraoperative images.  XR Humerus Right  Result Date: 02/28/2022 AP and lateral views of right humerus reviewed.  Midshaft humerus fracture again noted in good alignment with very little angulation.  Fracture seems slightly more distracted than previous radiographs.  No callus formation.

## 2022-03-24 NOTE — Telephone Encounter (Signed)
Pt's wife wants to know what she should do. She states "I feel like Lance Pham has gotten worse over the weekend. He cant help to stand at all. He is sleeping most of the time. He hasn't had but 1 BM since Easter".  No emesis. He continues to have intermittent nausea. Her son came to her home to help transfer him. She mentioned labs? I sent message to Dr Hinton Rao and [facility] @ 1210-awc

## 2022-03-25 ENCOUNTER — Encounter: Payer: Self-pay | Admitting: Hematology and Oncology

## 2022-03-25 ENCOUNTER — Inpatient Hospital Stay (INDEPENDENT_AMBULATORY_CARE_PROVIDER_SITE_OTHER): Payer: PPO | Admitting: Hematology and Oncology

## 2022-03-25 ENCOUNTER — Telehealth: Payer: Self-pay

## 2022-03-25 ENCOUNTER — Other Ambulatory Visit: Payer: Self-pay | Admitting: Oncology

## 2022-03-25 ENCOUNTER — Other Ambulatory Visit: Payer: PPO

## 2022-03-25 DIAGNOSIS — F419 Anxiety disorder, unspecified: Secondary | ICD-10-CM

## 2022-03-25 DIAGNOSIS — C9000 Multiple myeloma not having achieved remission: Secondary | ICD-10-CM | POA: Diagnosis not present

## 2022-03-25 NOTE — Assessment & Plan Note (Signed)
This is an IgG kappa but appears quite aggressive with an IgG level increased to 7797 on April 26 an M spike of 6.2.  I am very concerned that his IgG level was over 4 g last month at [facility] and now is nearly 8 g, so he is progressing rapidly.  The beta-2 microglobulin is 10.9 and he has 70% involvement of the bone marrow.  His prognosis is quite guarded but this disease usually does respond to treatment.  We have been unable to even discuss a treatment regimen as he has not been stable enough.  Unfortunately the FISH analysis has returned and shows monosomy 13 as well as duplication of 1 q. and trisomy 34.  These chromosome abnormalities are all more consistent with an adverse outcome, and this is also true when there are multiple chromosome aberrations. He presents to clinic today, unable to get out of the [facility] as he is very weak. He is declining quickly. His wife and other family members are present as well. Dr. Hinton Rao accompanied me to assess him and the patient stated he is ready for Hospice. He verbalizes that he would like to be admitted to the [facility]. We discussed with his wife his increasing decline and poor prognosis. She is also agreeable to hospice services at this time. We will refer for evaluation.

## 2022-03-25 NOTE — Discharge Summary (Signed)
Physician Discharge Summary      Patient ID: Lance Pham MRN: [id number] DOB/AGE: Jul 11, 1951 71 y.o.  Admit date: 03/06/2022 Discharge date: 03/07/2022  Admission Diagnoses:  Principal Problem:   Fracture of humeral shaft, closed Active Problems:   Paroxysmal atrial fibrillation (HCC)   Multiple myeloma (HCC)   Hyponatremia   Chronic kidney disease, stage 3a ([place])   Orthostatic hypotension   Discharge Diagnoses:  Same  Surgeries: Procedure(s): RIGHT INTRAMEDULLARY (IM) NAIL HUMERAL on 03/06/2022   Consultants: Treatment Team:  Dory Horn, MD  Discharged Condition: Stable  Hospital Course: Lance Pham is an 71 y.o. male who was admitted 03/06/2022 with a chief complaint of right arm pain, and found to have a diagnosis of Fracture of humeral shaft, closed.  The fracture is likely pathologic.  They were brought to the operating room on 03/06/2022 and underwent the above named procedures.  Patient tolerated the procedure well.  Discharged to home on postop day #1 with pain control.  He will stay in a sling for the first several weeks after surgery before beginning shoulder range of motion exercises.  He will follow-up with [place] in approximately 10 days for suture removal.  Radial nerve function intact postoperatively  Antibiotics given:  Anti-infectives (From admission, onward)    Start     Dose/Rate Route Frequency Ordered Stop   03/07/22 0600  ceFAZolin (ANCEF) IVPB 2g/100 mL premix        2 g 200 mL/hr over 30 Minutes Intravenous On call to O.R. 03/06/22 1347 03/06/22 1720   03/07/22 0100  ceFAZolin (ANCEF) IVPB 2g/100 mL premix  Status:  Discontinued        2 g 200 mL/hr over 30 Minutes Intravenous Every 8 hours 03/06/22 2205 03/07/22 1811   03/06/22 1812  vancomycin (VANCOCIN) powder  Status:  Discontinued          As needed 03/06/22 1812 03/06/22 2047     .  Recent vital signs:  Vitals:   03/07/22 0524 03/07/22 0833  BP: (!) 169/73 118/87  Pulse: 67 71   Resp: 17 18  Temp: 97.6 F (36.4 C) 98.4 F (36.9 C)  SpO2: 98% 96%    Recent laboratory studies:  Results for orders placed or performed during the hospital encounter of 03/06/22  CBC  Result Value Ref Range   WBC 16.0 (H) 4.0 - 10.5 K/uL   RBC 2.68 (L) 4.22 - 5.81 MIL/uL   Hemoglobin 8.4 (L) 13.0 - 17.0 g/dL   HCT 26.3 (L) 39.0 - 52.0 %   MCV 98.1 80.0 - 100.0 fL   MCH 31.3 26.0 - 34.0 pg   MCHC 31.9 30.0 - 36.0 g/dL   RDW 21.9 (H) 11.5 - 15.5 %   Platelets 148 (L) 150 - 400 K/uL   nRBC 1.0 (H) 0.0 - 0.2 %  Basic metabolic panel  Result Value Ref Range   Sodium 129 (L) 135 - 145 mmol/L   Potassium 5.0 3.5 - 5.1 mmol/L   Chloride 101 98 - 111 mmol/L   CO2 24 22 - 32 mmol/L   Glucose, Bld 137 (H) 70 - 99 mg/dL   BUN 19 8 - 23 mg/dL   Creatinine, Ser 1.48 (H) 0.61 - 1.24 mg/dL   Calcium 8.8 (L) 8.9 - 10.3 mg/dL   GFR, Estimated 51 (L) >60 mL/min   Anion gap 4 (L) 5 - 15  Glucose, capillary  Result Value Ref Range   Glucose-Capillary 121 (H) 70 - 99 mg/dL  CBC  Result Value Ref Range   WBC 17.3 (H) 4.0 - 10.5 K/uL   RBC 2.67 (L) 4.22 - 5.81 MIL/uL   Hemoglobin 8.2 (L) 13.0 - 17.0 g/dL   HCT 26.6 (L) 39.0 - 52.0 %   MCV 99.6 80.0 - 100.0 fL   MCH 30.7 26.0 - 34.0 pg   MCHC 30.8 30.0 - 36.0 g/dL   RDW 22.1 (H) 11.5 - 15.5 %   Platelets 145 (L) 150 - 400 K/uL   nRBC 1.0 (H) 0.0 - 0.2 %  Basic metabolic panel  Result Value Ref Range   Sodium 127 (L) 135 - 145 mmol/L   Potassium 5.1 3.5 - 5.1 mmol/L   Chloride 101 98 - 111 mmol/L   CO2 23 22 - 32 mmol/L   Glucose, Bld 134 (H) 70 - 99 mg/dL   BUN 20 8 - 23 mg/dL   Creatinine, Ser 1.49 (H) 0.61 - 1.24 mg/dL   Calcium 8.9 8.9 - 10.3 mg/dL   GFR, Estimated 50 (L) >60 mL/min   Anion gap 3 (L) 5 - 15  Type and screen [place]  Result Value Ref Range   ABO/RH(D) O NEG    Antibody Screen NEG    Sample Expiration 03/09/2022,2359    Unit Number [id number]    Blood Component Type RED CELLS,LR     Unit division 00    Status of Unit REL FROM [facility]    Transfusion Status OK TO TRANSFUSE    Crossmatch Result      Compatible Performed at [facility], [address], [place], [place] [address]    Unit Number [id number]    Blood Component Type RED CELLS,LR    Unit division 00    Status of Unit REL FROM [facility]    Transfusion Status OK TO TRANSFUSE    Crossmatch Result Compatible   Prepare RBC (crossmatch)  Result Value Ref Range   Order Confirmation      ORDER PROCESSED BY BLOOD BANK Performed at [facility], [place] [address], [place], [place] [address]   BPAM [facility]  Result Value Ref Range   ISSUE DATE / TIME [id number]    Blood Product Unit Number [id number]    PRODUCT CODE [id number]    Unit Type and Rh 9500    Blood Product Expiration Date 852778242353    ISSUE DATE / TIME 614431540086    Blood Product Unit Number [id number]    PRODUCT CODE [id number]    Unit Type and Rh 9500    Blood Product Expiration Date [id number]     Discharge Medications:   Allergies as of 03/07/2022       Reactions   Nsaids Other (See Comments)   Patient had a Lap band placed is suppose to have NO NSAIDS due to the possibility of esophageal erosion leading to band "coming through"   Tamiflu [oseltamivir] Nausea Only, Other (See Comments)   Stomach upset Per patient never had a problem        Medication List     STOP taking these medications    albuterol 108 (90 Base) MCG/ACT inhaler Commonly known as: VENTOLIN HFA       TAKE these medications    acetaminophen 500 MG tablet Commonly known as: TYLENOL Take 1,000 mg by mouth every 6 (six) hours as needed for mild pain or headache.   apixaban 5 MG Tabs tablet Commonly known as: ELIQUIS Take 5 mg by mouth 2 (two) times  daily.   benzonatate 200 MG capsule Commonly known as: TESSALON Take 200 mg by mouth 3 (three) times daily as needed for cough.   buPROPion 150 MG 12 hr tablet Commonly known as: WELLBUTRIN  SR Take 150 mg by mouth at bedtime.   cyanocobalamin 1000 MCG/ML injection Commonly known as: (VITAMIN B-12) Inject 1 mL (1,000 mcg total) into the muscle every 30 (thirty) days.   ferrous sulfate 324 (65 Fe) MG Tbec Take 324 mg by mouth daily after supper.   FLUoxetine 40 MG capsule Commonly known as: PROZAC Take 40 mg by mouth at bedtime.   FOLIC ACID PO Take 967 mcg by mouth at bedtime.   methocarbamol 500 MG tablet Commonly known as: ROBAXIN Take 1 tablet (500 mg total) by mouth every 6 (six) hours as needed for muscle spasms.   midodrine 10 MG tablet Commonly known as: PROAMATINE Take 10 mg by mouth 3 (three) times daily.   polyethylene glycol 17 g packet Commonly known as: MIRALAX / GLYCOLAX Take 17 g by mouth daily as needed for mild constipation or moderate constipation.   pyridostigmine 60 MG tablet Commonly known as: MESTINON Take 0.5 tablets (30 mg total) by mouth 2 (two) times daily.   senna-docusate 8.6-50 MG tablet Commonly known as: Senokot-S Take 2 tablets by mouth at bedtime as needed for mild constipation or moderate constipation.   tamsulosin 0.4 MG Caps capsule Commonly known as: FLOMAX Take 0.4 mg by mouth at bedtime.        Diagnostic Studies: DG Humerus Right  Result Date: 03/06/2022 CLINICAL DATA:  Postop EXAM: RIGHT HUMERUS - 2+ VIEW COMPARISON:  02/10/2022 bone survey FINDINGS: Interval intramedullary rod with proximal and distal screw fixation of right humerus for fracture involving midshaft of humerus, this is associated with lucent bone lesion and would be consistent with pathologic fracture. Anatomic alignment. Soft tissue calcifications versus linear osseous fragments at the site of fracture. IMPRESSION: Interval surgical fixation of pathologic mid humerus fracture with anatomic alignment. Electronically Signed   By: Donavan Foil M.D.   On: 03/06/2022 21:18   DG Humerus Right  Result Date: 03/06/2022 CLINICAL DATA:  Known right humeral  fracture EXAM: RIGHT HUMERUS - 2+ VIEW COMPARISON:  02/28/2022 FLUOROSCOPY TIME:  Radiation Exposure Index (as provided by the fluoroscopic device): 5.52 mGy If the device does not provide the exposure index: Fluoroscopy Time:  1 minute 40 seconds Number of Acquired Images:  8 FINDINGS: The previously seen right humeral fractures again noted. Medullary rod is now seen within the humerus with proximal and distal fixation screws. There is again noted a overall lytic lesion at the level of fracture consistent with pathologic fracture. No other focal abnormality is seen. IMPRESSION: ORIF of right humeral fracture secondary to pathologic fracture. Electronically Signed   By: Inez Catalina M.D.   On: 03/06/2022 19:54   DG C-Arm 1-60 Min-No Report  Result Date: 03/06/2022 Fluoroscopy was utilized by the requesting physician.  No radiographic interpretation.   DG C-Arm 1-60 Min-No Report  Result Date: 03/06/2022 Fluoroscopy was utilized by the requesting physician.  No radiographic interpretation.   DG C-Arm 1-60 Min-No Report  Result Date: 03/06/2022 Fluoroscopy was utilized by the requesting physician.  No radiographic interpretation.   XR Sacrum/Coccyx  Result Date: 03/18/2022 AP lateral radiographs sacrum and coccyx were reviewed.  No acute fracture.  Bony contours intact.  No compression fractures or deformity present  XR Humerus Right  Result Date: 03/18/2022 Multiple views right humerus reviewed.  Intramedullary nail transfixes pathologic humeral shaft fracture in the midshaft region.  No new fracture is present.  Hardware positioning unchanged from immediate postop and intraoperative images.  XR Humerus Right  Result Date: 02/28/2022 AP and lateral views of right humerus reviewed.  Midshaft humerus fracture again noted in good alignment with very little angulation.  Fracture seems slightly more distracted than previous radiographs.  No callus formation.   Disposition: Discharge disposition:  01-Home or Self Care       Discharge Instructions     Call MD / Call 911   Complete by: As directed    If you experience chest pain or shortness of breath, CALL 911 and be transported to the hospital emergency room.  If you develope a fever above 101 F, pus (white drainage) or increased drainage or redness at the wound, or calf pain, call your surgeon's office.   Constipation Prevention   Complete by: As directed    Drink plenty of fluids.  Prune juice may be helpful.  You may use a stool softener, such as Colace (over the counter) 100 mg twice a day.  Use MiraLax (over the counter) for constipation as needed.   Diet - low sodium heart healthy   Complete by: As directed    Discharge instructions   Complete by: As directed    Okay for elbow range of motion daily Okay to shower.  Dressings are waterproof Otherwise stay in sling until return office visit We will start shoulder range of motion exercises at return office visit   Increase activity slowly as tolerated   Complete by: As directed    Post-operative opioid taper instructions:   Complete by: As directed    POST-OPERATIVE OPIOID TAPER INSTRUCTIONS: It is important to wean off of your opioid medication as soon as possible. If you do not need pain medication after your surgery it is ok to stop day one. Opioids include: Codeine, Hydrocodone(Norco, Vicodin), Oxycodone(Percocet, oxycontin) and hydromorphone amongst others.  Long term and even short term use of opiods can cause: Increased pain response Dependence Constipation Depression Respiratory depression And more.  Withdrawal symptoms can include Flu like symptoms Nausea, vomiting And more Techniques to manage these symptoms Hydrate well Eat regular healthy meals Stay active Use relaxation techniques(deep breathing, meditating, yoga) Do Not substitute Alcohol to help with tapering If you have been on opioids for less than two weeks and do not have pain than it is ok  to stop all together.  Plan to wean off of opioids This plan should start within one week post op of your joint replacement. Maintain the same interval or time between taking each dose and first decrease the dose.  Cut the total daily intake of opioids by one tablet each day Next start to increase the time between doses. The last dose that should be eliminated is the evening dose.             Signed: Anderson Malta 03/25/2022, 10:17 AM

## 2022-03-25 NOTE — Telephone Encounter (Signed)
-----   Message from Melodye Ped, NP sent at 03/25/2022  2:36 PM EDT ----- Regarding: RE: Hospice That's very good news. Thank you! ----- Message ----- From: Georgette Shell, RN Sent: 03/25/2022   2:33 PM EDT To: [name], MD, Melodye Ped, NP Subject: Hospice                                        Roxanne from [facility] called to let us know patient would be going to [facility] via EMS this evening.

## 2022-03-25 NOTE — Progress Notes (Signed)
Patient Care Team: Hague, Rosalyn Charters, MD as PCP - General (Internal Medicine) Bettina Gavia Hilton Cork, MD as PCP - Cardiology (Cardiology) Flossie Buffy., MD as Referring Physician (Cardiology)  Clinic Day:  03/25/2022  Referring physician: Bonnita Nasuti, MD  ASSESSMENT & PLAN:   Assessment & Plan: Multiple myeloma [facility]) This is an IgG kappa but appears quite aggressive with an IgG level increased to 7797 on April 26 an M spike of 6.2.  I am very concerned that his IgG level was over 4 g last month at [facility] and now is nearly 8 g, so he is progressing rapidly.  The beta-2 microglobulin is 10.9 and he has 70% involvement of the bone marrow.  His prognosis is quite guarded but this disease usually does respond to treatment.  We have been unable to even discuss a treatment regimen as he has not been stable enough.  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  These chromosome abnormalities are all more consistent with an adverse outcome, and this is also true when there are multiple chromosome aberrations. He presents to clinic today, unable to get out of the [facility] as he is very weak. He is declining quickly. His wife and other family members are present as well. Dr. Hinton Rao accompanied me to assess him and the patient stated he is ready for Hospice. He verbalizes that he would like to be admitted to the [facility]. We discussed with his wife his increasing decline and poor prognosis. She is also agreeable to hospice services at this time. We will refer for evaluation.    The patient understands the plans discussed today and is in agreement with them.  He knows to contact our office if he develops concerns prior to his next appointment.    Melodye Ped, NP  [facility] [address] [place] [address] Dept: [phone] Dept Fax: [phone]   No orders  of the defined types were placed in this encounter.     CHIEF COMPLAINT:  CC: A 71 year old male with history of multiple myeloma here for symptom management; severe weakness, general decline of condition  Current Treatment:  Dexamethasone 2 mg daily  INTERVAL HISTORY:  Lance Pham is here today for repeat clinical assessment. He is very weak. He has poor to no appetite. He is constipated. He is generally declining rapidly.   I have reviewed the past medical history, past surgical history, social history and family history with the patient and they are unchanged from previous note.  ALLERGIES:  is allergic to nsaids and tamiflu [oseltamivir].  MEDICATIONS:  Current Outpatient Medications  Medication Sig Dispense Refill   acetaminophen (TYLENOL) 500 MG tablet Take 1,000 mg by mouth every 6 (six) hours as needed for mild pain or headache.     apixaban (ELIQUIS) 5 MG TABS tablet Take 5 mg by mouth 2 (two) times daily.     benzonatate (TESSALON) 200 MG capsule Take 200 mg by mouth 3 (three) times daily as needed for cough.     Blood Glucose Monitoring Suppl (ONE TOUCH ULTRA 2) w/Device KIT daily.     buPROPion (WELLBUTRIN SR) 150 MG 12 hr tablet Take 150 mg by mouth at bedtime.     cyanocobalamin (,VITAMIN B-12,) 1000 MCG/ML injection Inject 1 mL (1,000 mcg total) into the muscle every 30 (thirty) days. 1 mL 0   dexamethasone (  DECADRON) 4 MG tablet Take 5 tablets (20 mg total) by mouth once a week. 20 tablet 3   ferrous sulfate 324 (65 Fe) MG TBEC Take 324 mg by mouth daily after supper.     fludrocortisone (FLORINEF) 0.1 MG tablet Take 100 mcg by mouth daily.     FLUoxetine (PROZAC) 40 MG capsule Take 40 mg by mouth at bedtime.     FOLIC ACID PO Take 045 mcg by mouth at bedtime.     LORazepam (ATIVAN) 1 MG tablet Take 1 tablet (1 mg total) by mouth every 4 (four) hours as needed for anxiety. 30 tablet 0   methocarbamol (ROBAXIN) 500 MG tablet Take 1 tablet (500 mg total) by mouth every 6 (six)  hours as needed for muscle spasms. 30 tablet 0   midodrine (PROAMATINE) 10 MG tablet Take 10 mg by mouth 3 (three) times daily.     ondansetron (ZOFRAN) 8 MG tablet Take 1 tablet (8 mg total) by mouth daily as needed for nausea or vomiting. 90 tablet 0   oxyCODONE (OXY IR/ROXICODONE) 5 MG immediate release tablet Take 1-2 tablets (5-10 mg total) by mouth every 4 (four) hours as needed for moderate pain (pain score 4-6). 120 tablet 0   polyethylene glycol (MIRALAX / GLYCOLAX) 17 g packet Take 17 g by mouth daily as needed for mild constipation or moderate constipation. 14 each 0   pyridostigmine (MESTINON) 60 MG tablet Take 0.5 tablets (30 mg total) by mouth 2 (two) times daily. 30 tablet 0   senna-docusate (SENOKOT-S) 8.6-50 MG tablet Take 2 tablets by mouth at bedtime as needed for mild constipation or moderate constipation. 10 tablet 0   tamsulosin (FLOMAX) 0.4 MG CAPS capsule Take 0.4 mg by mouth at bedtime.     No current facility-administered medications for this visit.    HISTORY OF PRESENT ILLNESS:   Oncology History  Multiple myeloma ([place])  02/25/2022 Initial Diagnosis   Multiple myeloma ([place])    03/05/2022 Cancer Staging   Staging form: Plasma Cell Myeloma and Plasma Cell Disorders, AJCC 8th Edition - Clinical stage from 03/05/2022: RISS Stage III (Beta-2-microglobulin (mg/L): 10.9, Albumin (g/dL): 3.2, ISS: Stage III, High-risk cytogenetics: Present) - Signed by [name], MD on 03/24/2022 Histopathologic type: Multiple myeloma Stage prefix: Initial diagnosis Beta 2 microglobulin range (mg/L): Greater than or equal to 5.5 Albumin range (g/dL): Less than 3.5 Cytogenetics: 1q addition, 17p deletion Diagnostic confirmation: Positive histology PLUS positive immunophenotyping and/or positive genetic studies Specimen type: Aspirate Serum calcium level: Elevated Pre-operative calcium level (mg/dL): 11 Creatinine (mg/dL): 1.5 Bone disease on imaging: Present Number of bone  lesions identified on imaging: 1 Hemoglobin (Hgb) (g/dL): 8.7 Pretreatment IgG (mg/dL): 7,797 Pretreatment monoclonal protein level in 24-hour urine (M spike) (g): 0.4 Prognostic indicators: Monosomy 13, Trisomy 17, duplication of 1q Stage used in treatment planning: Yes National guidelines used in treatment planning: Yes Type of national guideline used in treatment planning: NCCN        VITALS:  There were no vitals taken for this visit.  Wt Readings from Last 3 Encounters:  03/18/22 253 lb 6.4 oz (114.9 kg)  03/06/22 261 lb (118.4 kg)  02/25/22 263 lb (119.3 kg)    There is no height or weight on file to calculate BMI.  Performance status (ECOG): 4 - Bedbound      LABORATORY DATA:  I have reviewed the data as listed    Component Value Date/Time   NA 130 (A) 03/18/2022 0000  K 4.4 03/18/2022 0000   CL 101 03/18/2022 0000   CO2 29 (A) 03/18/2022 0000   GLUCOSE 134 (H) 03/07/2022 0112   BUN 21 03/18/2022 0000   CREATININE 2.2 (A) 03/18/2022 0000   CREATININE 1.49 (H) 03/07/2022 0112   CALCIUM 11.3 (A) 03/18/2022 0000   PROT 12.7 (A) 03/05/2022 0000   ALBUMIN 3.2 (A) 03/18/2022 0000   AST 151 (A) 03/18/2022 0000   ALT 58 (A) 03/18/2022 0000   ALKPHOS 182 (A) 03/18/2022 0000   BILITOT 0.7 02/10/2022 0013   GFRNONAA 50 (L) 03/07/2022 0112    No results found for: SPEP, UPEP  Lab Results  Component Value Date   WBC 13.3 03/18/2022   NEUTROABS 4.79 03/18/2022   HGB 8.3 (A) 03/18/2022   HCT 26 (A) 03/18/2022   MCV 99.6 03/07/2022   PLT 196 03/18/2022      Chemistry      Component Value Date/Time   NA 130 (A) 03/18/2022 0000   K 4.4 03/18/2022 0000   CL 101 03/18/2022 0000   CO2 29 (A) 03/18/2022 0000   BUN 21 03/18/2022 0000   CREATININE 2.2 (A) 03/18/2022 0000   CREATININE 1.49 (H) 03/07/2022 0112   GLU 113 03/18/2022 0000      Component Value Date/Time   CALCIUM 11.3 (A) 03/18/2022 0000   ALKPHOS 182 (A) 03/18/2022 0000   AST 151 (A) 03/18/2022  0000   ALT 58 (A) 03/18/2022 0000   BILITOT 0.7 02/10/2022 0013       RADIOGRAPHIC STUDIES: I have personally reviewed the radiological images as listed and agreed with the findings in the report. DG Humerus Right  Result Date: 03/06/2022 CLINICAL DATA:  Postop EXAM: RIGHT HUMERUS - 2+ VIEW COMPARISON:  02/10/2022 bone survey FINDINGS: Interval intramedullary rod with proximal and distal screw fixation of right humerus for fracture involving midshaft of humerus, this is associated with lucent bone lesion and would be consistent with pathologic fracture. Anatomic alignment. Soft tissue calcifications versus linear osseous fragments at the site of fracture. IMPRESSION: Interval surgical fixation of pathologic mid humerus fracture with anatomic alignment. Electronically Signed   By: Donavan Foil M.D.   On: 03/06/2022 21:18   DG Humerus Right  Result Date: 03/06/2022 CLINICAL DATA:  Known right humeral fracture EXAM: RIGHT HUMERUS - 2+ VIEW COMPARISON:  02/28/2022 FLUOROSCOPY TIME:  Radiation Exposure Index (as provided by the fluoroscopic device): 5.52 mGy If the device does not provide the exposure index: Fluoroscopy Time:  1 minute 40 seconds Number of Acquired Images:  8 FINDINGS: The previously seen right humeral fractures again noted. Medullary rod is now seen within the humerus with proximal and distal fixation screws. There is again noted a overall lytic lesion at the level of fracture consistent with pathologic fracture. No other focal abnormality is seen. IMPRESSION: ORIF of right humeral fracture secondary to pathologic fracture. Electronically Signed   By: Inez Catalina M.D.   On: 03/06/2022 19:54   DG C-Arm 1-60 Min-No Report  Result Date: 03/06/2022 Fluoroscopy was utilized by the requesting physician.  No radiographic interpretation.   DG C-Arm 1-60 Min-No Report  Result Date: 03/06/2022 Fluoroscopy was utilized by the requesting physician.  No radiographic interpretation.   DG C-Arm  1-60 Min-No Report  Result Date: 03/06/2022 Fluoroscopy was utilized by the requesting physician.  No radiographic interpretation.   XR Sacrum/Coccyx  Result Date: 03/18/2022 AP lateral radiographs sacrum and coccyx were reviewed.  No acute fracture.  Bony contours intact.  No  compression fractures or deformity present  XR Humerus Right  Result Date: 03/18/2022 Multiple views right humerus reviewed.  Intramedullary nail transfixes pathologic humeral shaft fracture in the midshaft region.  No new fracture is present.  Hardware positioning unchanged from immediate postop and intraoperative images.  XR Humerus Right  Result Date: 02/28/2022 AP and lateral views of right humerus reviewed.  Midshaft humerus fracture again noted in good alignment with very little angulation.  Fracture seems slightly more distracted than previous radiographs.  No callus formation.

## 2022-03-26 ENCOUNTER — Ambulatory Visit: Payer: PPO | Admitting: Hematology and Oncology

## 2022-03-26 ENCOUNTER — Other Ambulatory Visit: Payer: PPO

## 2022-04-02 ENCOUNTER — Ambulatory Visit: Payer: PPO | Admitting: Hematology and Oncology

## 2022-04-02 ENCOUNTER — Other Ambulatory Visit: Payer: PPO

## 2022-04-03 DEATH — deceased

## 2022-04-06 ENCOUNTER — Encounter: Payer: Self-pay | Admitting: Oncology

## 2022-04-06 NOTE — Progress Notes (Signed)
[facility]  [address] [place],  [place]  [address] [phone]  Clinic Day: Mar 17, 2022  Referring physician: Bonnita Nasuti, MD   ASSESSMENT & PLAN:   Multiple myeloma This is an IgG kappa but appears quite aggressive with an IgG level increased to 7797 on April 26 an M spike of 6.2.  I am very concerned that his IgG level was over 4 g last month at [facility] health and now is nearly 8 g, so he is progressing rapidly.  The beta-2 microglobulin is 10.9 and he has 70% involvement of the bone marrow.  His prognosis is quite guarded but this disease usually does respond to treatment.  We have been unable to even discuss a treatment regimen as he has not been stable enough.  Unfortunately the FISH analysis has returned and shows monosomy 13 as well as duplication of 1 q. and trisomy 56.  These chromosome abnormalities are all Pham consistent with an adverse outcome, and this is also true when there are multiple chromosome aberrations.  Severe anemia  He has required at least 3 transfusions now.  Iron studies in the lab were normal but iron stores in the bone marrow are low.  B12 was low normal at 270 and folate was normal.  Retake count was 1.8% and he had a normal MMA and normal copper level.  I feel it is primarily caused by his multiple myeloma.    Renal insufficiency This is now severe with an EGFR estimated at 30 and creatinine up to 2.2.  He has mild proteinuria with a small M spike in the urine.  Chronic orthostatic hypotension He has had this for years due to autonomic dysfunction.  He has been managed by the cardiologist with midodrine and they did try Florinef but that has been stopped.  He will require some steroids as part of his myeloma regimen and hopefully this will help.  He did have a near syncopal episode here in the office today.  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  His white count is stable at 13,000 with 60% lymphocytes.  His hemoglobin is 8.3 and his platelet count is normal. Hypercalcemia This is mild but I expect it will increase and so I described the symptoms to expect.  His calcium is 11.5 today.  He will need to receive zoledronic acid or denosumab.  Severe anxiety and panic attacks Dr. Jannette Pham prescribed alprazolam 0.5 mg but he and his wife say that has not helped, so I will prescribe lorazepam 1 mg to use every 4 hours as needed.  He does appear quite anxious today.  He is already on Prozac and bupropion.  Mild hyponatremia This appears to be chronic and stable.   There was much to discuss regarding his diagnosis, prognosis and treatment plan.  I do not feel he could tolerate oral chemotherapy at this time and likely never well.  He has stage III aggressive multiple myeloma and is deteriorating rapidly.  We will try placing him on dexamethasone at 4 mg daily and plan to initiate a higher pulse of 20 mg once weekly if he tolerates this.  I went through as much information as they could tolerate and discussed the reasons that I do not feel he is a candidate for aggressive therapy.  We will monitor his blood counts.  I expect he will need  transfusions again.  He had a rod placed in his humerus and is having extreme pain.  I agreed to refill his oxycodone and told him he could use 1-2 every 4 hours as needed.  I also refilled his Zofran 4 mg every 4 hours as needed we will have him try increasing the Wellbutrin to 150 mg twice daily, and we will add omeprazole 40 mg daily.  He will require close follow-up, probably weekly with labs weekly.  I will see him back in 1 week with CBC, CMP and type and hold.  I discussed the assessment and treatment plan with the patient and his family.  They were provided an opportunity to ask questions and all were answered.  The patient agreed with the plan and demonstrated an  understanding of the instructions.  The patient was advised to call back if the symptoms worsen or if the condition fails to improve as anticipated.  I provided 35 minutes of face-to-face time during this this encounter and > 50% was spent counseling as documented under my assessment and plan.    Lance Kaplan, MD [facility] [address] [place] [address] Dept: [phone] Dept Fax: [phone]    CHIEF COMPLAINT:  CC: Multiple myeloma  Current Treatment: Evaluation for chemotherapy   HISTORY OF PRESENT ILLNESS:  Lance Pham is a 71 y.o. male with a history of severe orthostatic hypotension due to autonomic dysfunction who is referred in consultation with Dr. Shirlee Pham for assessment and management.  He was hospitalized at [facility] health on April 9 and found to have a fracture of the humerus which appeared to be pathologic in nature.  Skeletal survey did not reveal any other lytic lesions but he was found to have elevated protein, hypercalcemia, renal dysfunction, and anemia.  CT scan of chest abdomen and pelvis revealed tiny pulmonary nodules of the left upper lobe and a 14 mm nodule of the left adrenal gland which was stable.  His hemoglobin dropped as low as 7.8 and he was transfused with 1 unit of packed red blood cells.  He showed up to Dr. Joya Pham office on April 25 with severe hypotension, with a systolic blood pressure of 80 and extreme weakness.  He was sent to the emergency room and found to be severely anemic once again with a hemoglobin of 6.6 and transfused with another unit of packed red blood cells.  He continues to feel very weak and has severe pain of the right shoulder which is in an immobilization sling.  While in [facility], he was found to have a total protein up to 10.6 with a calcium of 9.9, creatinine 1.5 and a white count of 12.4 with 79% lymphocytes.  A bone marrow was performed  while he was in the hospital and he is here now with his wife for those results.  During his prior hospitalization at [facility], urine protein electrophoresis revealed 840 mg of protein over 24 hours with 48% of that consistent with Bence-Jones proteins for an M spike in the urine of 400 mg.  He did have evaluation of his anemia and iron studies were normal folate was normal B12 was low normal at 270.  INTERVAL HISTORY:  I have reviewed his chart and materials related to his cancer extensively and collaborated history with the patient. Summary of oncologic history is as follows: Oncology History  Multiple myeloma ([place])  02/25/2022 Initial Diagnosis   Multiple myeloma ([place])  03/05/2022 Cancer Staging   Staging form: Plasma Cell Myeloma and Plasma Cell Disorders, AJCC 8th Edition - Clinical stage from 03/05/2022: RISS Stage III (Beta-2-microglobulin (mg/L): 10.9, Albumin (g/dL): 3.2, ISS: Stage III, High-risk cytogenetics: Present) - Signed by Lance Kaplan, MD on 03/24/2022 Histopathologic type: Multiple myeloma Stage prefix: Initial diagnosis Beta 2 microglobulin range (mg/L): Greater than or equal to 5.5 Albumin range (g/dL): Less than 3.5 Cytogenetics: 1q addition, 17p deletion Diagnostic confirmation: Positive histology PLUS positive immunophenotyping and/or positive genetic studies Specimen type: Aspirate Serum calcium level: Elevated Pre-operative calcium level (mg/dL): 11 Creatinine (mg/dL): 1.5 Bone disease on imaging: Present Number of bone lesions identified on imaging: 1 Hemoglobin (Hgb) (g/dL): 8.7 Pretreatment IgG (mg/dL): 7,797 Pretreatment monoclonal protein level in 24-hour urine (M spike) (g): 0.4 Prognostic indicators: Monosomy 13, Trisomy 17, duplication of 1q Stage used in treatment planning: Yes National guidelines used in treatment planning: Yes Type of national guideline used in treatment planning: NCCN     Lance Pham is seen in the clinic for follow up of his newly  diagnosed multiple myeloma, IgG kappa.  We now know this is a stage III and he has aggressive cytogenetic changes consistent with a very poor prognosis.  He has not been stable enough to start any therapy and is steadily declining.  He still feels extremely weak and has no appetite.  He had a rod placed in his right humerus by orthopedic surgeon and his arm is in a sling to immobilize it.  He also has a second malignancy, chronic lymphocytic leukemia, and I have explained this to them.  Storage iron is diminished to absent by iron stains in the bone marrow.  He has a performance status of 2 and spends much of his time in bed or in the recliner.  Today's labs reveal a hemoglobin of 8. 3, , normal platelet count of 196,000, white count is elevated at 13.3, with 60% lymphocytes.  Comprehensive metabolic profile reveals a creatinine up to 2.2, calcium of 12.1, and total protein now increased to 13.0.  He continues to have elevation of his SGOT, to 151 and SGPT to 58. He has mild hyponatremia with a sodium of 130.  I had hoped he would be a candidate for a 3 drug regimen of Revlimid, dexamethasone, and either daratumumab, but he has deteriorated so quickly that he is not a candidate for anything other than palliative care.  I do not feel he is well enough to start it now but we did discuss placing him on dexamethasone.  This would have some mealtime myeloma effect and possibly help his appetite and sense of wellbeing.  However I do not know if this would further aggravate his anxiety and panic attacks.  He will need close support and weekly follow-up. He denies fever, chills, night sweats, or other signs of infection. He denies cardiorespiratory and gastrointestinal issues. He  denies pain. His appetite is poor and he is losing weight.  He has severe fatigue, weakness and dizziness, with right shoulder pain, hypotension and tachypnea. HISTORY:   Past Medical History:  Diagnosis Date   Acute intermittent porphyria  ([facility]) 12/04/2020   Allergic rhinitis 12/04/2020   Arthritis    RIGHT knee   Benign prostatic hyperplasia with lower urinary tract symptoms 12/04/2020   Binge eating disorder 12/04/2020   CAD in native artery 06/22/2019   Clotting disorder [facility])    has a genetic clotting dx -unknown name   Cutaneous abscess 12/04/2020   Dyslipidemia 07/14/2018  Essential hypertension 07/14/2018   Gastro-esophageal reflux disease without esophagitis 12/04/2020   Generalized anxiety disorder 12/04/2020   Hyperprolactinemia ([place]) 06/02/2017   Hypothyroidism 12/04/2020   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 3 mm pituitary microadenoma. Likely nonfunctional.   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   Vitamin D deficiency 12/04/2020  Anxiety and panic attacks Pathologic fracture of the right humerus History of COVID-19 infection last year Osteoarthritis 9  Past Surgical History:  Procedure Laterality Date   CATARACT EXTRACTION, BILATERAL     CHOLECYSTECTOMY     COLONOSCOPY  2017   RG-mag citrtae (good)-TICS/TA x 1   FOOT SURGERY Left    tendon link   HAMMER TOE SURGERY     HUMERUS IM NAIL Right 03/06/2022   Procedure: RIGHT INTRAMEDULLARY (IM) NAIL HUMERAL;  Surgeon: Meredith Pel, MD;  Location: [facility];  Service: Orthopedics;  Laterality: Right;   KNEE SURGERY Right    LAPAROSCOPIC GASTRIC BANDING     POLYPECTOMY  2017   TA x 1   WISDOM TOOTH EXTRACTION     WRIST RECONSTRUCTION Right      Family History  Problem Relation Age of Onset   Colon cancer Mother 37   Colon polyps Mother 55   Thyroid cancer Father 50       parathyroid CA   Colon polyps Sister    Colon polyps Brother    Colon polyps Sister    Colon polyps Sister    Colon polyps Sister    Esophageal cancer Neg Hx    Stomach cancer Neg Hx    Rectal cancer Neg Hx     Social History:  reports that he has never smoked. He has never used smokeless tobacco. He reports that he does not drink alcohol and does not use drugs.The patient is married and accompanied by his wife today.  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  Allergies:  Allergies  Allergen Reactions   Nsaids Other (See Comments)    Patient had a Lap band placed is suppose to have NO NSAIDS due to the possibility of esophageal erosion leading to band "coming through"   Tamiflu [Oseltamivir] Nausea Only and Other (See Comments)    Stomach upset Per patient never had a problem    Current Medications: Current Outpatient Medications  Medication Sig Dispense Refill   acetaminophen (TYLENOL) 500 MG tablet Take 1,000 mg by mouth every 6 (six) hours as needed for mild pain or headache.     apixaban (ELIQUIS) 5 MG TABS tablet Take 5 mg by mouth 2 (two) times daily.     benzonatate (TESSALON) 200 MG capsule Take 200 mg by mouth 3 (three) times daily as needed for cough.     Blood Glucose Monitoring Suppl (ONE TOUCH ULTRA 2) w/Device KIT daily.     buPROPion (WELLBUTRIN SR) 150 MG 12 hr tablet Take 150 mg by mouth at bedtime.     cyanocobalamin (,VITAMIN B-12,) 1000 MCG/ML injection Inject 1 mL (1,000 mcg total)  into the muscle every 30 (thirty) days. 1 mL 0   dexamethasone (DECADRON) 4 MG tablet Take 5 tablets (20 mg total) by mouth once a week. 20 tablet 3   ferrous sulfate 324 (65 Fe) MG TBEC Take 324 mg by mouth daily after supper.     fludrocortisone (FLORINEF) 0.1 MG tablet Take 100 mcg by mouth daily.     FLUoxetine (PROZAC) 40 MG capsule Take  40 mg by mouth at bedtime.     FOLIC ACID PO Take 454 mcg by mouth at bedtime.     LORazepam (ATIVAN) 1 MG tablet Take 1 tablet (1 mg total) by mouth every 4 (four) hours as needed for anxiety. 30 tablet 0   methocarbamol (ROBAXIN) 500 MG tablet Take 1 tablet (500 mg total) by mouth every 6 (six) hours as needed for muscle spasms. 30 tablet 0   midodrine (PROAMATINE) 10 MG tablet Take 10 mg by mouth 3 (three) times daily.     ondansetron (ZOFRAN) 8 MG tablet Take 1 tablet (8 mg total) by mouth daily as needed for nausea or vomiting. 90 tablet 0   oxyCODONE (OXY IR/ROXICODONE) 5 MG immediate release tablet Take 1-2 tablets (5-10 mg total) by mouth every 4 (four) hours as needed for moderate pain (pain score 4-6). 120 tablet 0   polyethylene glycol (MIRALAX / GLYCOLAX) 17 g packet Take 17 g by mouth daily as needed for mild constipation or moderate constipation. 14 each 0   pyridostigmine (MESTINON) 60 MG tablet Take 0.5 tablets (30 mg total) by mouth 2 (two) times daily. 30 tablet 0   senna-docusate (SENOKOT-S) 8.6-50 MG tablet Take 2 tablets by mouth at bedtime as needed for mild constipation or moderate constipation. 10 tablet 0   tamsulosin (FLOMAX) 0.4 MG CAPS capsule Take 0.4 mg by mouth at bedtime.     No current facility-administered medications for this visit.    REVIEW OF SYSTEMS:  Review of Systems  Constitutional:  Positive for appetite change and fatigue.  HENT:  Negative.    Eyes: Negative.   Respiratory:  Positive for shortness of breath.   Cardiovascular:  Positive for leg swelling.  Gastrointestinal: Negative.   Genitourinary:  Positive for difficulty urinating, frequency and nocturia.   Musculoskeletal:  Positive for arthralgias and back pain.  Skin: Negative.   Neurological:  Positive for dizziness, extremity weakness and light-headedness.  Hematological:  Bruises/bleeds easily.  Psychiatric/Behavioral:  Positive for depression and sleep disturbance. The patient is  nervous/anxious.      VITALS:  Blood pressure 126/73, pulse 71, temperature 98.2 F (36.8 C), resp. rate 18, weight 253 lb 6.4 oz (114.9 kg), SpO2 91 %.  Wt Readings from Last 3 Encounters:  03/18/22 253 lb 6.4 oz (114.9 kg)  03/06/22 261 lb (118.4 kg)  02/25/22 263 lb (119.3 kg)    Body mass index is 32.53 kg/m.  Performance status (ECOG): 2 - Symptomatic, <50% confined to bed  PHYSICAL EXAM:  Physical Exam Vitals and nursing note reviewed.  Constitutional:      Appearance: He is ill-appearing.  HENT:     Head: Normocephalic and atraumatic.     Nose: Nose normal.     Mouth/Throat:     Pharynx: Oropharynx is clear.  Eyes:     Extraocular Movements: Extraocular movements intact.     Conjunctiva/sclera: Conjunctivae normal.     Pupils: Pupils are equal, round, and reactive to light.  Cardiovascular:     Rate and Rhythm: Normal rate and regular  rhythm.     Heart sounds: Normal heart sounds.  Pulmonary:     Breath sounds: Normal breath sounds.     Comments: tachypnea Abdominal:     General: Bowel sounds are normal.     Palpations: Abdomen is soft.  Genitourinary:    Penis: Normal.   Musculoskeletal:        General: Signs of injury present.     Cervical back: Normal range of motion.     Comments: Right arm is in a sling due to fracture of the upper humerus  Skin:    General: Skin is dry.     Coloration: Skin is pale.     Findings: Bruising present.     Comments: Decreased skin turgor turgor  Neurological:     General: No focal deficit present.     Motor: Weakness present.  Psychiatric:     Comments: He appears anxious and restless.  He is having a panic attack while here.    LABS:      Latest Ref Rng & Units 03/18/2022   12:00 AM 03/07/2022    1:12 AM 03/06/2022   10:26 PM  CBC  WBC  13.3      17.3   16.0    Hemoglobin 13.5 - 17.5 8.3      8.2   8.4    Hematocrit 41 - 53 26      26.6   26.3    Platelets 150 - 400 K/uL 196      145   148       This result is  from an external source.      Latest Ref Rng & Units 03/18/2022   12:00 AM 03/07/2022    1:12 AM 03/06/2022   10:26 PM  CMP  Glucose 70 - 99 mg/dL  134   137    BUN 4 - _0 Creatinine 0.6 - 1.3 2.2      1.49   1.48    Sodium 137 - 147 130      127   129    Potassium 3.5 - 5.1 mEq/L 4.4      5.1   5.0    Chloride 99 - 108 101      101   101    CO2 13 - _1 Calcium 8.7 - 10.7 11.3      8.9   8.8    Alkaline Phos 25 - 125 182         AST 14 - 40 151         ALT 10 - 40 U/L 58            This result is from an external source.     No results found for: CEA1 / No results found for: CEA1 No results found for: PSA1 No results found for: CAN199 No results found for: CAN125  Lab Results  Component Value Date   TOTALPROTELP POSSIBLE INTERFERING SUBSTANCE 03/05/2022   ALBUMINELP NOT PERFORMED 03/05/2022   A1GS NOT PERFORMED 03/05/2022   A2GS NOT PERFORMED 03/05/2022   BETS NOT PERFORMED 03/05/2022   GAMS NOT PERFORMED 03/05/2022   MSPIKE 4.2 (H) 02/10/2022   SPEI Comment 02/10/2022   Lab Results  Component Value Date   TIBC 343 02/09/2022   FERRITIN 332 02/09/2022   FERRITIN 319 11/22/2020  FERRITIN 312 11/21/2020   IRONPCTSAT 36 02/09/2022   Lab Results  Component Value Date   LDH 279 (H) 11/21/2020    STUDIES:  XR Sacrum/Coccyx  Result Date: 03/18/2022 AP lateral radiographs sacrum and coccyx were reviewed.  No acute fracture.  Bony contours intact.  No compression fractures or deformity present  XR Humerus Right  Result Date: 03/18/2022 Multiple views right humerus reviewed.  Intramedullary nail transfixes pathologic humeral shaft fracture in the midshaft region.  No new fracture is present.  Hardware positioning unchanged from immediate postop and intraoperative images.

## 2022-04-09 ENCOUNTER — Encounter: Payer: Self-pay | Admitting: Oncology

## 2022-04-09 ENCOUNTER — Other Ambulatory Visit: Payer: PPO

## 2022-04-09 ENCOUNTER — Ambulatory Visit: Payer: PPO | Admitting: Oncology

## 2022-04-16 ENCOUNTER — Ambulatory Visit: Payer: PPO | Admitting: Hematology and Oncology

## 2022-04-16 ENCOUNTER — Other Ambulatory Visit: Payer: PPO

## 2022-04-23 ENCOUNTER — Other Ambulatory Visit: Payer: PPO

## 2022-04-23 ENCOUNTER — Ambulatory Visit: Payer: PPO | Admitting: Oncology

## 2023-05-10 IMAGING — CR DG HUMERUS 2V *R*
2 series · 2 of 2 positions shown · non-contrast
Comparison: None.

CLINICAL DATA: Fall with pain and deformity.

EXAM:
RIGHT HUMERUS - 2+ VIEW

[humerus lat]
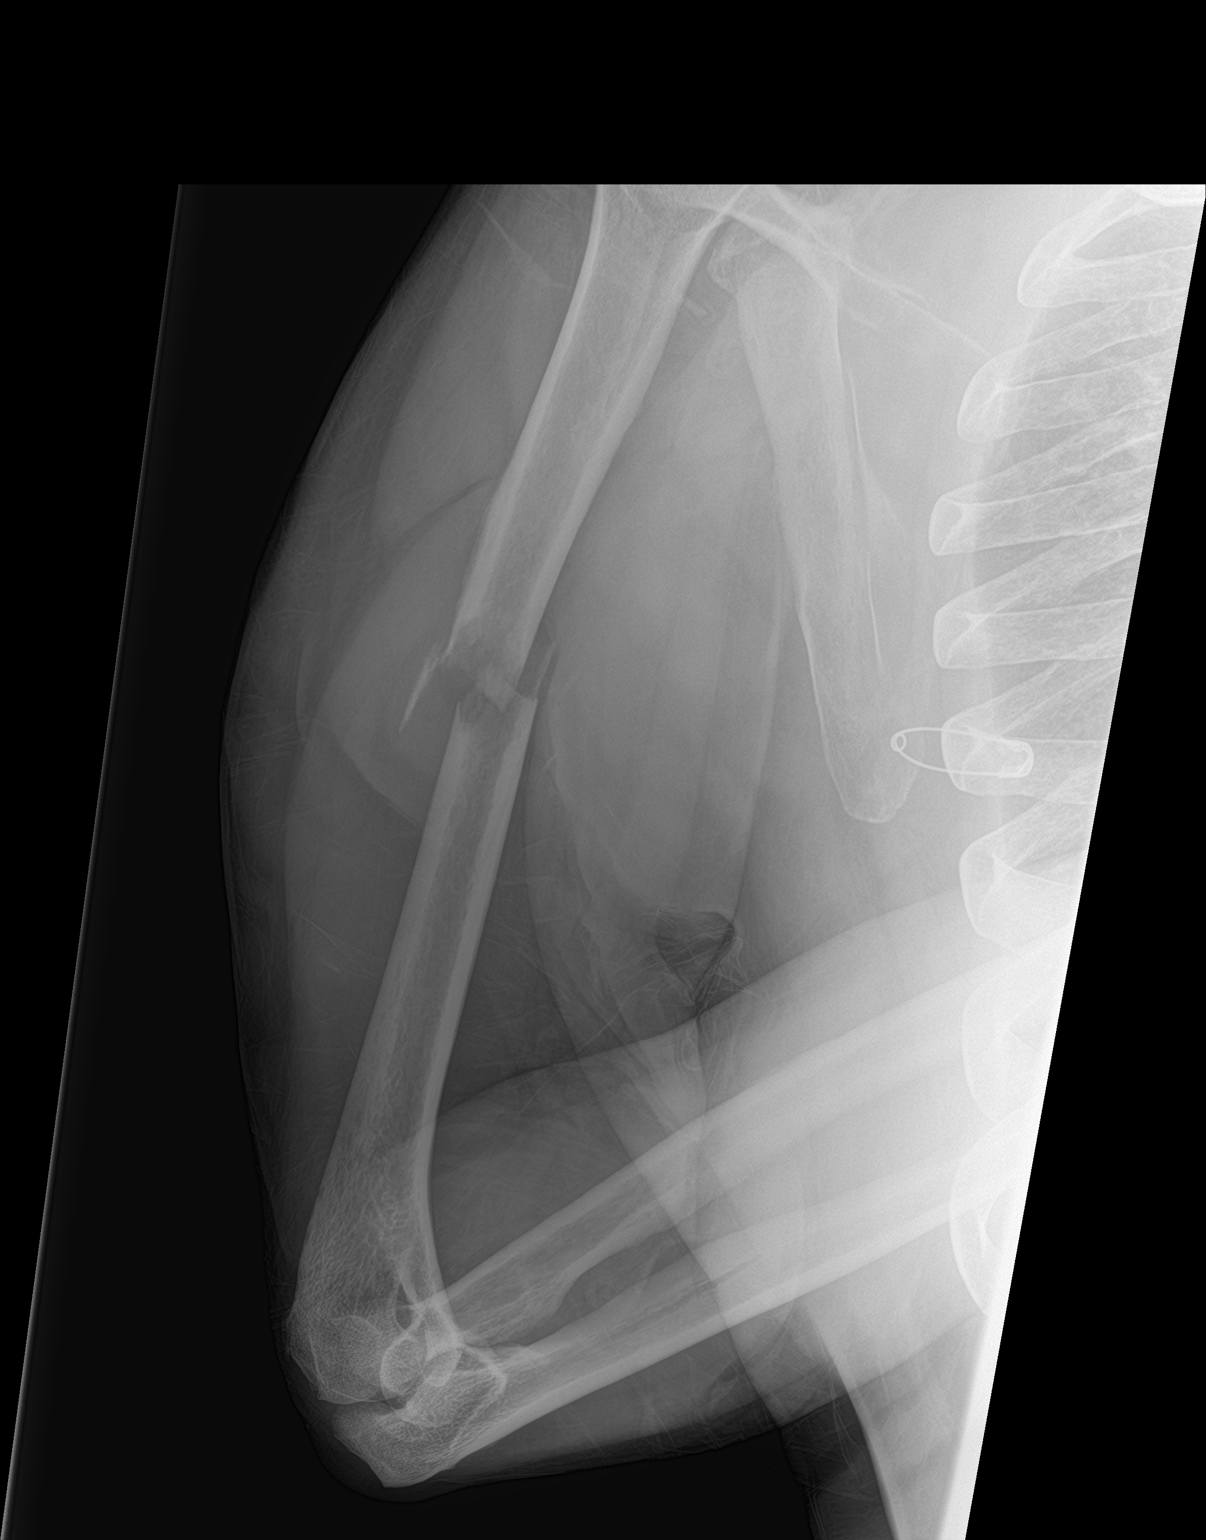

[humerus ap]
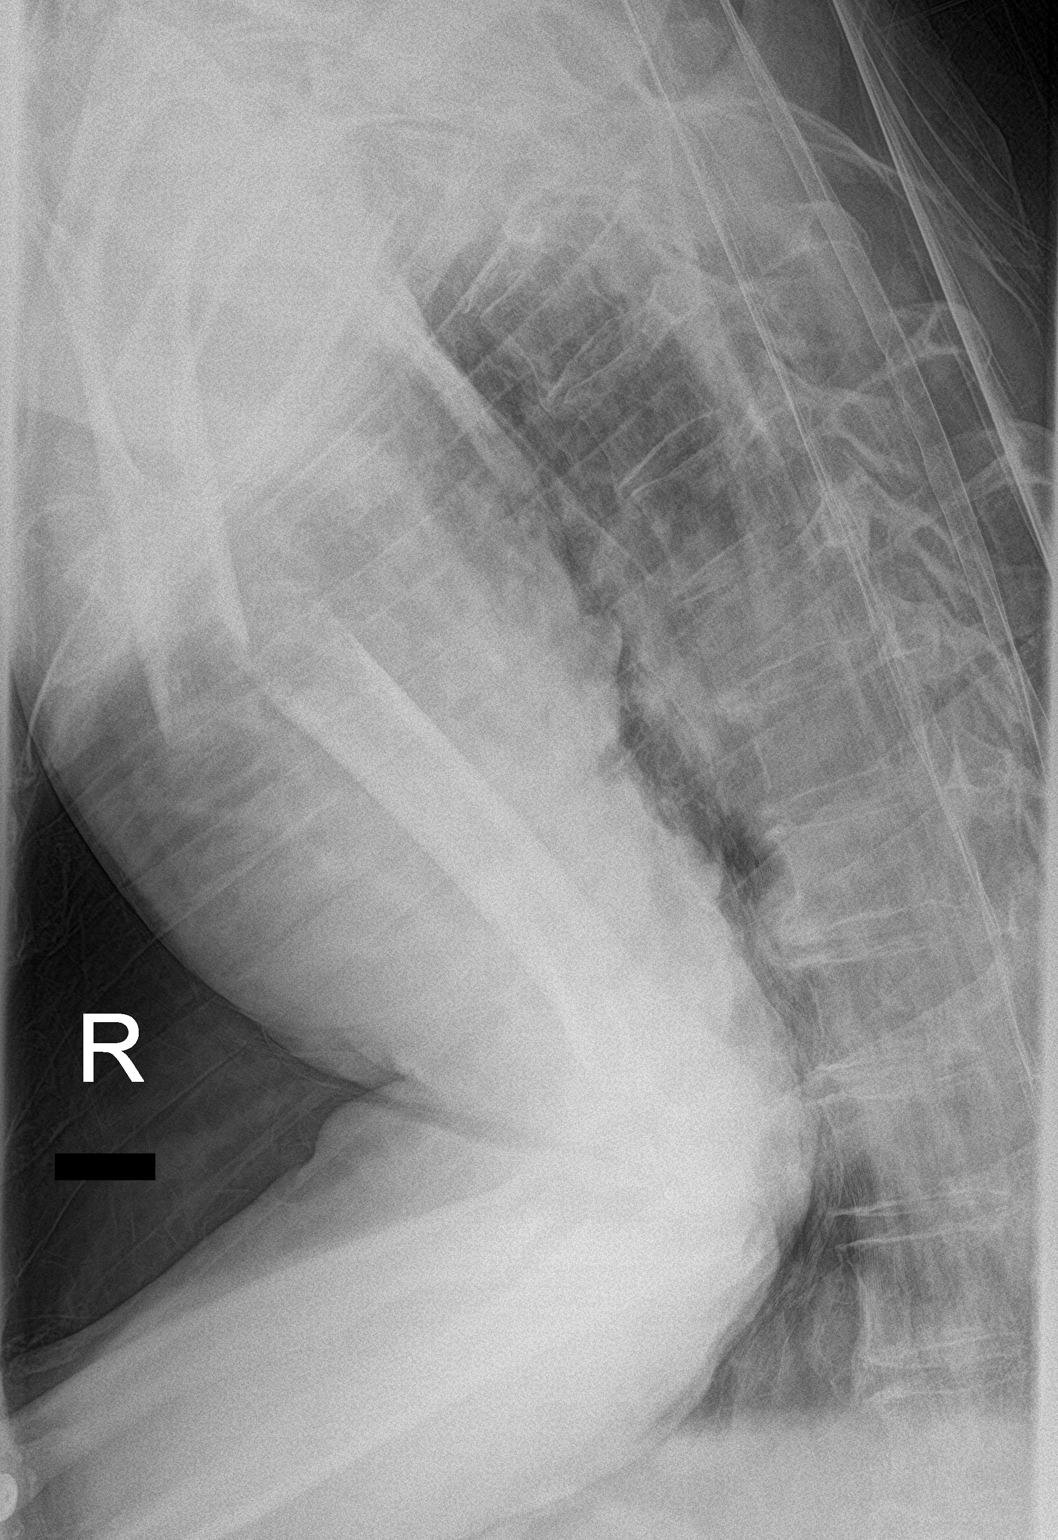

[2 of 2 positions shown; findings below may reference images not displayed]

FINDINGS: Mildly comminuted complete transverse fracture of the mid diaphysis
of the humerus with slight anterior angulation and displacement of
about 1.5 cm. Cannot completely rule out the possibility of an
underlying lytic lesion.
IMPRESSION: Mildly comminuted, angulated and displaced fracture of the mid
humeral diaphysis. Question underlying lytic abnormality, though
this is not certain.

## 2023-05-10 IMAGING — CR DG SHOULDER 2+V*R*
3 series · 3 of 3 positions shown · non-contrast
Comparison: None.

CLINICAL DATA: Fall with trauma to the right shoulder and arm.

EXAM:
RIGHT SHOULDER - 2+ VIEW

[shoulder grashey]
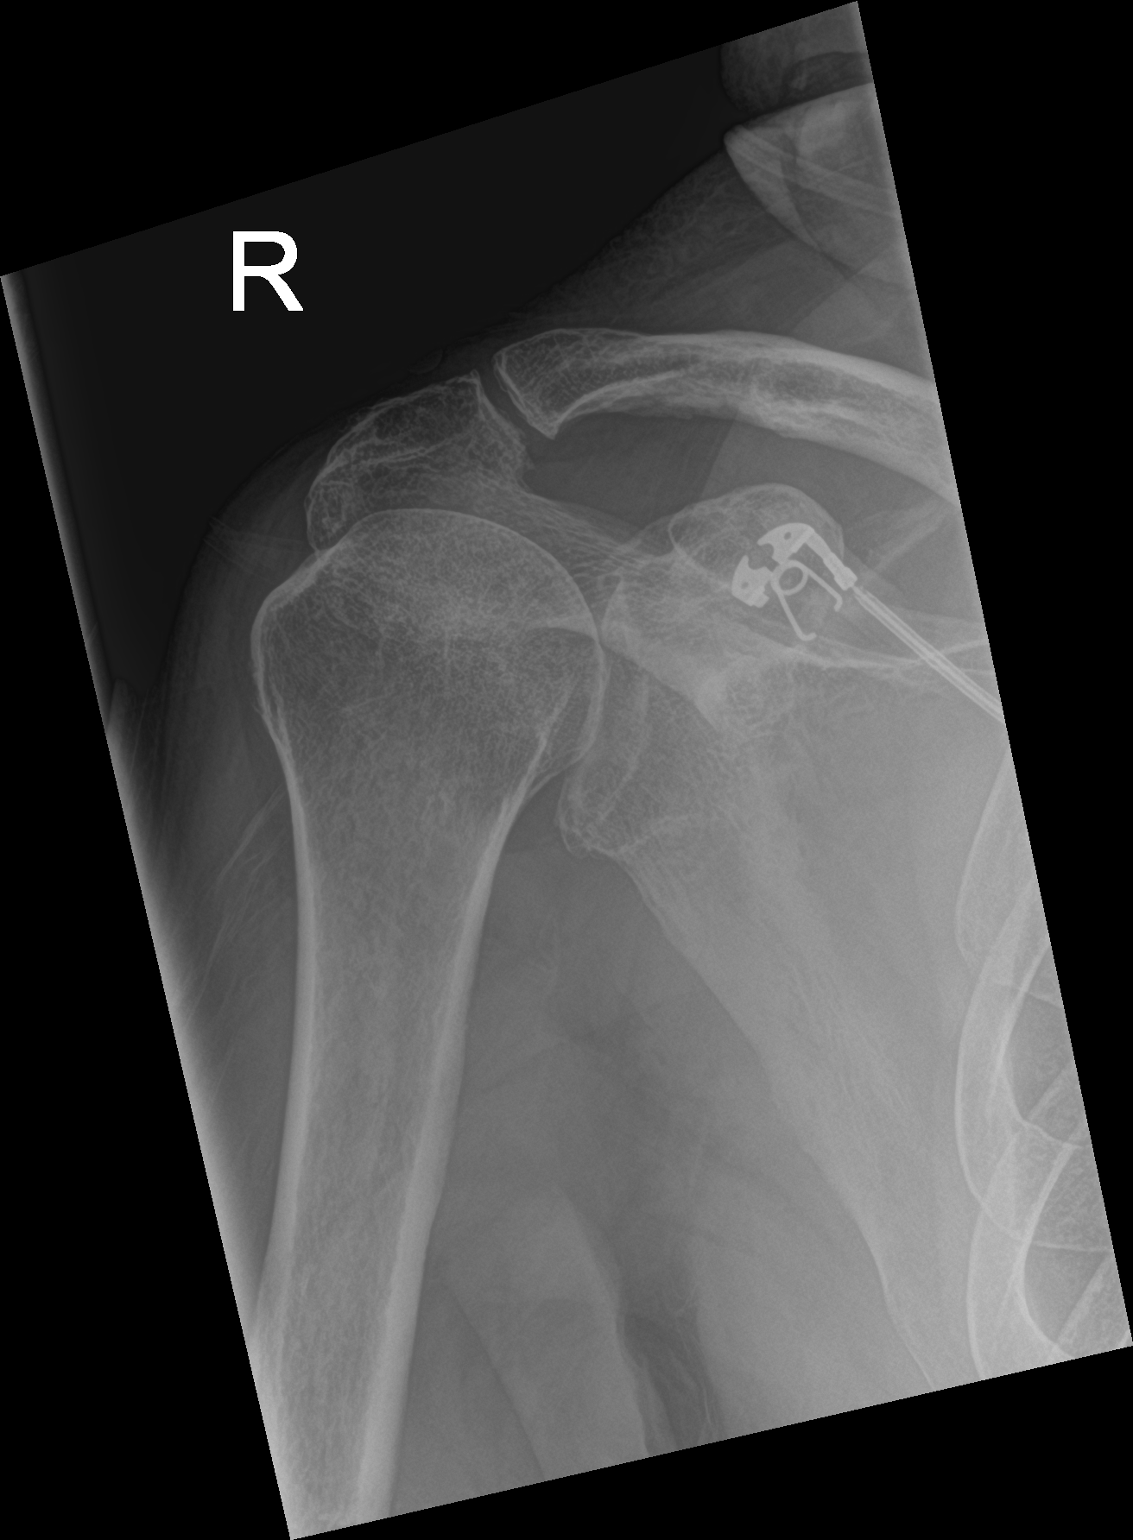

[shoulder y view]
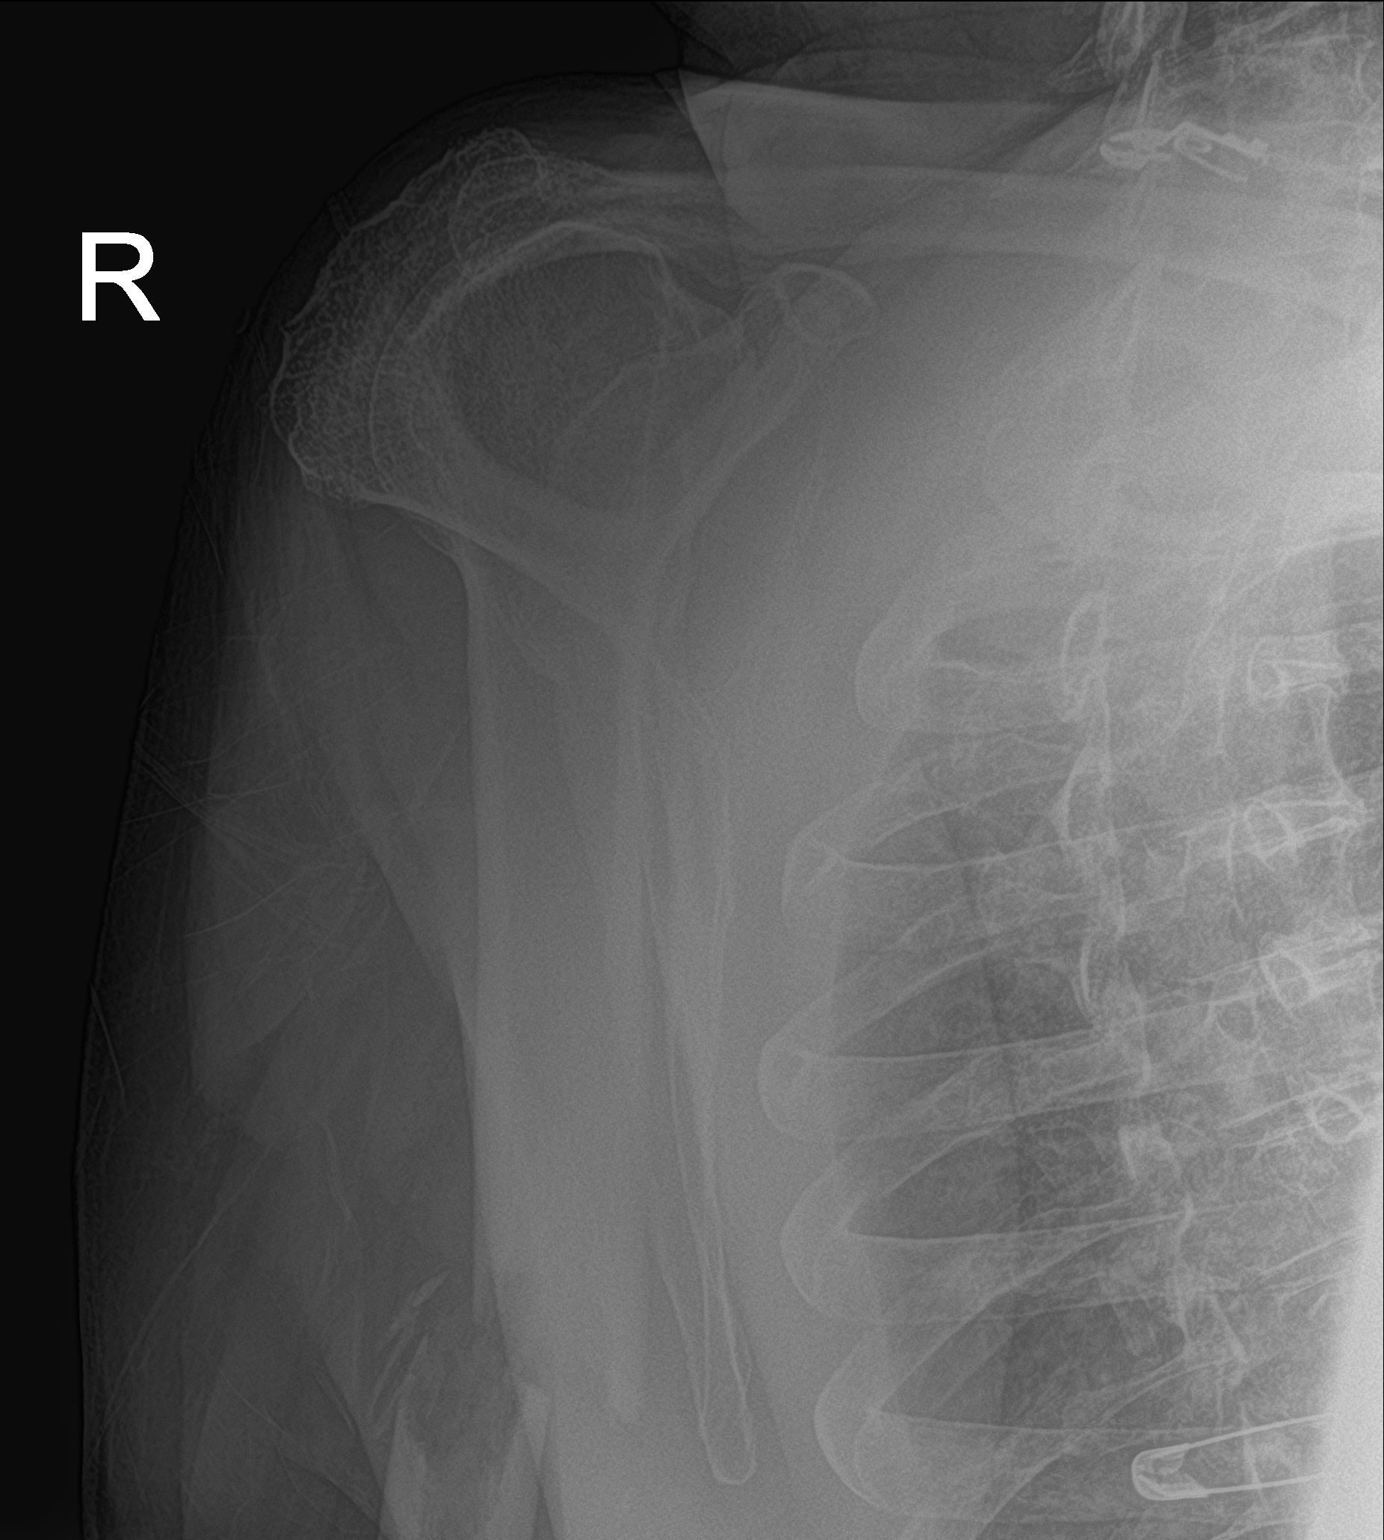

[shoulder ap neutral]
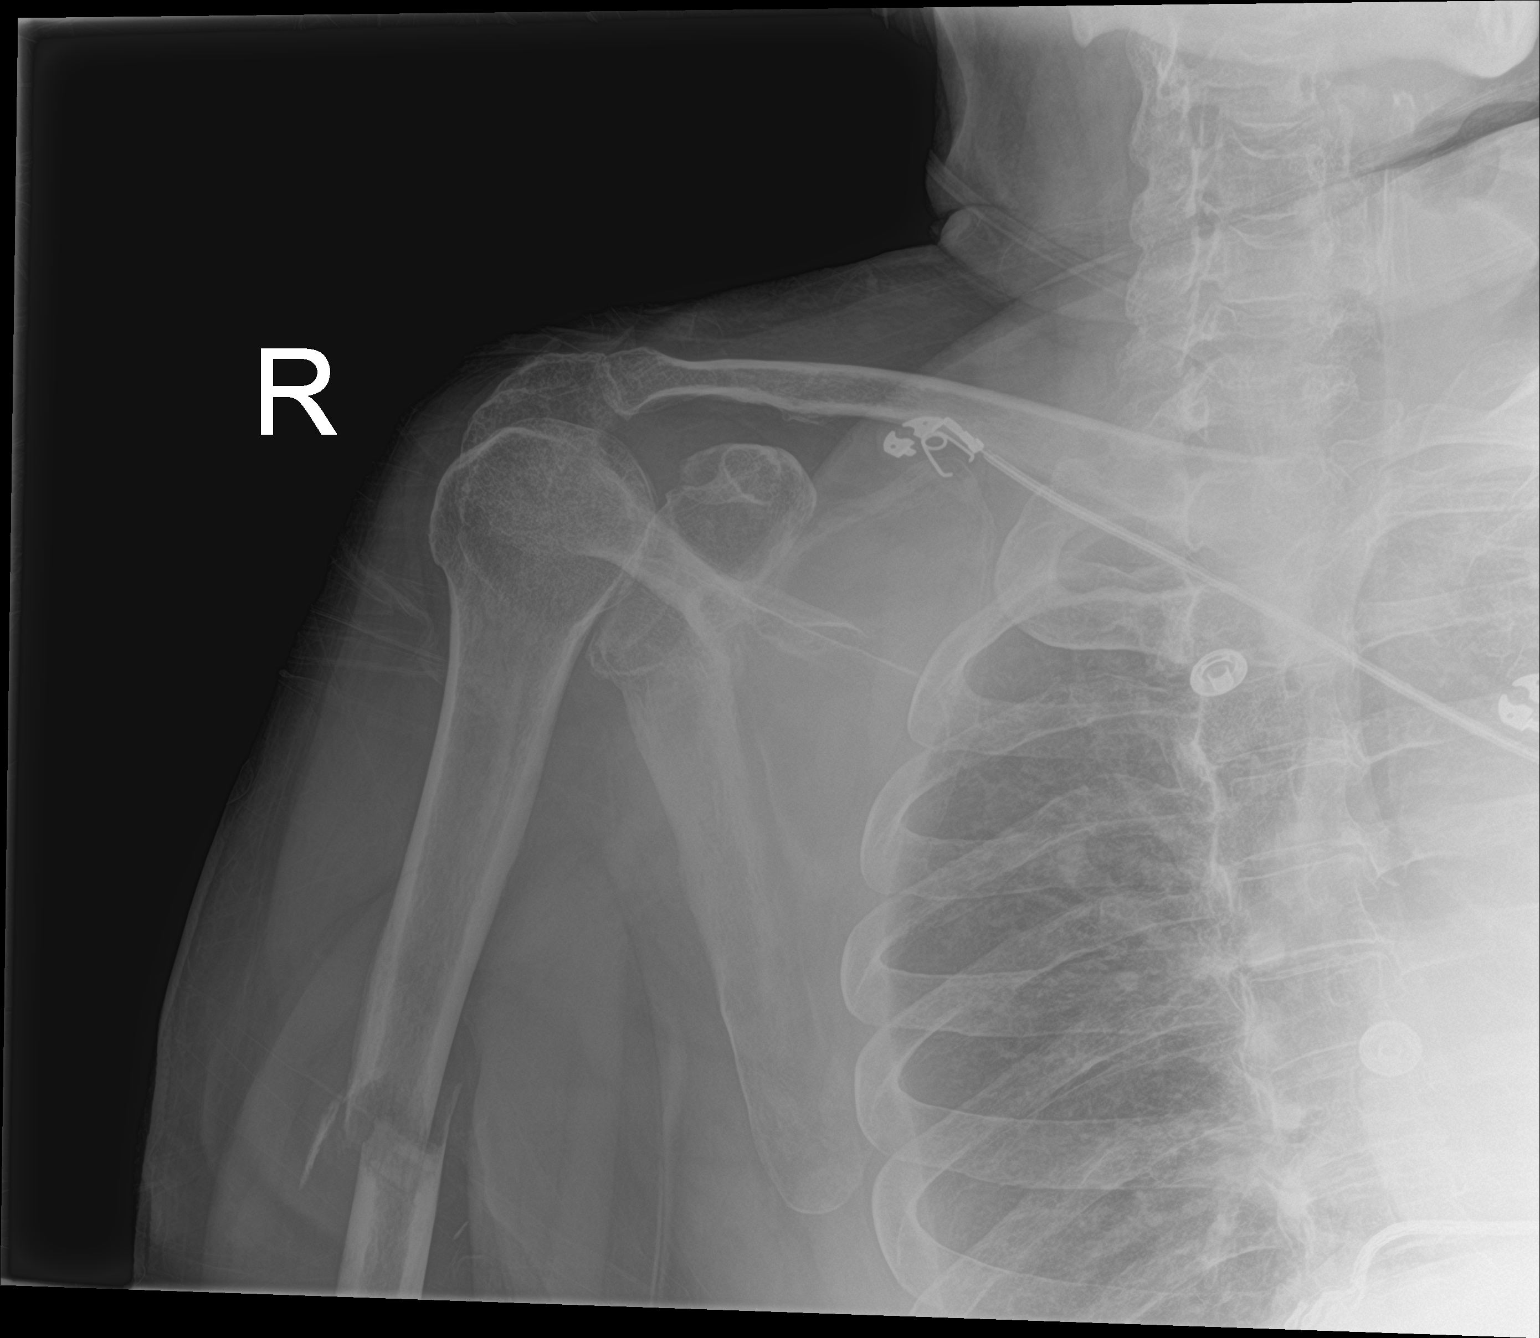

[3 of 3 positions shown; findings below may reference images not displayed]

FINDINGS: No fracture or dislocation at the shoulder joint. Humeral diaphyseal
fracture described on separate report.
IMPRESSION: Negative shoulder evaluation.  See results of humerus exam.

## 2023-05-10 IMAGING — CT CT HEAD W/O CM
4 series · 16 of 47 positions shown, 18 images · non-contrast
Comparison: Head CT 11/21/2020.

CLINICAL DATA: Patient fell from standing.



[Series 3: head without · axial · non-contrast · 0.46mm/px · z∈[-63,+57]mm · 7 of 34 slices shown, 9 images]
[im 5/34  brain]
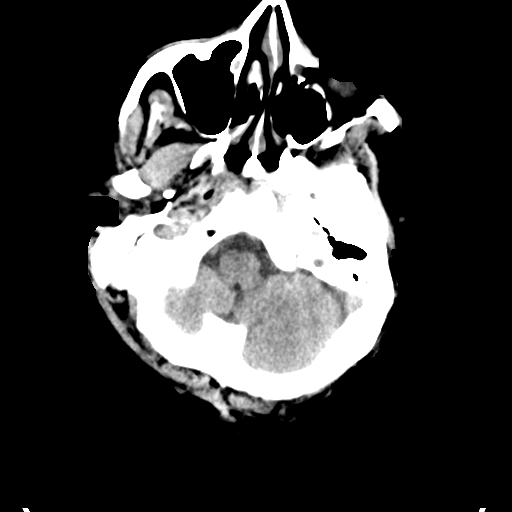
[im 5/34  bone]
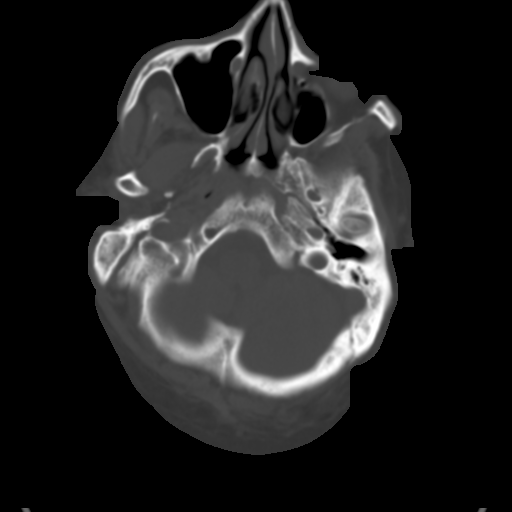
[im 9/34  brain]
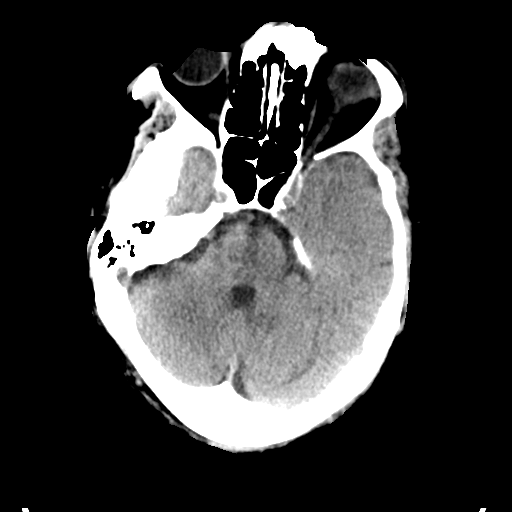
[im 13/34  brain]
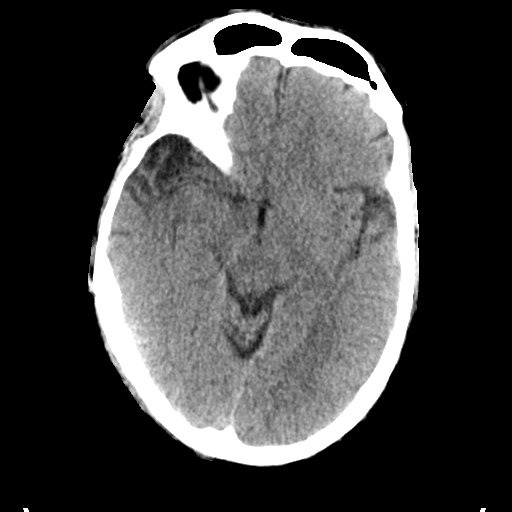
[im 17/34  brain]
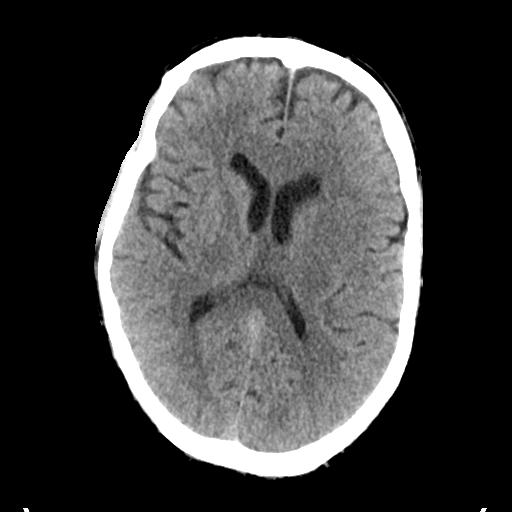
[im 21/34  brain]
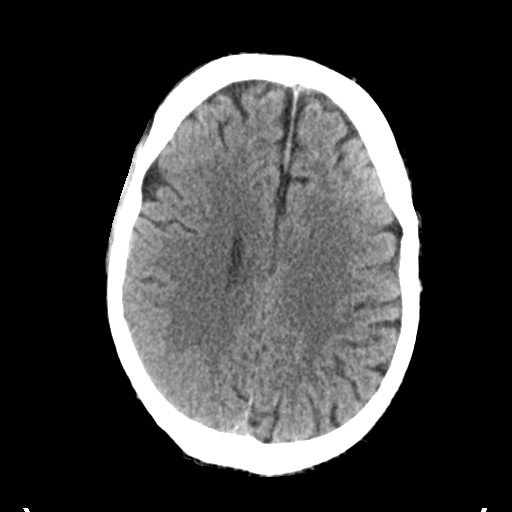
[im 21/34  bone]
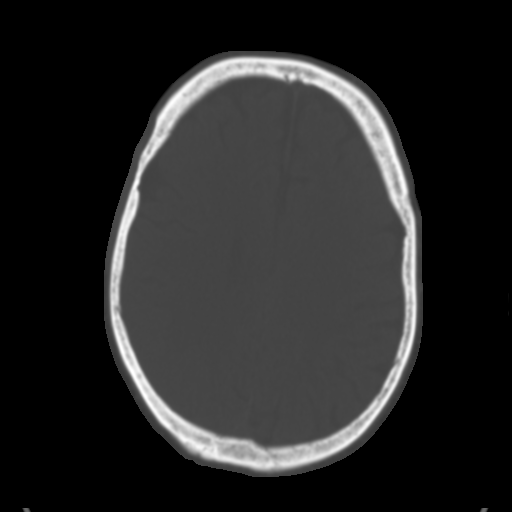
[im 25/34  brain]
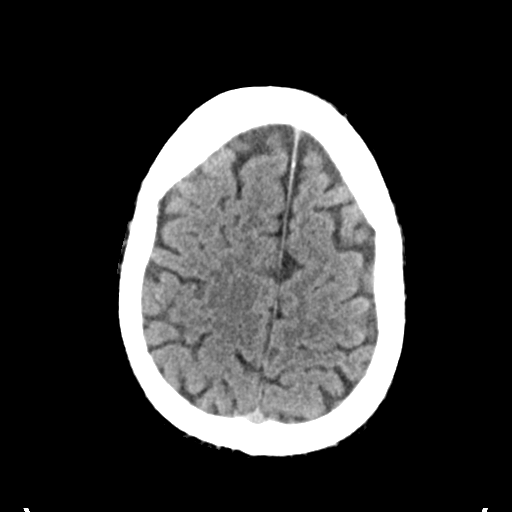
[im 29/34  brain]
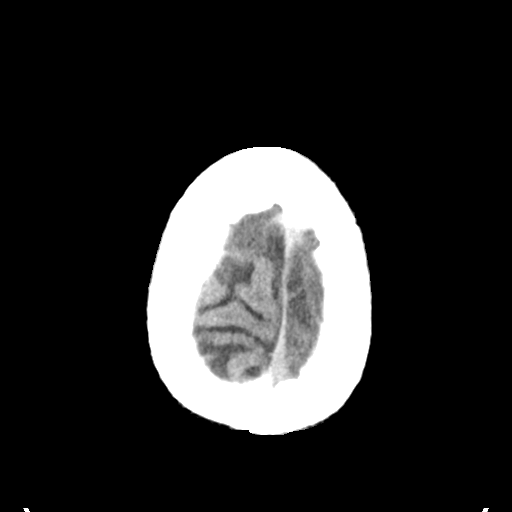

[Series 4: head bone · axial · 0.46mm/px · z∈[-67,-33]mm · 3 of 85 slices shown]
[im 9/85  bone]
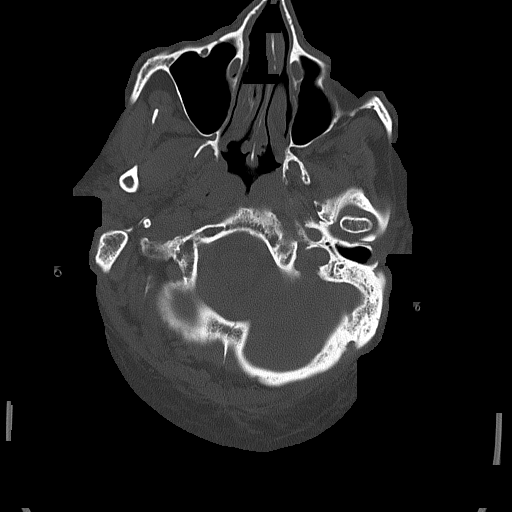
[im 17/85  bone]
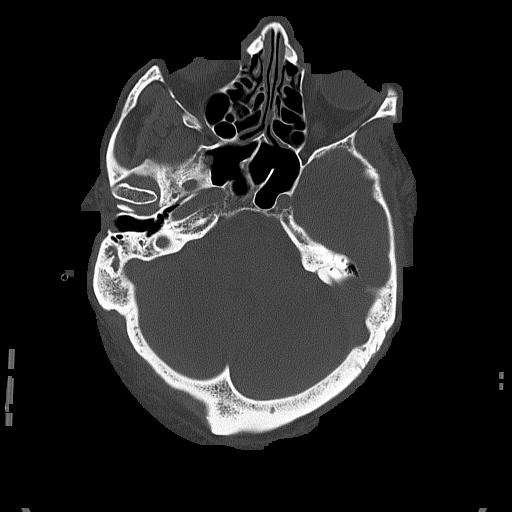
[im 26/85  bone]
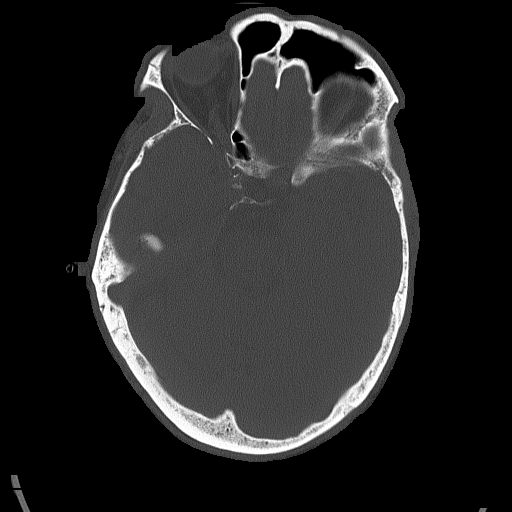

[Series 5: head without cor · coronal · non-contrast · 0.34mm/px · 3 of 83 slices shown]
[im 28/83  brain]
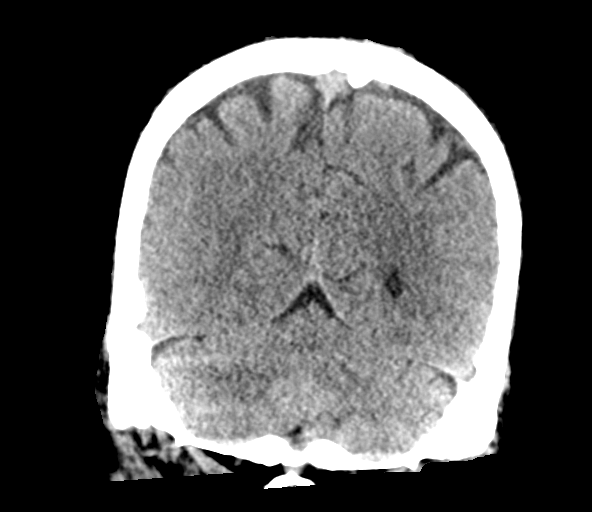
[im 37/83  brain]
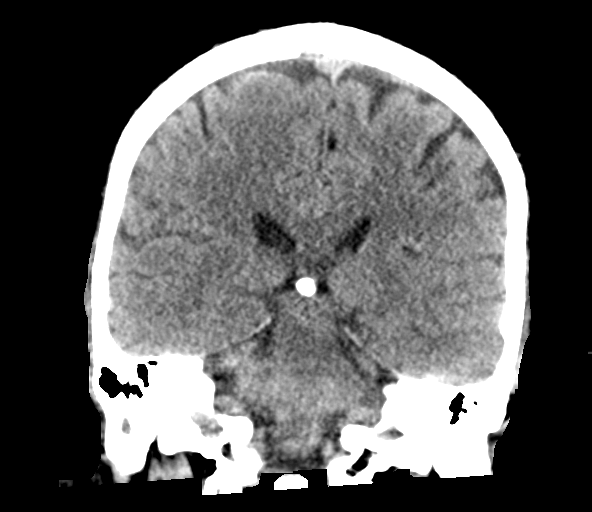
[im 46/83  brain]
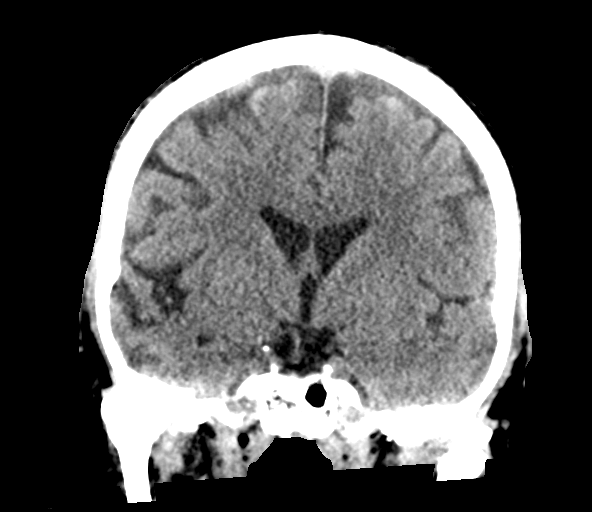

[Series 6: head without sag · sagittal · non-contrast · 0.38mm/px · 3 of 66 slices shown]
[im 26/66  brain]
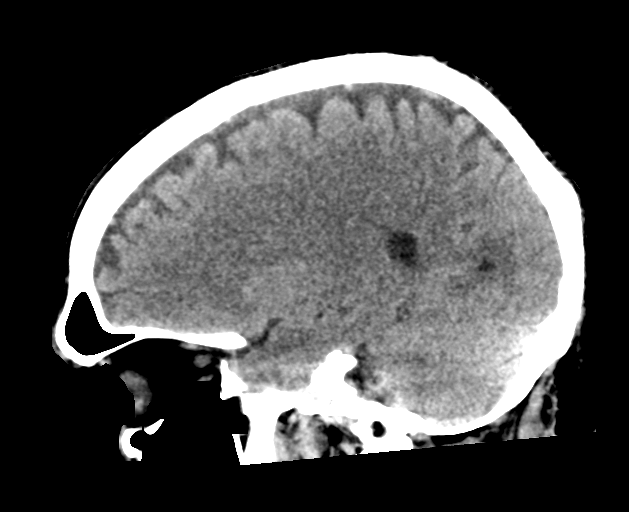
[im 33/66  brain]
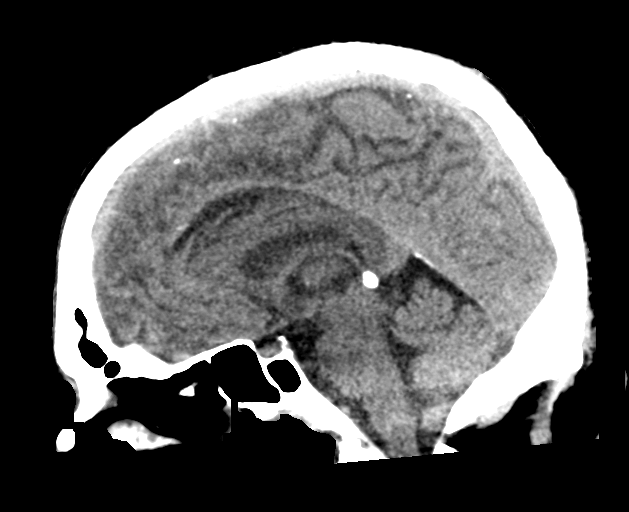
[im 40/66  brain]
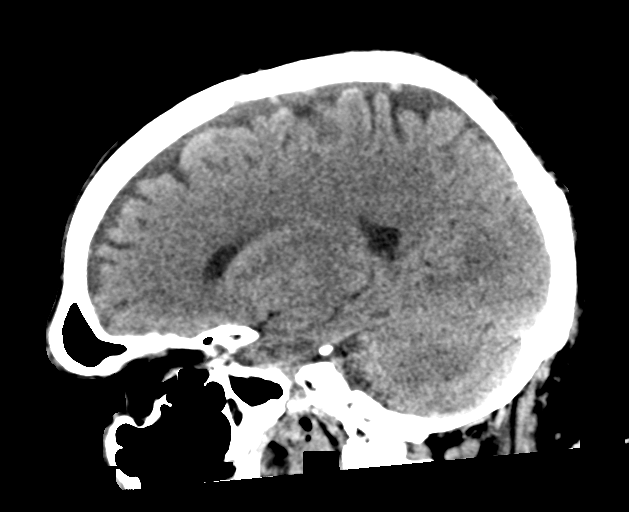

[16 of 47 positions shown; findings below may reference images not displayed]

FINDINGS: CT HEAD FINDINGS

Brain: There is no evidence for acute hemorrhage, hydrocephalus,
mass lesion, or abnormal extra-axial fluid collection. No definite
CT evidence for acute infarction.

Vascular: No hyperdense vessel or unexpected calcification.

Skull: No evidence for fracture. No worrisome lytic or sclerotic
lesion.

Sinuses/Orbits: Visualized paranasal sinuses are clear. Right
mastoid effusion is similar to prior.

Other: None.

CT CERVICAL SPINE FINDINGS

Alignment: Normal.

Skull base and vertebrae: No acute fracture. No primary bone lesion
or focal pathologic process.

Soft tissues and spinal canal: No prevertebral fluid or swelling. No
visible canal hematoma.

Disc levels: Loss of disc height with endplate degeneration noted
C6-7 and C7-T1.

Upper chest: Unremarkable.

Other: None.
IMPRESSION: 1. No acute intracranial abnormality.
2. No cervical spine fracture or subluxation.
3. Degenerative changes in the lower cervical spine.

## 2023-05-10 IMAGING — CT CT CERVICAL SPINE W/O CM
3 of 4 series · 13 of 33 positions shown, 16 images · non-contrast
Comparison: Head CT 11/21/2020.

CLINICAL DATA: Patient fell from standing.



[Series 8: c_spine 2.0 st · axial · 0.30mm/px · z∈[-222,-94]mm · 5 of 97 slices shown, 7 images]
[im 17/97  soft-tissue]
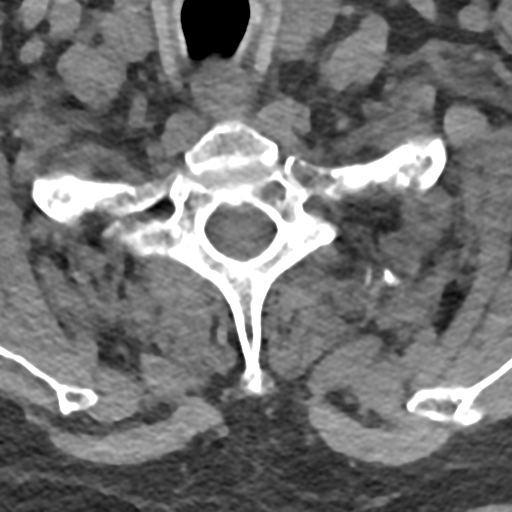
[im 17/97  bone]
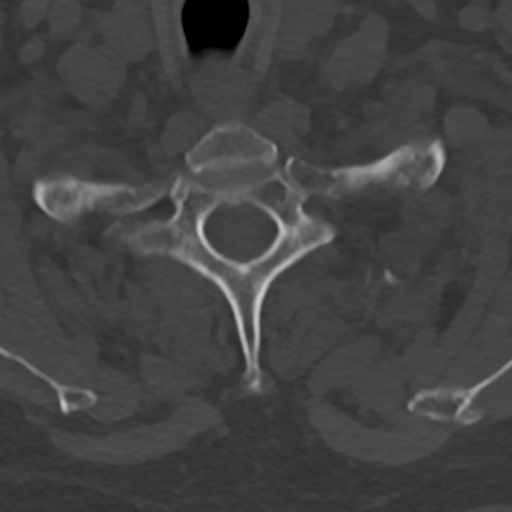
[im 33/97  bone]
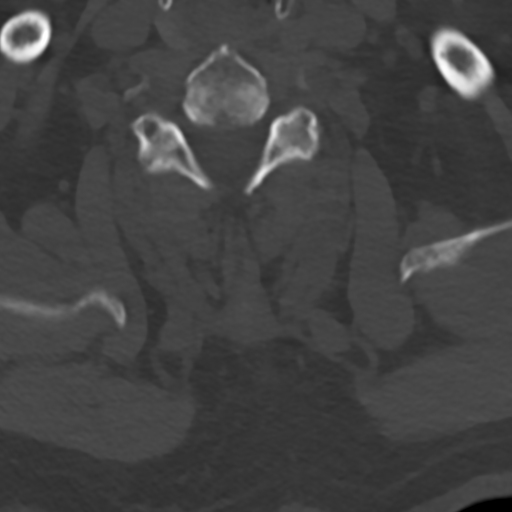
[im 49/97  bone]
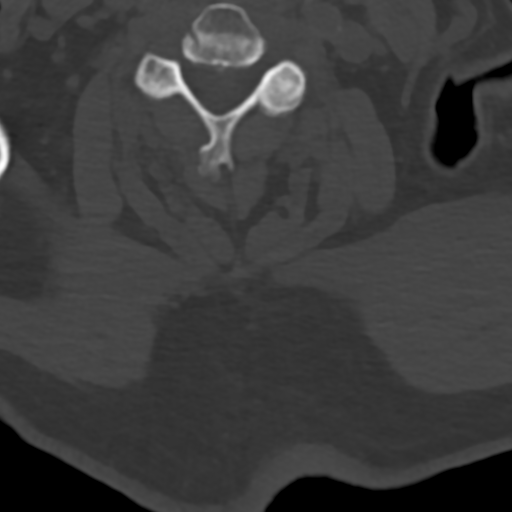
[im 65/97  bone]
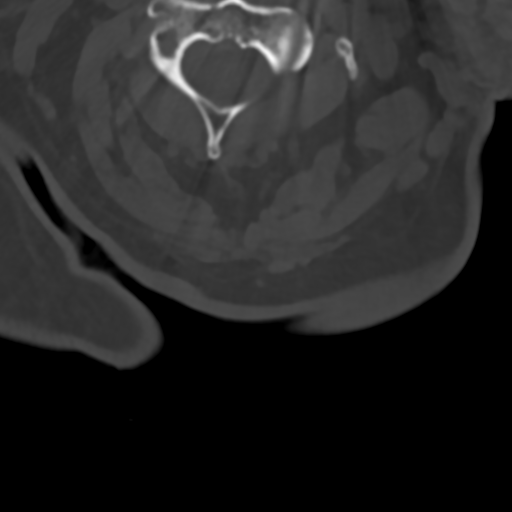
[im 81/97  soft-tissue]
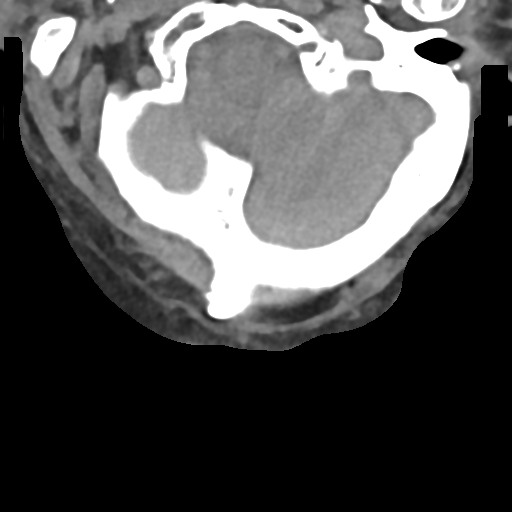
[im 81/97  bone]
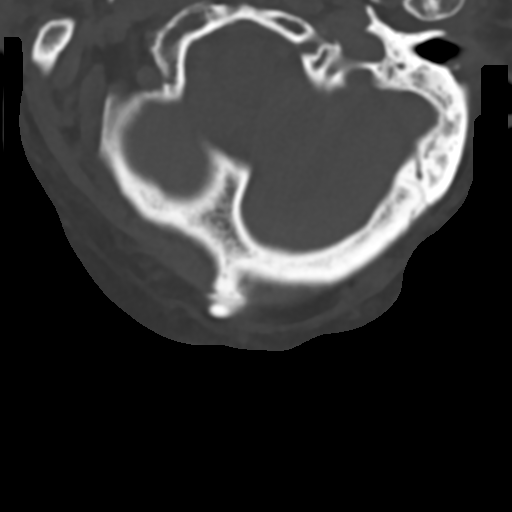

[Series 9: c_spine 2.0 sag bone · sagittal · 0.28mm/px · 5 of 61 slices shown, 6 images]
[im 21/61  bone]
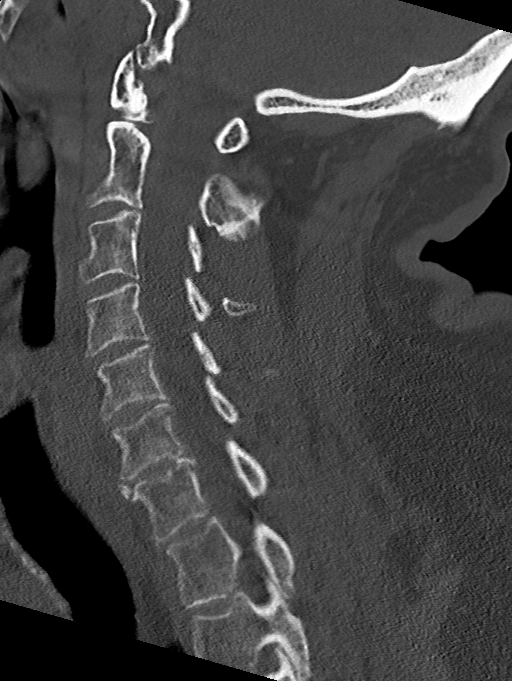
[im 26/61  bone]
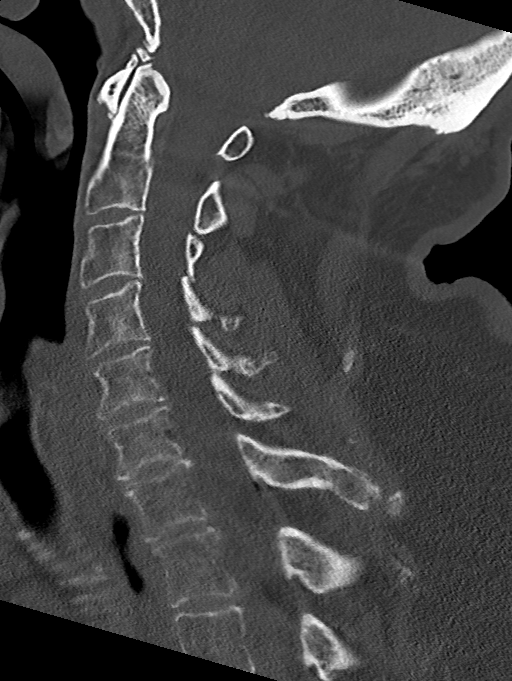
[im 31/61  soft-tissue]
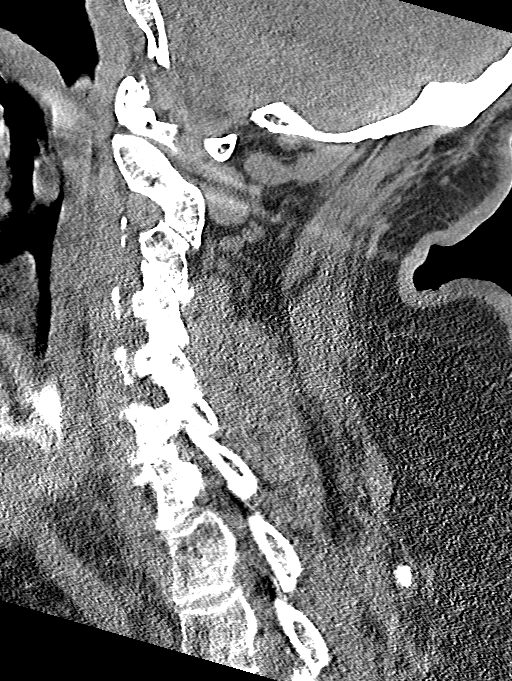
[im 31/61  bone]
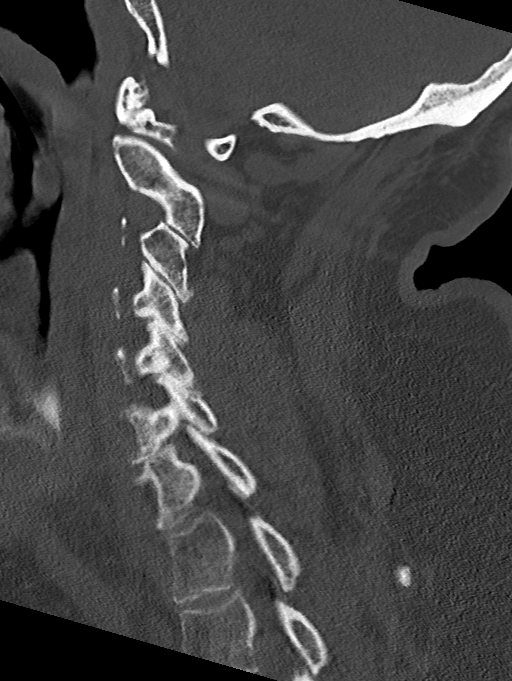
[im 36/61  bone]
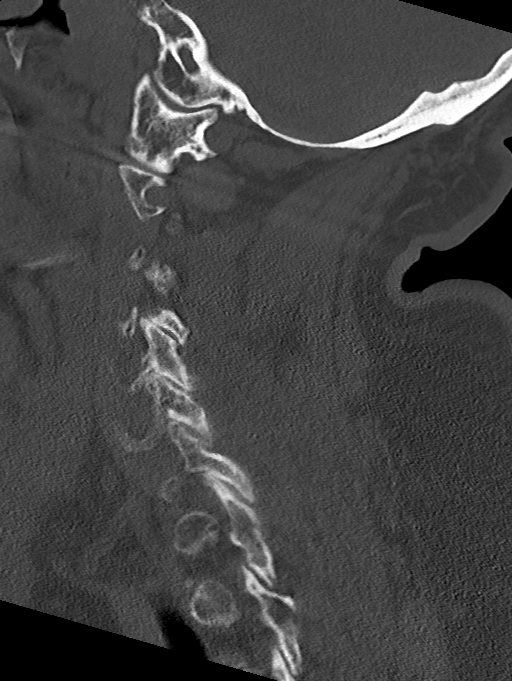
[im 41/61  bone]
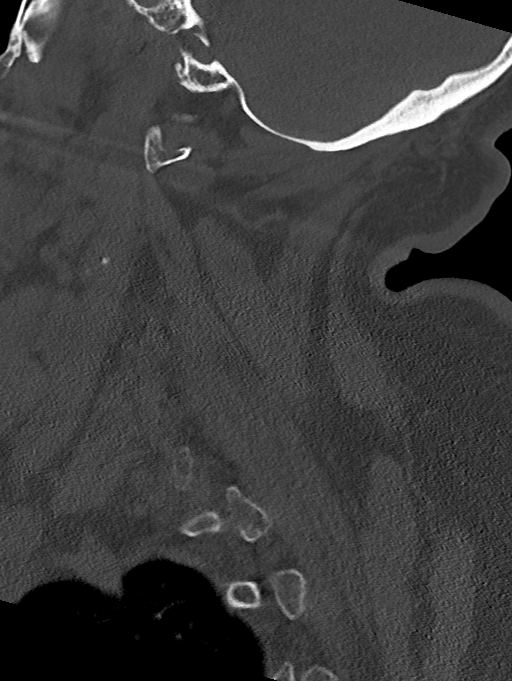

[Series 10: c_spine 2.0 cor bone · coronal · 0.28mm/px · 3 of 61 slices shown]
[im 13/61  bone]
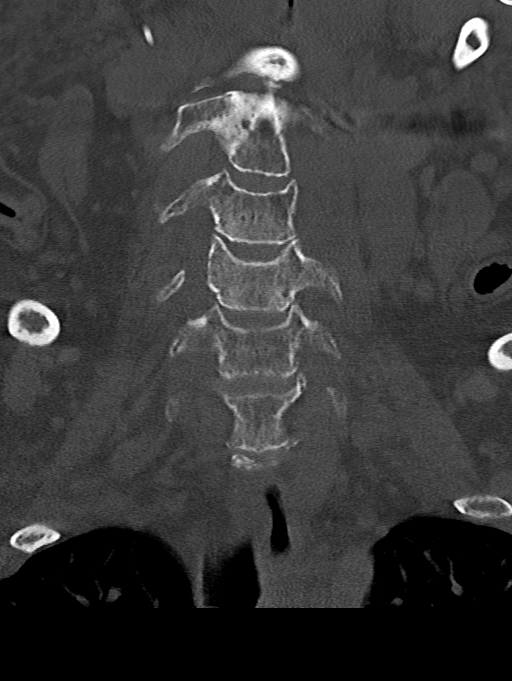
[im 25/61  bone]
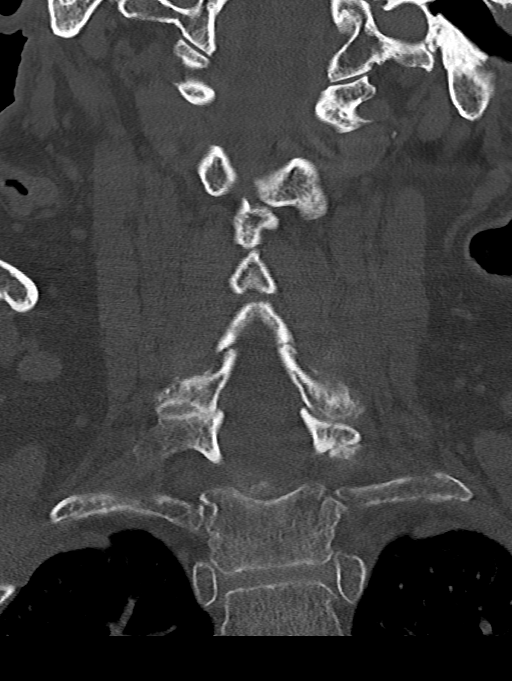
[im 37/61  bone]
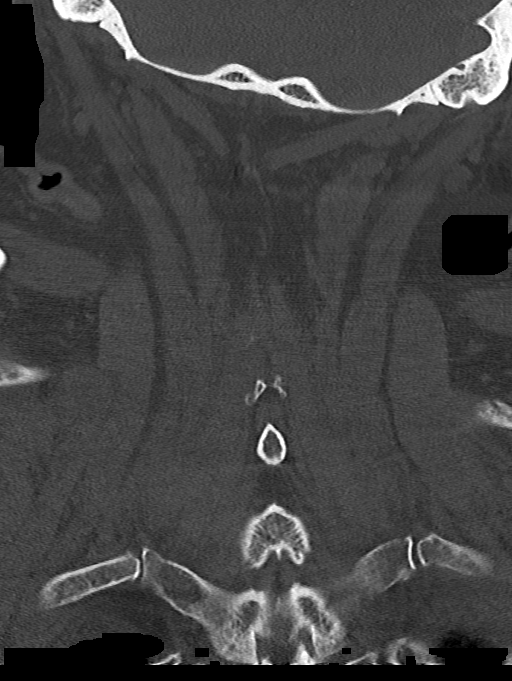

[13 of 33 positions shown; findings below may reference images not displayed]

FINDINGS: CT HEAD FINDINGS

Brain: There is no evidence for acute hemorrhage, hydrocephalus,
mass lesion, or abnormal extra-axial fluid collection. No definite
CT evidence for acute infarction.

Vascular: No hyperdense vessel or unexpected calcification.

Skull: No evidence for fracture. No worrisome lytic or sclerotic
lesion.

Sinuses/Orbits: Visualized paranasal sinuses are clear. Right
mastoid effusion is similar to prior.

Other: None.

CT CERVICAL SPINE FINDINGS

Alignment: Normal.

Skull base and vertebrae: No acute fracture. No primary bone lesion
or focal pathologic process.

Soft tissues and spinal canal: No prevertebral fluid or swelling. No
visible canal hematoma.

Disc levels: Loss of disc height with endplate degeneration noted
C6-7 and C7-T1.

Upper chest: Unremarkable.

Other: None.
IMPRESSION: 1. No acute intracranial abnormality.
2. No cervical spine fracture or subluxation.
3. Degenerative changes in the lower cervical spine.
